# Patient Record
Sex: Female | Born: 1960 | Race: White | Hispanic: No | Marital: Single | Smoking: Current every day smoker
Health system: Southern US, Community
[De-identification: ages and names within clinical notes are randomized; demographics above are authoritative.]

## PROBLEM LIST (undated history)

## (undated) DIAGNOSIS — R490 Dysphonia: Secondary | ICD-10-CM

## (undated) DIAGNOSIS — L089 Local infection of the skin and subcutaneous tissue, unspecified: Principal | ICD-10-CM

## (undated) DIAGNOSIS — Z8709 Personal history of other diseases of the respiratory system: Secondary | ICD-10-CM

## (undated) DIAGNOSIS — R42 Dizziness and giddiness: Secondary | ICD-10-CM

## (undated) DIAGNOSIS — D3500 Benign neoplasm of unspecified adrenal gland: Secondary | ICD-10-CM

## (undated) DIAGNOSIS — M791 Myalgia, unspecified site: Secondary | ICD-10-CM

## (undated) DIAGNOSIS — C8330 Diffuse large B-cell lymphoma, unspecified site: Principal | ICD-10-CM

## (undated) DIAGNOSIS — E079 Disorder of thyroid, unspecified: Secondary | ICD-10-CM

## (undated) DIAGNOSIS — M2550 Pain in unspecified joint: Secondary | ICD-10-CM

## (undated) DIAGNOSIS — M1990 Unspecified osteoarthritis, unspecified site: Secondary | ICD-10-CM

## (undated) DIAGNOSIS — K219 Gastro-esophageal reflux disease without esophagitis: Secondary | ICD-10-CM

## (undated) DIAGNOSIS — M2540 Effusion, unspecified joint: Secondary | ICD-10-CM

## (undated) DIAGNOSIS — J384 Edema of larynx: Secondary | ICD-10-CM

## (undated) DIAGNOSIS — C801 Malignant (primary) neoplasm, unspecified: Secondary | ICD-10-CM

## (undated) DIAGNOSIS — R350 Frequency of micturition: Secondary | ICD-10-CM

## (undated) HISTORY — DX: Diffuse large B-cell lymphoma, unspecified site: C83.30

## (undated) HISTORY — DX: Dysphonia: R49.0

## (undated) HISTORY — PX: BIOPSY THYROID: PRO38

## (undated) HISTORY — PX: LARYNGOSCOPY: SUR817

## (undated) HISTORY — DX: Local infection of the skin and subcutaneous tissue, unspecified: L08.9

## (undated) HISTORY — DX: Disorder of thyroid, unspecified: E07.9

## (undated) HISTORY — DX: Benign neoplasm of unspecified adrenal gland: D35.00

## (undated) HISTORY — DX: Edema of larynx: J38.4

## (undated) HISTORY — PX: LYMPH NODE BIOPSY: SHX201

---

## 1998-07-19 ENCOUNTER — Other Ambulatory Visit: Admission: RE | Admit: 1998-07-19 | Discharge: 1998-07-19 | Payer: Self-pay | Admitting: Obstetrics and Gynecology

## 1999-07-27 ENCOUNTER — Other Ambulatory Visit: Admission: RE | Admit: 1999-07-27 | Discharge: 1999-07-27 | Payer: Self-pay | Admitting: Obstetrics and Gynecology

## 1999-09-07 ENCOUNTER — Other Ambulatory Visit: Admission: RE | Admit: 1999-09-07 | Discharge: 1999-09-07 | Payer: Self-pay | Admitting: Obstetrics and Gynecology

## 1999-09-07 ENCOUNTER — Encounter (INDEPENDENT_AMBULATORY_CARE_PROVIDER_SITE_OTHER): Payer: Self-pay

## 1999-10-29 HISTORY — PX: ABDOMINAL HYSTERECTOMY: SHX81

## 2000-04-23 ENCOUNTER — Inpatient Hospital Stay (HOSPITAL_COMMUNITY): Admission: RE | Admit: 2000-04-23 | Discharge: 2000-04-25 | Payer: Self-pay | Admitting: Obstetrics and Gynecology

## 2000-04-23 ENCOUNTER — Encounter (INDEPENDENT_AMBULATORY_CARE_PROVIDER_SITE_OTHER): Payer: Self-pay

## 2000-08-22 ENCOUNTER — Other Ambulatory Visit: Admission: RE | Admit: 2000-08-22 | Discharge: 2000-08-22 | Payer: Self-pay | Admitting: Obstetrics and Gynecology

## 2003-11-01 ENCOUNTER — Other Ambulatory Visit: Admission: RE | Admit: 2003-11-01 | Discharge: 2003-11-01 | Payer: Self-pay | Admitting: Obstetrics and Gynecology

## 2004-02-27 ENCOUNTER — Other Ambulatory Visit: Admission: RE | Admit: 2004-02-27 | Discharge: 2004-02-27 | Payer: Self-pay | Admitting: Diagnostic Radiology

## 2004-11-07 ENCOUNTER — Other Ambulatory Visit: Admission: RE | Admit: 2004-11-07 | Discharge: 2004-11-07 | Payer: Self-pay | Admitting: Obstetrics and Gynecology

## 2004-12-24 ENCOUNTER — Encounter (INDEPENDENT_AMBULATORY_CARE_PROVIDER_SITE_OTHER): Payer: Self-pay | Admitting: *Deleted

## 2004-12-24 ENCOUNTER — Ambulatory Visit (HOSPITAL_COMMUNITY): Admission: RE | Admit: 2004-12-24 | Discharge: 2004-12-24 | Payer: Self-pay | Admitting: General Surgery

## 2005-11-13 ENCOUNTER — Other Ambulatory Visit: Admission: RE | Admit: 2005-11-13 | Discharge: 2005-11-13 | Payer: Self-pay | Admitting: Obstetrics and Gynecology

## 2006-12-05 ENCOUNTER — Other Ambulatory Visit: Admission: RE | Admit: 2006-12-05 | Discharge: 2006-12-05 | Payer: Self-pay | Admitting: Obstetrics and Gynecology

## 2007-12-15 ENCOUNTER — Other Ambulatory Visit: Admission: RE | Admit: 2007-12-15 | Discharge: 2007-12-15 | Payer: Self-pay | Admitting: Obstetrics and Gynecology

## 2009-05-11 ENCOUNTER — Other Ambulatory Visit
Admission: RE | Admit: 2009-05-11 | Discharge: 2009-05-11 | Payer: Self-pay | Admitting: Physical Medicine & Rehabilitation

## 2010-05-21 ENCOUNTER — Other Ambulatory Visit: Admission: RE | Admit: 2010-05-21 | Discharge: 2010-05-21 | Payer: Self-pay | Admitting: Obstetrics and Gynecology

## 2010-11-18 ENCOUNTER — Encounter: Payer: Self-pay | Admitting: Family Medicine

## 2011-03-15 NOTE — H&P (Signed)
[facility]  Patient:    Alexandra Allen, Alexandra Allen                  MRN: [id number] Adm. Date:  04/23/00 Attending:  Beather Arbour. Thomasena Edis, M.D.                         History and Physical  DATE OF BIRTH:                23-Jun-1961  SS#:                          [id number]  HISTORY OF PRESENT ILLNESS:   The patient is a 50 year old Gravida 0 with a long history of menorrhagia.  The patient has been diagnosed with multiple uterine fibroids.  She most recently presented to the office complaining of something protruding from her vagina at which time she was diagnosed with a prolapsed uterine fibroid.  Menorrhagia has resulted in the patient manifesting anemia.  Her last hemoglobin in the office was 10.8 on iron supplementation.  She is currently taking Micronor and Lupron to control the bleeding.  The patient has been treated conservatively with Micronor but has continued to manifest profuse bleeding.  She had complained of increased bleeding with venipuncture but had coagulation studies performed by Dr. Maple Hudson which were found to be within normal limits.  Risks of surgery including anesthetic complication, hemorrhage, infection, damage to adjacent structures including bladder, bowel, blood vessels or ureters were discussed with the patient.  She was made aware of unforeseen risks.  She was also made aware of postoperative risks to include, DVT, pulmonary embolism, wound infection, urinary tract infection, atelectasis and pneumonia as well as other unforeseen postoperative risks. I have further discussed with her the possibility of vesicovaginal fistula and the etiology of this.  The patient expresses understanding of and acceptance of these risks and desires to proceed with total abdominal hysterectomy for severe menorrhagia uncontrolled with oral contraceptives and with Lupron GNRA agonist.  The patient had been previously treated with a combination of  oral contraceptives but at 37 these were discontinued due to the patients smoking history.  PAST MEDICAL HISTORY:         As above.  SOCIAL HISTORY:               The patient is an Airline pilot.  HABITS:                       The patient smokes 3/4 of a pack of cigarettes per day.  ALLERGIES:                    Septra.  MEDICATIONS:                  Chromagen and Lupron.  FAMILY HISTORY:               History of lung cancer in maternal grandfather and hypertension in the patients father.  Also history of postmenopausal breast cancer in the patients mother.  History of colon cancer in the maternal grandmother.  The patients father also has borderline glucose intolerance.  REVIEW OF SYSTEMS:            Negative.  PHYSICAL EXAMINATION:  HEENT:  Normal.  NECK:                         Supple without thyromegaly.  LUNGS:                        Clear to auscultation.  CARDIAC:                      Regular rate and rhythm.  ABDOMEN:                      Soft, nontender.  No hepatosplenomegaly.  PELVIC:                       BUS normal. Speculum examination reveals prolapsing uterine fibroid.  Uterus is approximately eight weeks and mobile. There are no adnexal masses palpated.  RECTAL:                       Confirmatory.  No mass.  LABORATORY DATA:              Pap smear performed on July 27, 1999 was within normal limits.  Endometrial biopsy performed on September 07, 1999 showed proliferative type endometrium.  ASSESSMENT/PLAN:              The patient is a 50 year old, Gravida 0, with menorrhagia and anemia unresponsive to Micronor oral contraceptive and Lupron. The patient is admitted for a total abdominal hysterectomy.  Decision was made for total abdominal hysterectomy since there is absolutely no descend of the uterus and the prolapsing fibroid would obscure the ability to remove the uterus vaginally.  The patient expressed understanding of and  accepts the risks of surgery. DD:  04/23/00 TD:  04/23/00 Job: [id number]

## 2011-03-15 NOTE — Op Note (Signed)
NAMEKEYONI, Alexandra Allen         ACCOUNT NO.:  [id number]   MEDICAL RECORD NO.:  [id number]          PATIENT TYPE:  AMB   LOCATION:  DAY                          FACILITY:  [facility]   PHYSICIAN:  Sharlet Salina T. Hoxworth, M.D.DATE OF BIRTH:  Nov 26, 1960   DATE OF PROCEDURE:  12/24/2004  DATE OF DISCHARGE:                                 OPERATIVE REPORT   PREOPERATIVE DIAGNOSIS:  Left axillary lymphadenopathy.   POSTOPERATIVE DIAGNOSIS:  Left axillary lymphadenopathy.   SURGICAL PROCEDURE:  Excisional biopsy, deep left axillary lymph node.   SURGEON:  Dr. Johna Sheriff   ANESTHESIA:  Local with IV sedation.   BRIEF HISTORY:  Alexandra Allen is a 50 year old female, who approximately 2  months ago noted a palpable lymph node under her left arm.  She has  undergone an extensive work-up with detailed breast imaging and no other  abnormalities found, although a 1.5 cm enlarged lymph node was seen in the  left axilla.  This has been followed clinically for about 2 months and has  not regressed and after a discussion of options with the patient, we have  elected to proceed with excisional biopsy.  The indications for the  procedure and risks of bleeding, infection, small risk of lymphedema were  discussed and understood.  She is now brought to the operating room for this  procedure.   DESCRIPTION OF OPERATION:  The patient brought to the operating room and  placed in supine position on the operating table, and IV sedation was  administered.  Left arm was carefully positioned, extended on arm board, and  the left chest, axilla, and arm were sterilely prepped and draped.  Local  anesthesia was used to infiltrate the skin and underlying soft tissue in the  left axilla.  A small transverse incision was made and dissection carried  down through the subcutaneous tissue.  Dissection was deepened down to the  clavipectoral fascia which was incised and the axilla proper entered.  The  lymph node was  easily palpable against the chest wall.  Dissection was  carried directly down onto the node which was elevated and then sharply  dissected away from surrounding tissue on the capsule and node.  Dissection  was carried down to the pedicle which was then clamped, divided, and the  specimen removed, the pedicle suture ligated with 3-0 Vicryl.  I then  carefully felt through the incision in the axilla and was not able to feel  any other abnormal lymph nodes.  Hemostasis was assured.  The deep subcu was  closed with a couple of interrupted 3-0 Vicryl.  The skin was closed with  running subcuticular 4-0 Monocryl and Steri-Strips.  Sponge, needle, and  instrument counts were correct.  Dry sterile dressing was applied and the  patient taken to recovery in good condition.      BTH/MEDQ  D:  12/24/2004  T:  12/24/2004  Job:  [id number]

## 2011-03-15 NOTE — Op Note (Signed)
[facility]  Patient:    Alexandra Allen, Alexandra Allen                  MRN: [id number] Proc. Date: 04/23/00 Adm. Date:  [id number] Attending:  Madelyn Flavors                           Operative Report  PREOPERATIVE DIAGNOSIS:       Fibroid uterus, menorrhagia, and anemia. Prolapsing fibroid into the vagina.  POSTOPERATIVE DIAGNOSIS:      Fibroid uterus, menorrhagia, and anemia. Prolapsing fibroid into the vagina.  OPERATION:                    Total abdominal hysterectomy and lysis of adhesions.  SURGEON:                      Beather Arbour. Thomasena Edis, M.D.  ASSISTANT:                    Trevor Iha, M.D.  ANESTHESIA:                   General endotracheal anesthesia.  ESTIMATED BLOOD LOSS:         100 cc.  FLUIDS:                       2100 cc of Crystalloid and 1 gram of Ancef IV preoperative antibiotic.  DRAINS:                       Foley.  COMPLICATIONS:                None.  DESCRIPTION OF PROCEDURE:     The patient was brought to the operating room and  identified on the operating table.  After induction of adequate general endotracheal anesthesia, the patient was placed in the supine position and prepped and draped in the usual sterile fashion.  A Foley catheter was placed.  A Pfannenstiel incision was made and carried down to the fascia.  The fascia was scored in the midline and extended bilaterally using Mayo scissors.  It was then separated free from the underlying muscles.  The muscles were separated in the midline down to the symphysis.  The peritoneum was entered sharply and carefully after placing the patient in Trendelenburg.  Entry into the abdomen was performed carefully to decrease the risk of bowel damage.  The peritoneal incision was extended superiorly and then inferiorly down to the bladder edge.  The OConnor-OSullivan retractor was placed and the bladder blade was placed.  The bowel was packed out of the operative  field.  There were noted to be extensive adhesions of the bowel to the left pelvic side wall and to the left tube and ovary and these were lysed using sharp dissection.  Kelly clamps were placed at either angle of the uterus and the uterus was elevated up to the level of the abdominal incision.  The right round ligament was ligated with a figure-of-eight suture of 0 Monocryl, separated using cautery, and the anterior leaf of the broad ligament as separated down to the area overlying the internal os.  The clear space was developed and the utero-ovarian ligament was clamped with a Heaney, cut, tied with a free tie, and then a suture ligature of 0 Monocryl.  The vessels were skeletonized.  A curved Heaney  was then placed to clamp the uterine vessels which were then cut and suture ligated using a suture of 0 Monocryl.  The bladder was  taken down.  In a similar fashion, the left round ligament was ligated with a figure-of-eight suture of 0 Monocryl, divided, and the anterior leaf of the broad ligament was undermined to the area overlying the internal os.  The bladder was  noted to be well down.  The utero-ovarian ligament pedicle was clamped with a Heaney, cut, tied with a free tie, and then a suture ligature of 0 Monocryl. The vessels were skeletonized on the left and the uterine vessels were clamped with a curved Heaney, cut, and suture ligated using a suture of 0 Monocryl.  After noting that the bladder was well down, successive cardinal uterosacral ligament complex bites were taken using straight Heaney clamps with the pedicle then cut and suture ligated using sutures of 0 Monocryl.  The curved Heaney was then placed across he left uterosacral ligament, clamped, cut, and suture ligated using a suture of 0  Monocryl.  The vagina was then entered.  In a similar fashion, the right uterosacral ligament was clamped with a curved Heaney, cut, and suture ligated using  figure-of-eight suture of 0 Monocryl and the vagina was entered.  The Satinsky scissors were then used to separate the cervix from the vagina and the  uterus was sent to pathology for examination.  There was noted to be a large prolapsed fibroid which was prolapsed into the vagina.  Using figure-of-eight sutures of 0 Monocryl, the vaginal cuff was closed.  Excellent hemostasis was noted at all pedicles and the pelvis was copiously irrigated with warm Ringers lactate. A suture of 0 Monocryl was placed to plicate the uterosacral ligaments and provide further support and to decrease the risk of enterocele.  All pedicle sites were  again examined and they were noted to be extremely hemostatic.  At that point, he procedure was then terminated.  The patient was taken out of Trendelenburg, the  packs packing the bowel out of the operative field were removed.  The OConnor-OSullivan retractor and the bladder blade as well as abdominal wall retractor were removed.  Kelly clamps were placed on the peritoneum and the peritoneum was closed using suture of 0 Monocryl.  The subfascial areas were examined for evidence of hemostasis and excellent hemostasis achieved using cautery.  The fascia was closed using a suture of 0 PDS anchored at the leftmost angle of the incision, run to the rightmost angle of the incision in a simple running fashion and tied.  The subcutaneous tissue was irrigated copiously with  warm Ringers lactate and excellent hemostasis achieved using cautery and skin was closed with staples.  The patient tolerated the procedure well without apparent  complications and was transferred to the recovery room in stable condition after all sponge, needle, and instrument counts were correct. DD:  04/23/00 TD:  04/24/00 Job: [id number]

## 2011-03-15 NOTE — Discharge Summary (Signed)
[facility]  Patient:    Alexandra Allen, Alexandra Allen                  MRN: [id number] Adm. Date:  [id number] Disc. Date: [id number] Attending:  Madelyn Flavors                           Discharge Summary  HISTORY OF PRESENT ILLNESS:   The patient is a 50 year old gravida 0, with a long history of menorrhagia diagnosed with multiple uterine fibroids.  She was subsequently found to have a large prolapsing fibroid.  For further details, please see the dictated history and physical.  HOSPITAL COURSE:              The patient was admitted and subsequently underwent a total abdominal hysterectomy without difficulty.  Postoperatively, she was receiving excellent analgesia and was able to ambulate.  On postoperative day #1, her hemoglobin was 9.7, dressing was clean and dry over the incision.  She was also complaining of some increased gaseous distention for which she received a Dulcolax suppository.  On postoperative day #2, the patient had no complaints, incision was clean and dry.  She had very scant vaginal bleeding and was discharged home.  She was urged to return for an incision check in two weeks.  Given Ultram 50 mg, dispense 30, to take 1-2 p.o. q.4-6h. p.r.n. pain.  She was urged to do no heavy lifting.  Call if heavy vaginal bleeding, temperature greater than 100.5 persistently, or any problems.  PATHOLOGY:                    Pathology returned as prolapsing submucosal endometrial leiomyoma and leiomyomata uteri. DD:  06/02/00 TD:  06/02/00 Job: [id number]

## 2013-08-27 ENCOUNTER — Other Ambulatory Visit: Payer: Self-pay | Admitting: Family Medicine

## 2013-08-30 ENCOUNTER — Other Ambulatory Visit: Payer: Self-pay | Admitting: Family Medicine

## 2013-08-30 DIAGNOSIS — R1909 Other intra-abdominal and pelvic swelling, mass and lump: Secondary | ICD-10-CM

## 2013-09-07 ENCOUNTER — Other Ambulatory Visit: Payer: Self-pay | Admitting: Family Medicine

## 2013-09-07 DIAGNOSIS — E041 Nontoxic single thyroid nodule: Secondary | ICD-10-CM

## 2013-09-08 ENCOUNTER — Other Ambulatory Visit: Payer: Self-pay

## 2013-09-10 ENCOUNTER — Ambulatory Visit
Admission: RE | Admit: 2013-09-10 | Discharge: 2013-09-10 | Disposition: A | Discharge status: Home / Self Care | Payer: BC Managed Care – PPO | Source: Ambulatory Visit | Attending: Family Medicine | Admitting: Family Medicine

## 2013-09-10 DIAGNOSIS — E041 Nontoxic single thyroid nodule: Secondary | ICD-10-CM

## 2013-09-10 DIAGNOSIS — R1909 Other intra-abdominal and pelvic swelling, mass and lump: Secondary | ICD-10-CM

## 2013-09-27 ENCOUNTER — Ambulatory Visit (INDEPENDENT_AMBULATORY_CARE_PROVIDER_SITE_OTHER): Payer: BC Managed Care – PPO | Admitting: Surgery

## 2013-09-27 ENCOUNTER — Ambulatory Visit (INDEPENDENT_AMBULATORY_CARE_PROVIDER_SITE_OTHER): Payer: Self-pay | Admitting: Surgery

## 2013-10-27 ENCOUNTER — Encounter (INDEPENDENT_AMBULATORY_CARE_PROVIDER_SITE_OTHER): Payer: Self-pay

## 2013-11-04 ENCOUNTER — Ambulatory Visit (INDEPENDENT_AMBULATORY_CARE_PROVIDER_SITE_OTHER): Payer: BC Managed Care – PPO | Admitting: General Surgery

## 2013-11-29 ENCOUNTER — Ambulatory Visit (INDEPENDENT_AMBULATORY_CARE_PROVIDER_SITE_OTHER): Payer: BC Managed Care – PPO | Admitting: General Surgery

## 2014-01-03 ENCOUNTER — Ambulatory Visit (INDEPENDENT_AMBULATORY_CARE_PROVIDER_SITE_OTHER): Payer: BC Managed Care – PPO | Admitting: General Surgery

## 2014-02-21 ENCOUNTER — Ambulatory Visit (INDEPENDENT_AMBULATORY_CARE_PROVIDER_SITE_OTHER): Payer: BC Managed Care – PPO | Admitting: General Surgery

## 2014-09-06 ENCOUNTER — Other Ambulatory Visit: Payer: BC Managed Care – PPO

## 2014-09-06 ENCOUNTER — Other Ambulatory Visit: Payer: Self-pay | Admitting: Family Medicine

## 2014-09-06 DIAGNOSIS — R1084 Generalized abdominal pain: Secondary | ICD-10-CM

## 2014-09-06 DIAGNOSIS — R591 Generalized enlarged lymph nodes: Secondary | ICD-10-CM

## 2014-09-08 ENCOUNTER — Ambulatory Visit
Admission: RE | Admit: 2014-09-08 | Discharge: 2014-09-08 | Disposition: A | Discharge status: Home / Self Care | Payer: BC Managed Care – PPO | Source: Ambulatory Visit | Attending: Family Medicine | Admitting: Family Medicine

## 2014-09-08 ENCOUNTER — Other Ambulatory Visit: Payer: BC Managed Care – PPO

## 2014-09-08 DIAGNOSIS — R1084 Generalized abdominal pain: Secondary | ICD-10-CM

## 2014-09-08 DIAGNOSIS — R591 Generalized enlarged lymph nodes: Secondary | ICD-10-CM

## 2014-09-13 ENCOUNTER — Other Ambulatory Visit: Payer: BC Managed Care – PPO

## 2014-09-13 ENCOUNTER — Ambulatory Visit
Admission: RE | Admit: 2014-09-13 | Discharge: 2014-09-13 | Disposition: A | Discharge status: Home / Self Care | Payer: BC Managed Care – PPO | Source: Ambulatory Visit | Attending: Family Medicine | Admitting: Family Medicine

## 2014-09-13 DIAGNOSIS — R591 Generalized enlarged lymph nodes: Secondary | ICD-10-CM

## 2014-09-13 DIAGNOSIS — R1084 Generalized abdominal pain: Secondary | ICD-10-CM

## 2014-09-14 ENCOUNTER — Other Ambulatory Visit: Payer: Self-pay | Admitting: Family Medicine

## 2014-09-14 DIAGNOSIS — R160 Hepatomegaly, not elsewhere classified: Secondary | ICD-10-CM

## 2014-09-21 ENCOUNTER — Ambulatory Visit
Admission: RE | Admit: 2014-09-21 | Discharge: 2014-09-21 | Disposition: A | Discharge status: Home / Self Care | Payer: BC Managed Care – PPO | Source: Ambulatory Visit | Attending: Family Medicine | Admitting: Family Medicine

## 2014-09-21 DIAGNOSIS — R160 Hepatomegaly, not elsewhere classified: Secondary | ICD-10-CM

## 2014-09-21 MED ORDER — GADOBENATE DIMEGLUMINE 529 MG/ML IV SOLN
14.0000 mL | Freq: Once | INTRAVENOUS | Status: AC | PRN
  Administered 2014-09-21: 14 mL via INTRAVENOUS

## 2014-09-30 ENCOUNTER — Other Ambulatory Visit (INDEPENDENT_AMBULATORY_CARE_PROVIDER_SITE_OTHER): Payer: Self-pay | Admitting: General Surgery

## 2014-09-30 DIAGNOSIS — R1909 Other intra-abdominal and pelvic swelling, mass and lump: Secondary | ICD-10-CM

## 2014-10-04 ENCOUNTER — Other Ambulatory Visit (INDEPENDENT_AMBULATORY_CARE_PROVIDER_SITE_OTHER): Payer: Self-pay | Admitting: General Surgery

## 2014-10-19 ENCOUNTER — Other Ambulatory Visit: Payer: Self-pay | Admitting: Radiology

## 2014-10-20 ENCOUNTER — Other Ambulatory Visit: Payer: Self-pay | Admitting: Radiology

## 2014-10-24 ENCOUNTER — Ambulatory Visit (HOSPITAL_COMMUNITY)
Admission: RE | Admit: 2014-10-24 | Discharge: 2014-10-24 | Disposition: A | Discharge status: Home / Self Care | Payer: BC Managed Care – PPO | Source: Ambulatory Visit | Attending: General Surgery | Admitting: General Surgery

## 2014-10-24 ENCOUNTER — Other Ambulatory Visit (INDEPENDENT_AMBULATORY_CARE_PROVIDER_SITE_OTHER): Payer: Self-pay | Admitting: General Surgery

## 2014-10-24 DIAGNOSIS — Z808 Family history of malignant neoplasm of other organs or systems: Secondary | ICD-10-CM | POA: Insufficient documentation

## 2014-10-24 DIAGNOSIS — E042 Nontoxic multinodular goiter: Secondary | ICD-10-CM | POA: Diagnosis not present

## 2014-10-24 DIAGNOSIS — R1909 Other intra-abdominal and pelvic swelling, mass and lump: Secondary | ICD-10-CM

## 2014-10-24 DIAGNOSIS — Z803 Family history of malignant neoplasm of breast: Secondary | ICD-10-CM | POA: Insufficient documentation

## 2014-10-24 DIAGNOSIS — Z8042 Family history of malignant neoplasm of prostate: Secondary | ICD-10-CM | POA: Insufficient documentation

## 2014-10-24 DIAGNOSIS — R590 Localized enlarged lymph nodes: Secondary | ICD-10-CM | POA: Insufficient documentation

## 2014-10-24 DIAGNOSIS — F17210 Nicotine dependence, cigarettes, uncomplicated: Secondary | ICD-10-CM | POA: Insufficient documentation

## 2014-10-24 DIAGNOSIS — E079 Disorder of thyroid, unspecified: Secondary | ICD-10-CM | POA: Diagnosis present

## 2014-10-24 LAB — CBC
HCT: 44.4 % (ref 36.0–46.0)
Hemoglobin: 14.8 g/dL (ref 12.0–15.0)
MCH: 31.6 pg (ref 26.0–34.0)
MCHC: 33.3 g/dL (ref 30.0–36.0)
MCV: 94.9 fL (ref 78.0–100.0)
PLATELETS: 267 10*3/uL (ref 150–400)
RBC: 4.68 MIL/uL (ref 3.87–5.11)
RDW: 14 % (ref 11.5–15.5)
WBC: 5.7 10*3/uL (ref 4.0–10.5)

## 2014-10-24 LAB — PROTIME-INR
INR: 0.89 (ref 0.00–1.49)
Prothrombin Time: 12.1 seconds (ref 11.6–15.2)

## 2014-10-24 LAB — APTT: APTT: 31 s (ref 24–37)

## 2014-10-24 MED ORDER — SODIUM CHLORIDE 0.9 % IV SOLN
INTRAVENOUS | Status: DC
  Administered 2014-10-24: 09:00:00 via INTRAVENOUS

## 2014-10-24 MED ORDER — LIDOCAINE HCL (PF) 1 % IJ SOLN
INTRAMUSCULAR | Status: AC
  Filled 2014-10-24: qty 10

## 2014-10-24 NOTE — H&P (Signed)
Chief Complaint: "I'm here for a biopsy"  Referring Physician(s): Allen,Alexandra  History of Present Illness: .  Past Medical History  Diagnosis Date  . Thyroid disease     multiple nodules    Past Surgical History  Procedure Laterality Date  . Abdominal hysterectomy  2001    Allergies: Review of patient's allergies indicates not on file.  Medications: Prior to Admission medications   Medication Sig Start Date End Date Taking? Authorizing Provider  Glucosamine-Chondroit-Vit C-Mn (GLUCOSAMINE CHONDR 1500 COMPLX PO) Take by mouth.    Historical Provider, MD  ibuprofen (ADVIL) 200 MG tablet Take 200 mg by mouth every 6 (six) hours as needed.    Historical Provider, MD  Multiple Vitamins-Minerals (MULTIVITAMIN PO) Take by mouth daily.    Historical Provider, MD  promethazine-codeine (PHENERGAN WITH CODEINE) 6.25-10 MG/5ML syrup Take by mouth at bedtime as needed for cough.    Historical Provider, MD    Family History  Problem Relation Age of Onset  . Cancer Mother     breast  . Hypertension Mother   . Cancer Father     colon, skin and prostate  . Hypertension Father   . Heart disease Father     History   Social History  . Marital Status: Single    Spouse Name: N/A    Number of Children: N/A  . Years of Education: N/A   Social History Main Topics  . Smoking status: Current Every Day Smoker -- 1.50 packs/day    Types: Cigarettes  . Smokeless tobacco: Not on file  . Alcohol Use: Yes     Comment: 1-2 drinks daily  . Drug Use: No  . Sexual Activity: Yes   Other Topics Concern  . Not on file   Social History Narrative  . No narrative on file      Review of Systems  Constitutional: Negative for fever and chills.  Respiratory: Positive for cough. Negative for shortness of breath.   Cardiovascular: Negative for chest pain.  Gastrointestinal: Negative for nausea, vomiting and blood in stool.       Occ mild abd pain  Genitourinary: Negative for dysuria  and hematuria.  Musculoskeletal:       Occ back pain  Neurological: Positive for headaches.  Hematological: Does not bruise/bleed easily.    Vital Signs: BP 117/64 mmHg  Pulse 68  Temp(Src) 98.2 F (36.8 C) (Oral)  Resp 18  Ht 5' 3.75" (1.619 m)  Wt 115 lb (52.164 kg)  BMI 19.90 kg/m2  SpO2 100%  Physical Exam  Constitutional: She is oriented to person, place, and time. She appears well-developed and well-nourished.  Cardiovascular: Normal rate and regular rhythm.   Pulmonary/Chest: Effort normal and breath sounds normal.  Abdominal: Soft. Bowel sounds are normal. There is no tenderness.  Palpable,NT left groin ST/? nodal mass left groin region  Musculoskeletal: Normal range of motion. She exhibits no edema.  Neurological: She is alert and oriented to person, place, and time.    Imaging: No results found.  Labs:  CBC:  Recent Labs  10/24/14 0838  WBC 5.7  HGB 14.8  HCT 44.4  PLT 267    COAGS:  Recent Labs  10/24/14 0838  INR 0.89  APTT 31    BMP: No results for input(s): NA, K, CL, CO2, GLUCOSE, BUN, CALCIUM, CREATININE, GFRNONAA, GFRAA in the last 8760 hours.  Invalid input(s): CMP  LIVER FUNCTION TESTS: No results for input(s): BILITOT, AST, ALT, ALKPHOS, PROT, ALBUMIN in the last  8760 hours.  TUMOR MARKERS: No results for input(s): AFPTM, CEA, CA199, CHROMGRNA in the last 8760 hours.  Assessment and Plan: Alexandra Allen is a 53 y.o. female smoker with history of slightly enlarging/mildly hypervascular soft tissue mass in left groin region who presents today for US guided biopsy. She denies personal hx of cancer but family hx positive for breast/colon/prostate/skin cancers. Details/risks of procedure d/w pt with her understanding and consent.      SignedAutumn Allen 10/24/2014, 9:46 AM

## 2014-10-24 NOTE — Procedures (Signed)
Interventional Radiology Procedure Note  Procedure: US guided left inguinal lymph node biopsy  Complications: None Recommendations: - routine care - follow up bx result  Signed,  Dulcy Fanny. Earleen Newport, DO

## 2014-10-28 DIAGNOSIS — C801 Malignant (primary) neoplasm, unspecified: Secondary | ICD-10-CM

## 2014-10-28 HISTORY — DX: Malignant (primary) neoplasm, unspecified: C80.1

## 2014-11-01 ENCOUNTER — Telehealth (INDEPENDENT_AMBULATORY_CARE_PROVIDER_SITE_OTHER): Payer: Self-pay | Admitting: General Surgery

## 2014-11-01 NOTE — Telephone Encounter (Signed)
Left message to call us tomorrow about pathology.    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  She will need oncology referral and additional imaging.  Did not leave specifics on the voice mail.

## 2014-11-08 ENCOUNTER — Telehealth: Payer: Self-pay | Admitting: Hematology and Oncology

## 2014-11-08 NOTE — Telephone Encounter (Signed)
S/W PATIENT AND GAVE NP APPT FOR 01/13 @ 11:45 W/DR. Nicholls

## 2014-11-09 ENCOUNTER — Ambulatory Visit (HOSPITAL_BASED_OUTPATIENT_CLINIC_OR_DEPARTMENT_OTHER): Payer: BLUE CROSS/BLUE SHIELD | Admitting: Hematology and Oncology

## 2014-11-09 ENCOUNTER — Ambulatory Visit: Payer: BLUE CROSS/BLUE SHIELD

## 2014-11-09 ENCOUNTER — Telehealth: Payer: Self-pay | Admitting: Hematology and Oncology

## 2014-11-09 ENCOUNTER — Encounter: Payer: Self-pay | Admitting: Hematology and Oncology

## 2014-11-09 ENCOUNTER — Other Ambulatory Visit (INDEPENDENT_AMBULATORY_CARE_PROVIDER_SITE_OTHER): Payer: Self-pay | Admitting: General Surgery

## 2014-11-09 VITALS — BP 137/88 | HR 68 | Temp 98.9°F | Resp 18 | Ht 63.75 in | Wt 152.4 lb

## 2014-11-09 DIAGNOSIS — Z720 Tobacco use: Secondary | ICD-10-CM

## 2014-11-09 DIAGNOSIS — Z8572 Personal history of non-Hodgkin lymphomas: Secondary | ICD-10-CM | POA: Insufficient documentation

## 2014-11-09 DIAGNOSIS — C8330 Diffuse large B-cell lymphoma, unspecified site: Secondary | ICD-10-CM

## 2014-11-09 HISTORY — DX: Diffuse large B-cell lymphoma, unspecified site: C83.30

## 2014-11-09 NOTE — Telephone Encounter (Signed)
gv and printed appt sched and avs fo rpt for Jan and Feb....sed added tx.Marland KitchenMarland KitchenMarland KitchenMarland Kitchenemialed LD for echo preauth

## 2014-11-10 ENCOUNTER — Telehealth: Payer: Self-pay | Admitting: Hematology and Oncology

## 2014-11-10 DIAGNOSIS — Z720 Tobacco use: Secondary | ICD-10-CM | POA: Insufficient documentation

## 2014-11-10 NOTE — Progress Notes (Signed)
[facility] NOTE  Patient Care Team: Jonathon Bellows, MD as PCP - General (Family Medicine)  CHIEF COMPLAINTS/PURPOSE OF CONSULTATION:  Newly diagnosed diffuse large B-cell lymphoma  HISTORY OF PRESENTING ILLNESS:  Alexandra Allen 54 y.o. female is here because of recent diagnosis of lymphoma. This patient had palpable lymphadenopathy since June 2014. She had noted this and wash lymph node in her groin and over the past year and a half has fluctuated in size. It does not cause pain. The patient has a history of vocal cord inflammation and was evaluated before with no malignancy found. She also has abnormal lymphadenopathy in the left armpit 8 years ago that was biopsied and came back negative. She was found to have abnormal ultrasound imaging study which led to ultimate referral to see a surgeon. Due to abnormal lymph node, ultrasound-guided biopsy was arranged and she was found to lymphoma. She denies anorexia, weight loss or abnormal weight loss.  MEDICAL HISTORY:  Past Medical History  Diagnosis Date  . Thyroid disease     multiple nodules  . Vocal cord edema   . Diffuse large B cell lymphoma 11/09/2014    SURGICAL HISTORY: Past Surgical History  Procedure Laterality Date  . Abdominal hysterectomy  2001  . Lymph node biopsy    . Laryngoscopy    . Biopsy thyroid      SOCIAL HISTORY: History   Social History  . Marital Status: Single    Spouse Name: N/A    Number of Children: N/A  . Years of Education: N/A   Occupational History  . Not on file.   Social History Main Topics  . Smoking status: Current Every Day Smoker -- 0.75 packs/day for 35 years    Types: Cigarettes  . Smokeless tobacco: Never Used  . Alcohol Use: Yes     Comment: 1-2 drinks daily  . Drug Use: No  . Sexual Activity: Yes   Other Topics Concern  . Not on file   Social History Narrative    FAMILY HISTORY: Family History  Problem Relation Age of Onset  . Cancer  Mother     breast and then died of endometrial cancer  . Hypertension Mother   . Cancer Father     colon, skin and prostate  . Hypertension Father   . Heart disease Father     ALLERGIES:  is allergic to influenza vaccines; prednisone; and sulfa antibiotics.  MEDICATIONS:  Current Outpatient Prescriptions  Medication Sig Dispense Refill  . Glucosamine-Chondroit-Vit C-Mn (GLUCOSAMINE CHONDR 1500 COMPLX PO) Take by mouth.    Marland Kitchen ibuprofen (ADVIL) 200 MG tablet Take 200 mg by mouth every 6 (six) hours as needed.    . Multiple Vitamins-Minerals (MULTIVITAMIN PO) Take by mouth daily.    . promethazine-codeine (PHENERGAN WITH CODEINE) 6.25-10 MG/5ML syrup Take by mouth at bedtime as needed for cough.    Marland Kitchen UNABLE TO FIND Med Name: Supplement "DGL"     No current facility-administered medications for this visit.    REVIEW OF SYSTEMS:   Constitutional: Denies fevers, chills or abnormal night sweats Eyes: Denies blurriness of vision, double vision or watery eyes Ears, nose, mouth, throat, and face: Denies mucositis or sore throat Respiratory: Denies cough, dyspnea or wheezes Cardiovascular: Denies palpitation, chest discomfort or lower extremity swelling Gastrointestinal:  Denies nausea, heartburn or change in bowel habits Skin: Denies abnormal skin rashes Neurological:Denies numbness, tingling or new weaknesses Behavioral/Psych: Mood is stable, no new changes  All other systems were  reviewed with the patient and are negative.  PHYSICAL EXAMINATION: ECOG PERFORMANCE STATUS: 0 - Asymptomatic  Filed Vitals:   11/09/14 1212  BP: 137/88  Pulse: 68  Temp: 98.9 F (37.2 C)  Resp: 18   Filed Weights   11/09/14 1212  Weight: 152 lb 6.4 oz (69.128 kg)    GENERAL:alert, no distress and comfortable SKIN: skin color, texture, turgor are normal, no rashes or significant lesions EYES: normal, conjunctiva are pink and non-injected, sclera clear OROPHARYNX:no exudate, no erythema and lips,  buccal mucosa, and tongue normal  NECK: supple, thyroid normal size, non-tender, without nodularity LYMPH:  Well healed surgical scar in the left groin. Palpable lymphadenopathy noted in both groin area LUNGS: clear to auscultation and percussion with normal breathing effort HEART: regular rate & rhythm and no murmurs and no lower extremity edema ABDOMEN:abdomen soft, non-tender and normal bowel sounds Musculoskeletal:no cyanosis of digits and no clubbing  PSYCH: alert & oriented x 3 with fluent speech. Noted hoarseness NEURO: no focal motor/sensory deficits  LABORATORY DATA:  I have reviewed the data as listed Lab Results  Component Value Date   WBC 5.7 10/24/2014   HGB 14.8 10/24/2014   HCT 44.4 10/24/2014   MCV 94.9 10/24/2014   PLT 267 10/24/2014   No results for input(s): NA, K, CL, CO2, GLUCOSE, BUN, CREATININE, CALCIUM, GFRNONAA, GFRAA, PROT, ALBUMIN, AST, ALT, ALKPHOS, BILITOT, BILIDIR, IBILI in the last 8760 hours.  RADIOGRAPHIC STUDIES: I have personally reviewed the radiological images as listed and agreed with the findings in the report. US Biopsy  10/24/2014   CLINICAL DATA:  54 year old female with a history of left inguinal adenopathy. She has been referred for biopsy.  EXAM: ULTRASOUND GUIDED CORE BIOPSY OF LEFT INGUINAL LYMPH NODE  MEDICATIONS: Zero mg IV Versed; 0 mcg IV Fentanyl  Total Moderate Sedation Time: 0  PROCEDURE: The procedure, risks, benefits, and alternatives were explained to the patient. Questions regarding the procedure were encouraged and answered. The patient understands and consents to the procedure.  Ultrasound survey of the left inguinal region was performed with images stored and sent to PACs.  The left inguinal region was prepped with Betadine in a sterile fashion, and a sterile drape was applied covering the operative field. A sterile gown and sterile gloves were used for the procedure. Local anesthesia was provided with 1% Lidocaine.  Once the  patient is prepped and draped sterilely and the region was thoroughly infiltrated with 1% lidocaine for local anesthesia, small incision was made with 11 blade scalpel, and a 18 gauge trocar gun was advanced under ultrasound guidance into the lymph node. Four separate core biopsy were retrieved. Two of these were sent in saline solution for cytology, and 2 core biopsy were sent in formalin.  The patient tolerated the procedure well and remained hemodynamically stable throughout.  No complications were encountered and no significant blood loss was encounter.  Final ultrasound image demonstrates no complicating features.  COMPLICATIONS: None.  FINDINGS: Enlarged left inguinal lymph node, with maintained architecture.  Images during the case demonstrate needle within the lesion on each pass.  Final ultrasound images demonstrates no complicating features.  IMPRESSION: Status post ultrasound-guided biopsy of left inguinal lymph node. Tissue specimen sent to pathology for complete histopathologic analysis.  Signed,  Dulcy Fanny. Earleen Newport, DO  [facility]  [facility]   Electronically Signed   By: Allen Mckusick D.O.   On: 10/24/2014 13:28    ASSESSMENT & PLAN:  Diffuse large  B cell lymphoma I had extensive discussion with the patient and her sister. We discussed the importance of staging. She would need PET CT scan and bone marrow biopsy. She would also need chemotherapy education class, echocardiogram, port placement and to start chemotherapy as soon as possible. Due to scheduling issues, she elected to have her chemotherapy to start on Fabry third. I will see her before that to review everything and to consent her for treatment.   Tobacco abuse I spent some time counseling the patient the importance of tobacco cessation. she is currently attempting to quit on her own      All questions were answered. The patient knows to call the clinic with any problems,  questions or concerns. I spent 40 minutes counseling the patient face to face. The total time spent in the appointment was 60 minutes and more than 50% was on counseling.     [facility], [place], MD 11/10/2014 2:09 PM

## 2014-11-10 NOTE — Telephone Encounter (Signed)
s.w. pt and advised on echo on 1.19.Marland KitchenMarland KitchenMarland Kitchenper Roena Malady # [id number]

## 2014-11-10 NOTE — Assessment & Plan Note (Signed)
I had extensive discussion with the patient and her sister. We discussed the importance of staging. She would need PET CT scan and bone marrow biopsy. She would also need chemotherapy education class, echocardiogram, port placement and to start chemotherapy as soon as possible. Due to scheduling issues, she elected to have her chemotherapy to start on Fabry third. I will see her before that to review everything and to consent her for treatment.

## 2014-11-10 NOTE — Assessment & Plan Note (Signed)
I spent some time counseling the patient the importance of tobacco cessation. she is currently attempting to quit on her own 

## 2014-11-15 ENCOUNTER — Encounter: Payer: Self-pay | Admitting: *Deleted

## 2014-11-15 ENCOUNTER — Telehealth: Payer: Self-pay | Admitting: *Deleted

## 2014-11-15 ENCOUNTER — Other Ambulatory Visit: Payer: BLUE CROSS/BLUE SHIELD

## 2014-11-15 ENCOUNTER — Telehealth: Payer: Self-pay | Admitting: Hematology and Oncology

## 2014-11-15 ENCOUNTER — Ambulatory Visit (HOSPITAL_COMMUNITY)
Admission: RE | Admit: 2014-11-15 | Discharge: 2014-11-15 | Disposition: A | Discharge status: Home / Self Care | Payer: BLUE CROSS/BLUE SHIELD | Source: Ambulatory Visit | Attending: Hematology and Oncology | Admitting: Hematology and Oncology

## 2014-11-15 DIAGNOSIS — C8330 Diffuse large B-cell lymphoma, unspecified site: Secondary | ICD-10-CM | POA: Diagnosis present

## 2014-11-15 DIAGNOSIS — I059 Rheumatic mitral valve disease, unspecified: Secondary | ICD-10-CM

## 2014-11-15 NOTE — Telephone Encounter (Signed)
PT. MENTIONED BEING PUT TO SLEEP OR NOT FOR BONE MARROW. NOTE TO DR.GORSUCH'S NURSE, CAMEO [facility].

## 2014-11-15 NOTE — Telephone Encounter (Signed)
Discussed sedated vs unsedated BMBX w/ pt.  She prefers to have Un-sedated biopsy done.  She is going out of town from 1/28 thru 2/1.  She asks if she can have it done on 2/1 or 2/2.   Per Dr. Alvy Bimler it needs to be done sooner on 1/25, 1/27 or 1/28.   Pt has PET scan scheduled for 1/25.   Scheduled BMBx for 1/27.   Notified Butch Penny in [employer].  Date and time given pt.  She verbalized understanding.

## 2014-11-15 NOTE — Progress Notes (Signed)
  Echocardiogram 2D Echocardiogram has been performed.  Bobbye Charleston 11/15/2014, 11:58 AM

## 2014-11-15 NOTE — Telephone Encounter (Signed)
added appt per verbal on 1.27 per Cameo....she will contact pt with d.t

## 2014-11-15 NOTE — Pre-Procedure Instructions (Signed)
Alexandra Allen  11/15/2014   Your procedure is scheduled on:  Thurs, Jan 21 @ 11:00 AM  Report to [facility]  at 9:00 AM.  Call this number if you have problems the morning of surgery: [phone]   Remember:   Do not eat food or drink liquids after midnight.   Take these medicines the morning of surgery with A SIP OF WATER              No Goody's,BC's,Aleve,Aspirin,Ibuprofen,Fish Oil,or any Herbal Medications   Do not wear jewelry, make-up or nail polish.  Do not wear lotions, powders, or perfumes. You may wear deodorant.  Do not shave 48 hours prior to surgery.   Do not bring valuables to the hospital.  [facility] is not responsible                  for any belongings or valuables.               Contacts, dentures or bridgework may not be worn into surgery.  Leave suitcase in the car. After surgery it may be brought to your room.  For patients admitted to the hospital, discharge time is determined by your                treatment team.               Patients discharged the day of surgery will not be allowed to drive  home.    Special Instructions:  [facility] - Preparing for Surgery  Before surgery, you can play an important role.  Because skin is not sterile, your skin needs to be as free of germs as possible.  You can reduce the number of germs on you skin by washing with CHG (chlorahexidine gluconate) soap before surgery.  CHG is an antiseptic cleaner which kills germs and bonds with the skin to continue killing germs even after washing.  Please DO NOT use if you have an allergy to CHG or antibacterial soaps.  If your skin becomes reddened/irritated stop using the CHG and inform your nurse when you arrive at Short Stay.  Do not shave (including legs and underarms) for at least 48 hours prior to the first CHG shower.  You may shave your face.  Please follow these instructions carefully:   1.  Shower with CHG Soap the night before surgery and the                                 morning of Surgery.  2.  If you choose to wash your hair, wash your hair first as usual with your       normal shampoo.  3.  After you shampoo, rinse your hair and body thoroughly to remove the                      Shampoo.  4.  Use CHG as you would any other liquid soap.  You can apply chg directly       to the skin and wash gently with scrungie or a clean washcloth.  5.  Apply the CHG Soap to your body ONLY FROM THE NECK DOWN.        Do not use on open wounds or open sores.  Avoid contact with your eyes,       ears, mouth and genitals (private parts).  Wash genitals (private  parts)       with your normal soap.  6.  Wash thoroughly, paying special attention to the area where your surgery        will be performed.  7.  Thoroughly rinse your body with warm water from the neck down.  8.  DO NOT shower/wash with your normal soap after using and rinsing off       the CHG Soap.  9.  Pat yourself dry with a clean towel.            10.  Wear clean pajamas.            11.  Place clean sheets on your bed the night of your first shower and do not        sleep with pets.  Day of Surgery  Do not apply any lotions/deoderants the morning of surgery.  Please wear clean clothes to the hospital/surgery center.     Please read over the following fact sheets that you were given: Pain Booklet, Coughing and Deep Breathing and Surgical Site Infection Prevention

## 2014-11-16 ENCOUNTER — Encounter (HOSPITAL_COMMUNITY): Payer: Self-pay

## 2014-11-16 ENCOUNTER — Other Ambulatory Visit: Payer: Self-pay | Admitting: Hematology and Oncology

## 2014-11-16 ENCOUNTER — Encounter (HOSPITAL_COMMUNITY)
Admission: RE | Admit: 2014-11-16 | Discharge: 2014-11-16 | Disposition: A | Discharge status: Home / Self Care | Payer: BLUE CROSS/BLUE SHIELD | Source: Ambulatory Visit | Attending: General Surgery | Admitting: General Surgery

## 2014-11-16 DIAGNOSIS — Z882 Allergy status to sulfonamides status: Secondary | ICD-10-CM | POA: Diagnosis not present

## 2014-11-16 DIAGNOSIS — C8590 Non-Hodgkin lymphoma, unspecified, unspecified site: Secondary | ICD-10-CM

## 2014-11-16 DIAGNOSIS — C8595 Non-Hodgkin lymphoma, unspecified, lymph nodes of inguinal region and lower limb: Secondary | ICD-10-CM | POA: Diagnosis present

## 2014-11-16 DIAGNOSIS — F17210 Nicotine dependence, cigarettes, uncomplicated: Secondary | ICD-10-CM | POA: Diagnosis not present

## 2014-11-16 DIAGNOSIS — M1990 Unspecified osteoarthritis, unspecified site: Secondary | ICD-10-CM | POA: Diagnosis not present

## 2014-11-16 DIAGNOSIS — K219 Gastro-esophageal reflux disease without esophagitis: Secondary | ICD-10-CM | POA: Diagnosis not present

## 2014-11-16 DIAGNOSIS — E079 Disorder of thyroid, unspecified: Secondary | ICD-10-CM | POA: Diagnosis not present

## 2014-11-16 HISTORY — DX: Dizziness and giddiness: R42

## 2014-11-16 HISTORY — DX: Frequency of micturition: R35.0

## 2014-11-16 HISTORY — DX: Pain in unspecified joint: M25.50

## 2014-11-16 HISTORY — DX: Dysphonia: R49.0

## 2014-11-16 HISTORY — DX: Personal history of other diseases of the respiratory system: Z87.09

## 2014-11-16 HISTORY — DX: Myalgia, unspecified site: M79.10

## 2014-11-16 HISTORY — DX: Gastro-esophageal reflux disease without esophagitis: K21.9

## 2014-11-16 HISTORY — DX: Unspecified osteoarthritis, unspecified site: M19.90

## 2014-11-16 HISTORY — DX: Effusion, unspecified joint: M25.40

## 2014-11-16 LAB — COMPREHENSIVE METABOLIC PANEL
ALT: 23 U/L (ref 0–35)
AST: 23 U/L (ref 0–37)
Albumin: 4 g/dL (ref 3.5–5.2)
Alkaline Phosphatase: 40 U/L (ref 39–117)
Anion gap: 11 (ref 5–15)
BUN: 12 mg/dL (ref 6–23)
CO2: 27 mmol/L (ref 19–32)
Calcium: 10 mg/dL (ref 8.4–10.5)
Chloride: 103 mEq/L (ref 96–112)
Creatinine, Ser: 0.87 mg/dL (ref 0.50–1.10)
GFR calc Af Amer: 88 mL/min — ABNORMAL LOW (ref >90)
GFR, EST NON AFRICAN AMERICAN: 76 mL/min — AB (ref >90)
GLUCOSE: 101 mg/dL — AB (ref 70–99)
Potassium: 4.5 mmol/L (ref 3.5–5.1)
SODIUM: 141 mmol/L (ref 135–145)
TOTAL PROTEIN: 6.8 g/dL (ref 6.0–8.3)
Total Bilirubin: 0.6 mg/dL (ref 0.3–1.2)

## 2014-11-16 LAB — CBC
HCT: 43 % (ref 36.0–46.0)
Hemoglobin: 14.7 g/dL (ref 12.0–15.0)
MCH: 32.4 pg (ref 26.0–34.0)
MCHC: 34.2 g/dL (ref 30.0–36.0)
MCV: 94.7 fL (ref 78.0–100.0)
PLATELETS: 295 10*3/uL (ref 150–400)
RBC: 4.54 MIL/uL (ref 3.87–5.11)
RDW: 13.7 % (ref 11.5–15.5)
WBC: 6.7 10*3/uL (ref 4.0–10.5)

## 2014-11-16 MED ORDER — CEFAZOLIN SODIUM-DEXTROSE 2-3 GM-% IV SOLR
2.0000 g | INTRAVENOUS | Status: AC
  Administered 2014-11-17: 2 g via INTRAVENOUS
  Filled 2014-11-16: qty 50

## 2014-11-16 NOTE — Progress Notes (Signed)
Pt doesn't have a cardiologist  Echo in epic from 11-15-14  Denies ever having a stress test/heart cath   Medical MD is Dr.Carol Justin Mend  Denies ekg OR cxr in past yr

## 2014-11-17 ENCOUNTER — Ambulatory Visit (HOSPITAL_COMMUNITY): Payer: BLUE CROSS/BLUE SHIELD

## 2014-11-17 ENCOUNTER — Ambulatory Visit (HOSPITAL_COMMUNITY): Payer: BLUE CROSS/BLUE SHIELD | Admitting: Anesthesiology

## 2014-11-17 ENCOUNTER — Ambulatory Visit (HOSPITAL_COMMUNITY)
Admission: RE | Admit: 2014-11-17 | Discharge: 2014-11-17 | Disposition: A | Discharge status: Home / Self Care | Payer: BLUE CROSS/BLUE SHIELD | Source: Ambulatory Visit | Attending: General Surgery | Admitting: General Surgery

## 2014-11-17 ENCOUNTER — Encounter (HOSPITAL_COMMUNITY)
Admission: RE | Disposition: A | Discharge status: Home / Self Care | Payer: Self-pay | Source: Ambulatory Visit | Attending: General Surgery

## 2014-11-17 ENCOUNTER — Encounter (HOSPITAL_COMMUNITY): Payer: Self-pay | Admitting: *Deleted

## 2014-11-17 DIAGNOSIS — C8590 Non-Hodgkin lymphoma, unspecified, unspecified site: Secondary | ICD-10-CM

## 2014-11-17 DIAGNOSIS — C8595 Non-Hodgkin lymphoma, unspecified, lymph nodes of inguinal region and lower limb: Secondary | ICD-10-CM | POA: Insufficient documentation

## 2014-11-17 DIAGNOSIS — K219 Gastro-esophageal reflux disease without esophagitis: Secondary | ICD-10-CM | POA: Insufficient documentation

## 2014-11-17 DIAGNOSIS — F17210 Nicotine dependence, cigarettes, uncomplicated: Secondary | ICD-10-CM | POA: Insufficient documentation

## 2014-11-17 DIAGNOSIS — Z882 Allergy status to sulfonamides status: Secondary | ICD-10-CM | POA: Insufficient documentation

## 2014-11-17 DIAGNOSIS — E079 Disorder of thyroid, unspecified: Secondary | ICD-10-CM | POA: Insufficient documentation

## 2014-11-17 DIAGNOSIS — Z95828 Presence of other vascular implants and grafts: Secondary | ICD-10-CM

## 2014-11-17 DIAGNOSIS — M1990 Unspecified osteoarthritis, unspecified site: Secondary | ICD-10-CM | POA: Insufficient documentation

## 2014-11-17 HISTORY — PX: PORTACATH PLACEMENT: SHX2246

## 2014-11-17 SURGERY — INSERTION, TUNNELED CENTRAL VENOUS DEVICE, WITH PORT
Anesthesia: General | Site: Chest | Laterality: Left

## 2014-11-17 MED ORDER — SODIUM CHLORIDE 0.9 % IR SOLN
Status: DC | PRN
  Administered 2014-11-17: 500 mL

## 2014-11-17 MED ORDER — 0.9 % SODIUM CHLORIDE (POUR BTL) OPTIME
TOPICAL | Status: DC | PRN
  Administered 2014-11-17: 1000 mL

## 2014-11-17 MED ORDER — HEPARIN SOD (PORK) LOCK FLUSH 100 UNIT/ML IV SOLN
INTRAVENOUS | Status: AC
  Filled 2014-11-17: qty 5

## 2014-11-17 MED ORDER — ACETAMINOPHEN 650 MG RE SUPP
650.0000 mg | RECTAL | Status: DC | PRN
  Filled 2014-11-17: qty 1

## 2014-11-17 MED ORDER — LIDOCAINE HCL (CARDIAC) 20 MG/ML IV SOLN
INTRAVENOUS | Status: DC | PRN
  Administered 2014-11-17: 80 mg via INTRAVENOUS

## 2014-11-17 MED ORDER — KETOROLAC TROMETHAMINE 30 MG/ML IJ SOLN
INTRAMUSCULAR | Status: AC
  Filled 2014-11-17: qty 1

## 2014-11-17 MED ORDER — PROPOFOL 10 MG/ML IV BOLUS
INTRAVENOUS | Status: AC
  Filled 2014-11-17: qty 20

## 2014-11-17 MED ORDER — ACETAMINOPHEN 325 MG PO TABS
650.0000 mg | ORAL_TABLET | ORAL | Status: DC | PRN
  Filled 2014-11-17: qty 2

## 2014-11-17 MED ORDER — LACTATED RINGERS IV SOLN
INTRAVENOUS | Status: DC | PRN
  Administered 2014-11-17 (×2): via INTRAVENOUS

## 2014-11-17 MED ORDER — PROPOFOL 10 MG/ML IV BOLUS
INTRAVENOUS | Status: DC | PRN
  Administered 2014-11-17: 200 mg via INTRAVENOUS

## 2014-11-17 MED ORDER — ONDANSETRON HCL 4 MG/2ML IJ SOLN
INTRAMUSCULAR | Status: DC | PRN
  Administered 2014-11-17: 4 mg via INTRAVENOUS

## 2014-11-17 MED ORDER — LIDOCAINE HCL 1 % IJ SOLN
INTRAMUSCULAR | Status: DC | PRN
  Administered 2014-11-17: 40 mL

## 2014-11-17 MED ORDER — ONDANSETRON HCL 4 MG/2ML IJ SOLN
INTRAMUSCULAR | Status: AC
  Filled 2014-11-17: qty 2

## 2014-11-17 MED ORDER — HEPARIN SOD (PORK) LOCK FLUSH 100 UNIT/ML IV SOLN
INTRAVENOUS | Status: DC | PRN
  Administered 2014-11-17: 500 [IU]

## 2014-11-17 MED ORDER — FENTANYL CITRATE 0.05 MG/ML IJ SOLN
INTRAMUSCULAR | Status: DC | PRN
  Administered 2014-11-17 (×3): 50 ug via INTRAVENOUS

## 2014-11-17 MED ORDER — FENTANYL CITRATE 0.05 MG/ML IJ SOLN
INTRAMUSCULAR | Status: AC
  Filled 2014-11-17: qty 2

## 2014-11-17 MED ORDER — ROCURONIUM BROMIDE 50 MG/5ML IV SOLN
INTRAVENOUS | Status: AC
  Filled 2014-11-17: qty 1

## 2014-11-17 MED ORDER — LACTATED RINGERS IV SOLN
INTRAVENOUS | Status: DC
  Administered 2014-11-17: 10:00:00 via INTRAVENOUS

## 2014-11-17 MED ORDER — OXYCODONE-ACETAMINOPHEN 5-325 MG PO TABS: 1.0000 | ORAL_TABLET | ORAL | Status: DC | PRN

## 2014-11-17 MED ORDER — LIDOCAINE HCL (PF) 1 % IJ SOLN
INTRAMUSCULAR | Status: AC
  Filled 2014-11-17: qty 30

## 2014-11-17 MED ORDER — MIDAZOLAM HCL 5 MG/5ML IJ SOLN
INTRAMUSCULAR | Status: DC | PRN
  Administered 2014-11-17: 2 mg via INTRAVENOUS

## 2014-11-17 MED ORDER — MIDAZOLAM HCL 2 MG/2ML IJ SOLN
INTRAMUSCULAR | Status: AC
  Filled 2014-11-17: qty 2

## 2014-11-17 MED ORDER — PROMETHAZINE HCL 25 MG/ML IJ SOLN: 6.2500 mg | INTRAMUSCULAR | Status: DC | PRN

## 2014-11-17 MED ORDER — LIDOCAINE HCL (CARDIAC) 20 MG/ML IV SOLN
INTRAVENOUS | Status: AC
  Filled 2014-11-17: qty 5

## 2014-11-17 MED ORDER — FENTANYL CITRATE 0.05 MG/ML IJ SOLN
INTRAMUSCULAR | Status: AC
  Filled 2014-11-17: qty 5

## 2014-11-17 MED ORDER — OXYCODONE HCL 5 MG PO TABS: 5.0000 mg | ORAL_TABLET | ORAL | Status: DC | PRN

## 2014-11-17 MED ORDER — BUPIVACAINE-EPINEPHRINE (PF) 0.25% -1:200000 IJ SOLN
INTRAMUSCULAR | Status: AC
  Filled 2014-11-17: qty 30

## 2014-11-17 MED ORDER — FENTANYL CITRATE 0.05 MG/ML IJ SOLN
25.0000 ug | INTRAMUSCULAR | Status: DC | PRN
  Administered 2014-11-17 (×3): 50 ug via INTRAVENOUS

## 2014-11-17 MED ORDER — KETOROLAC TROMETHAMINE 30 MG/ML IJ SOLN
30.0000 mg | Freq: Once | INTRAMUSCULAR | Status: AC | PRN
  Administered 2014-11-17: 30 mg via INTRAVENOUS

## 2014-11-17 SURGICAL SUPPLY — 50 items
ADH SKN CLS APL DERMABOND .7 (GAUZE/BANDAGES/DRESSINGS) ×1
BAG DECANTER FOR FLEXI CONT (MISCELLANEOUS) ×2 IMPLANT
BLADE SURG 11 STRL SS (BLADE) ×2 IMPLANT
BLADE SURG 15 STRL LF DISP TIS (BLADE) ×1 IMPLANT
BLADE SURG 15 STRL SS (BLADE) ×2
CANISTER SUCTION 2500CC (MISCELLANEOUS) IMPLANT
CHLORAPREP W/TINT 10.5 ML (MISCELLANEOUS) ×2 IMPLANT
COVER SURGICAL LIGHT HANDLE (MISCELLANEOUS) ×2 IMPLANT
COVER TRANSDUCER ULTRASND (DRAPES) IMPLANT
CRADLE DONUT ADULT HEAD (MISCELLANEOUS) ×2 IMPLANT
DECANTER SPIKE VIAL GLASS SM (MISCELLANEOUS) ×4 IMPLANT
DERMABOND ADVANCED (GAUZE/BANDAGES/DRESSINGS) ×1
DERMABOND ADVANCED .7 DNX12 (GAUZE/BANDAGES/DRESSINGS) IMPLANT
DRAPE C-ARM 42X72 X-RAY (DRAPES) ×2 IMPLANT
DRAPE CHEST BREAST 15X10 FENES (DRAPES) ×2 IMPLANT
DRAPE UTILITY XL STRL (DRAPES) ×4 IMPLANT
DRAPE WARM FLUID 44X44 (DRAPE) IMPLANT
ELECT COATED BLADE 2.86 ST (ELECTRODE) ×2 IMPLANT
ELECT REM PT RETURN 9FT ADLT (ELECTROSURGICAL) ×2
ELECTRODE REM PT RTRN 9FT ADLT (ELECTROSURGICAL) ×1 IMPLANT
GAUZE SPONGE 4X4 16PLY XRAY LF (GAUZE/BANDAGES/DRESSINGS) ×2 IMPLANT
GLOVE BIO SURGEON STRL SZ 6 (GLOVE) ×2 IMPLANT
GLOVE BIOGEL PI IND STRL 6.5 (GLOVE) ×1 IMPLANT
GLOVE BIOGEL PI INDICATOR 6.5 (GLOVE) ×1
GOWN STRL REUS W/ TWL LRG LVL3 (GOWN DISPOSABLE) ×1 IMPLANT
GOWN STRL REUS W/TWL 2XL LVL3 (GOWN DISPOSABLE) ×2 IMPLANT
GOWN STRL REUS W/TWL LRG LVL3 (GOWN DISPOSABLE) ×2
KIT BASIN OR (CUSTOM PROCEDURE TRAY) ×2 IMPLANT
KIT PORT POWER 8FR ISP CVUE (Catheter) ×1 IMPLANT
KIT POWER CATH 8FR (Catheter) IMPLANT
KIT ROOM TURNOVER OR (KITS) ×2 IMPLANT
LIQUID BAND (GAUZE/BANDAGES/DRESSINGS) ×2 IMPLANT
NDL HYPO 25GX1X1/2 BEV (NEEDLE) ×1 IMPLANT
NEEDLE HYPO 25GX1X1/2 BEV (NEEDLE) ×2 IMPLANT
NS IRRIG 1000ML POUR BTL (IV SOLUTION) ×2 IMPLANT
PACK SURGICAL SETUP 50X90 (CUSTOM PROCEDURE TRAY) ×2 IMPLANT
PAD ARMBOARD 7.5X6 YLW CONV (MISCELLANEOUS) ×2 IMPLANT
PENCIL BUTTON HOLSTER BLD 10FT (ELECTRODE) ×2 IMPLANT
SUT MON AB 4-0 PC3 18 (SUTURE) ×2 IMPLANT
SUT PROLENE 2 0 SH DA (SUTURE) ×4 IMPLANT
SUT VIC AB 3-0 SH 27 (SUTURE) ×2
SUT VIC AB 3-0 SH 27X BRD (SUTURE) ×1 IMPLANT
SYR 20ML ECCENTRIC (SYRINGE) ×4 IMPLANT
SYR 5ML LUER SLIP (SYRINGE) ×2 IMPLANT
SYR CONTROL 10ML LL (SYRINGE) ×2 IMPLANT
TOWEL OR 17X24 6PK STRL BLUE (TOWEL DISPOSABLE) ×2 IMPLANT
TOWEL OR 17X26 10 PK STRL BLUE (TOWEL DISPOSABLE) ×2 IMPLANT
TUBE CONNECTING 12X1/4 (SUCTIONS) IMPLANT
WATER STERILE IRR 1000ML POUR (IV SOLUTION) IMPLANT
YANKAUER SUCT BULB TIP NO VENT (SUCTIONS) IMPLANT

## 2014-11-17 NOTE — Transfer of Care (Signed)
Immediate Anesthesia Transfer of Care Note  Patient: Alexandra Allen  Procedure(s) Performed: Procedure(s): INSERTION PORT-A-CATH LEFT SUBCLAVIAN (Left)  Patient Location: PACU  Anesthesia Type:General  Level of Consciousness: awake, alert  and oriented  Airway & Oxygen Therapy: Patient Spontanous Breathing and Patient connected to nasal cannula oxygen  Post-op Assessment: Report given to PACU RN, Post -op Vital signs reviewed and stable and Patient moving all extremities  Post vital signs: Reviewed and stable  Complications: No apparent anesthesia complications

## 2014-11-17 NOTE — Anesthesia Preprocedure Evaluation (Addendum)
Anesthesia Evaluation  Patient identified by MRN, date of birth, ID band Patient awake    Reviewed: Allergy & Precautions, NPO status , Patient's Chart, lab work & pertinent test results  Airway Mallampati: II  TM Distance: >3 FB Neck ROM: Full    Dental no notable dental hx. (+) Teeth Intact, Dental Advisory Given   Pulmonary Current Smoker,  breath sounds clear to auscultation  Pulmonary exam normal       Cardiovascular negative cardio ROS  Rhythm:Regular Rate:Normal  03/01/68 ECHO: Systolic function wasnormal. EF 60-65%. Wall motion was normal; there were no regional wall motion abnormalities. Mild MR.   T   Neuro/Psych negative neurological ROS  negative psych ROS   GI/Hepatic negative GI ROS, Neg liver ROS, GERD-  Controlled,8cm hemangioma on liver   Endo/Other  Adrenal adenomas bilatera  Renal/GU negative Renal ROS  negative genitourinary   Musculoskeletal negative musculoskeletal ROS (+)   Abdominal   Peds negative pediatric ROS (+)  Hematology lymphoma   Anesthesia Other Findings   Reproductive/Obstetrics negative OB ROS                           Anesthesia Physical Anesthesia Plan  ASA: II  Anesthesia Plan: General   Post-op Pain Management:    Induction: Intravenous  Airway Management Planned: LMA  Additional Equipment:   Intra-op Plan:   Post-operative Plan: Extubation in OR  Informed Consent: I have reviewed the patients History and Physical, chart, labs and discussed the procedure including the risks, benefits and alternatives for the proposed anesthesia with the patient or authorized representative who has indicated his/her understanding and acceptance.   Dental advisory given  Plan Discussed with: CRNA and Surgeon  Anesthesia Plan Comments:         Anesthesia Quick Evaluation

## 2014-11-17 NOTE — H&P (Signed)
Alexandra Allen is an 54 y.o. female.   Chief Complaint: lymphoma HPI:  Pt presented with 2 years of enlarged left inguinal lymph nodes.  Core needle biopsy was positive for lymphoma.  She saw oncology and is going to get chemotherapy.    Past Medical History  Diagnosis Date  . Thyroid disease     multiple nodules  . Vocal cord edema   . 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 History of bronchitis   . Hoarseness   . Lightheaded     occasionally   . Arthritis   . Myalgia   . Joint pain   . Joint swelling   . GERD (gastroesophageal reflux disease)     takes DGL(over the counter meds)  . Urinary frequency     Past Surgical History  Procedure Laterality Date  . Abdominal hysterectomy  2001  . Lymph node biopsy      under left arm and groin biopsy  . Laryngoscopy    . Biopsy thyroid      Family History  Problem Relation Age of Onset  . Cancer Mother     breast and then died of endometrial cancer  . Hypertension Mother   . Cancer Father     colon, skin and prostate  . Hypertension Father   . Heart disease Father    Social History:  reports that she has been smoking Cigarettes.  She has a 17.5 pack-year smoking history. She has never used smokeless tobacco. She reports that she drinks alcohol. She reports that she does not use illicit drugs.  Allergies:  Allergies  Allergen Reactions  . Adhesive [Tape]     rash  . Influenza Vaccines Other (See Comments)    Joint pain and aches   . Prednisone Other (See Comments)    Severe Headache   . Sulfa Antibiotics Rash    Medications Prior to Admission  Medication Sig Dispense Refill  . ibuprofen (ADVIL) 200 MG tablet Take 200 mg by mouth every 6 (six) hours as needed.    . Multiple Vitamins-Minerals (MULTIVITAMIN PO) Take by mouth daily.    Marland Kitchen UNABLE TO FIND Take 1 tablet by mouth at bedtime. Med Name: Supplement "DGL"    .  Glucosamine-Chondroit-Vit C-Mn (GLUCOSAMINE CHONDR 1500 COMPLX PO) Take 1 tablet by mouth daily.       Results for orders placed or performed during the hospital encounter of 11/16/14 (from the past 48 hour(s))  CBC     Status: None   Collection Time: 11/16/14  9:49 AM  Result Value Ref Range   WBC 6.7 4.0 - 10.5 K/uL   RBC 4.54 3.87 - 5.11 MIL/uL   Hemoglobin 14.7 12.0 - 15.0 g/dL   HCT 43.0 36.0 - 46.0 %   MCV 94.7 78.0 - 100.0 fL   MCH 32.4 26.0 - 34.0 pg   MCHC 34.2 30.0 - 36.0 g/dL   RDW 13.7 11.5 - 15.5 %   Platelets 295 150 - 400 K/uL  Comprehensive metabolic panel     Status: Abnormal   Collection Time: 11/16/14  9:49 AM  Result Value Ref Range   Sodium 141 135 - 145 mmol/L    Comment: Please note change in reference range.   Potassium 4.5 3.5 - 5.1 mmol/L    Comment: Please note change in reference range.   Chloride 103 96 - 112 mEq/L   CO2 27 19 -  32 mmol/L   Glucose, Bld 101 (H) 70 - 99 mg/dL   BUN 12 6 - 23 mg/dL   Creatinine, Ser 0.87 0.50 - 1.10 mg/dL   Calcium 10.0 8.4 - 10.5 mg/dL   Total Protein 6.8 6.0 - 8.3 g/dL   Albumin 4.0 3.5 - 5.2 g/dL   AST 23 0 - 37 U/L   ALT 23 0 - 35 U/L   Alkaline Phosphatase 40 39 - 117 U/L   Total Bilirubin 0.6 0.3 - 1.2 mg/dL   GFR calc non Af Amer 76 (L) >90 mL/min   GFR calc Af Amer 88 (L) >90 mL/min    Comment: (NOTE) The eGFR has been calculated using the CKD EPI equation. This calculation has not been validated in all clinical situations. eGFR's persistently <90 mL/min signify possible Chronic Kidney Disease.    Anion gap 11 5 - 15   No results found.  Review of Systems  All other systems reviewed and are negative.   Blood pressure 113/69, pulse 65, temperature 98.4 F (36.9 C), temperature source Oral, resp. rate 18, height 5' 3.75" (1.619 m), weight 154 lb (69.854 kg), SpO2 100 %. Physical Exam  Constitutional: She is oriented to person, place, and time. She appears well-developed and well-nourished.  HENT:   Head: Normocephalic and atraumatic.  Eyes: Pupils are equal, round, and reactive to light.  Neck: Normal range of motion.  Cardiovascular: Normal rate.   Respiratory: Effort normal.  GI: Soft.  Musculoskeletal: Normal range of motion.  Lymphadenopathy:       Left: Inguinal adenopathy present.  Neurological: She is alert and oriented to person, place, and time.  Skin: Skin is warm and dry.  Psychiatric: She has a normal mood and affect. Her behavior is normal. Judgment and thought content normal.     Assessment/Plan Diffuse B cell lymphoma. Port a cath for chemotherapy. Discussed procedure, risks and benefits.    Elven Laboy 11/17/2014, 11:45 AM

## 2014-11-17 NOTE — Anesthesia Procedure Notes (Signed)
Procedure Name: LMA Insertion Date/Time: 11/17/2014 12:17 PM Performed by: Luciana Axe K Pre-anesthesia Checklist: Patient identified, Emergency Drugs available, Suction available, Patient being monitored and Timeout performed Patient Re-evaluated:Patient Re-evaluated prior to inductionOxygen Delivery Method: Circle system utilized Preoxygenation: Pre-oxygenation with 100% oxygen Intubation Type: IV induction Ventilation: Mask ventilation without difficulty LMA: LMA inserted LMA Size: 4.0 Number of attempts: 1 Placement Confirmation: positive ETCO2,  CO2 detector and breath sounds checked- equal and bilateral Tube secured with: Tape Dental Injury: Teeth and Oropharynx as per pre-operative assessment

## 2014-11-17 NOTE — Op Note (Signed)
PREOPERATIVE DIAGNOSIS:  lymphoma     POSTOPERATIVE DIAGNOSIS:  Same     PROCEDURE: Left subclavian port placement, Bard ClearVue  Power Port, MRI safe, 8-French.      SURGEON:  Stark Klein, MD      ANESTHESIA:  General   FINDINGS:  Good venous return, easy flush, and tip of the catheter and   SVC 23 cm.      SPECIMEN:  None.      ESTIMATED BLOOD LOSS:  Minimal.      COMPLICATIONS:  None known.      PROCEDURE:  Pt was identified in the holding area and taken to   the operating room, where patient was placed supine on the operating room   table.  General anesthesia was induced.  Patient's arms were tucked and the upper   chest and neck were prepped and draped in sterile fashion.  Time-out was   performed according to the surgical safety check list.  When all was   correct, we continued.   Local anesthetic was administered over this   area at the angle of the clavicle.  The vein was accessed with 1 pass of the needle. There was good venous return and the wire passed easily with no ectopy.   Fluoroscopy was used to confirm that the wire was in the vena cava.      The patient was placed back level and the area for the pocket was anethetized   with local anesthetic.  A 3-cm transverse incision was made with a #15   blade.  Cautery was used to divide the subcutaneous tissues down to the   pectoralis muscle.  An [employer] retractor was used to elevate the skin   while a pocket was created on top of the pectoralis fascia.  The port   was placed into the pocket to confirm that it was of adequate size.  The   catheter was preattached to the port.  The port was then secured to the   pectoralis fascia with four 2-0 Prolene sutures.  These were clamped and   not tied down yet.    The catheter was tunneled through to the wire exit   site.  The catheter was placed along the wire to determine what length it should be to be in the SVC.  The catheter was cut at 23 cm.  The tunneler sheath and  dilator were passed over the wire and the dilator and wire were removed.  The catheter was advanced through the tunneler sheath and the tunneler sheath was pulled away.  Care was taken to keep the catheter in the tunneler sheath as this occurred. This was advanced and the tunneler sheath was removed.  There was good venous   return and easy flush of the catheter.  The Prolene sutures were tied   down to the pectoral fascia.  The skin was reapproximated using 3-0   Vicryl interrupted deep dermal sutures.    Fluoroscopy was used to re-confirm good position of the catheter.  The skin   was then closed using 4-0 Monocryl in a subcuticular fashion.  The port was flushed with concentrated heparin flush as well.  The wounds were then cleaned, dried, and dressed with Dermabond.  The patient was awakened from anesthesia and taken to the PACU in stable condition.  Needle, sponge, and instrument counts were correct.               Stark Klein, MD

## 2014-11-17 NOTE — Discharge Instructions (Addendum)
[facility] Office Phone Number [phone]   POST OP INSTRUCTIONS  Always review your discharge instruction sheet given to you by the facility where your surgery was performed.  IF YOU HAVE DISABILITY OR FAMILY LEAVE FORMS, YOU MUST BRING THEM TO THE OFFICE FOR PROCESSING.  DO NOT GIVE THEM TO YOUR DOCTOR.  1. A prescription for pain medication may be given to you upon discharge.  Take your pain medication as prescribed, if needed.  If narcotic pain medicine is not needed, then you may take acetaminophen (Tylenol) or ibuprofen (Advil) as needed. 2. Take your usually prescribed medications unless otherwise directed 3. If you need a refill on your pain medication, please contact your pharmacy.  They will contact our office to request authorization.  Prescriptions will not be filled after 5pm or on week-ends. 4. You should eat very light the first 24 hours after surgery, such as soup, crackers, pudding, etc.  Resume your normal diet the day after surgery 5. It is common to experience some constipation if taking pain medication after surgery.  Increasing fluid intake and taking a stool softener will usually help or prevent this problem from occurring.  A mild laxative (Milk of Magnesia or Miralax) should be taken according to package directions if there are no bowel movements after 48 hours. 6. You may shower in 48 hours.  The surgical glue will flake off in 2-3 weeks.   7. ACTIVITIES:  No strenuous activity or heavy lifting for 1 week.   a. You may drive when you no longer are taking prescription pain medication, you can comfortably wear a seatbelt, and you can safely maneuver your car and apply brakes. b. RETURN TO WORK:  __________as tolerated as long as no lifting for 1 week._______________ You should see your doctor in the office for a follow-up appointment approximately three-four weeks after your surgery.    WHEN TO CALL YOUR DOCTOR: 1. Fever over 101.0 2. Nausea and/or  vomiting. 3. Extreme swelling or bruising. 4. Continued bleeding from incision. 5. Increased pain, redness, or drainage from the incision.  The clinic staff is available to answer your questions during regular business hours.  Please dont hesitate to call and ask to speak to one of the nurses for clinical concerns.  If you have a medical emergency, go to the nearest emergency room or call 911.  A surgeon from [facility] is always on call at the hospital.  For further questions, please visit [email or web address]    What to eat:  For your first meals, you should eat lightly; only small meals initially.  If you do not have nausea, you may eat larger meals.  Avoid spicy, greasy and heavy food.

## 2014-11-18 ENCOUNTER — Encounter (HOSPITAL_COMMUNITY): Payer: Self-pay | Admitting: General Surgery

## 2014-11-21 ENCOUNTER — Telehealth: Payer: Self-pay | Admitting: *Deleted

## 2014-11-21 ENCOUNTER — Ambulatory Visit (HOSPITAL_COMMUNITY): Payer: BLUE CROSS/BLUE SHIELD

## 2014-11-21 ENCOUNTER — Telehealth: Payer: Self-pay | Admitting: Hematology and Oncology

## 2014-11-21 NOTE — Telephone Encounter (Signed)
I spoke with the patient over the telephone. I clarify with her that staging bone marrow biopsy is a standard of care and we should not have insurance problem getting reimbursement to do the procedure. I recommend we keep the bone marrow biopsy as scheduled in 2 days time.

## 2014-11-21 NOTE — Telephone Encounter (Signed)
MESSAGE LEFT ON VM IN TRIAGE  " I am calling because I think I have a scheduling conflict- "  " BMBX AND PET SCAN- which one should come first? "  Return call number as [phone].  THIS MESSAGE WILL BE SENT TO DESK RN PER INQUIRY FOR PT TO BE CONTACTED.

## 2014-11-21 NOTE — Telephone Encounter (Signed)
Dr. Alvy Bimler called pt about her PET and BMBx appts..   She instructs to leave schedule as it is.

## 2014-11-23 ENCOUNTER — Other Ambulatory Visit (HOSPITAL_COMMUNITY)
Admission: RE | Admit: 2014-11-23 | Discharge: 2014-11-23 | Disposition: A | Discharge status: Home / Self Care | Payer: BLUE CROSS/BLUE SHIELD | Source: Ambulatory Visit | Attending: Hematology and Oncology | Admitting: Hematology and Oncology

## 2014-11-23 ENCOUNTER — Other Ambulatory Visit (HOSPITAL_BASED_OUTPATIENT_CLINIC_OR_DEPARTMENT_OTHER): Payer: BLUE CROSS/BLUE SHIELD

## 2014-11-23 ENCOUNTER — Ambulatory Visit (HOSPITAL_BASED_OUTPATIENT_CLINIC_OR_DEPARTMENT_OTHER): Payer: BLUE CROSS/BLUE SHIELD | Admitting: Hematology and Oncology

## 2014-11-23 ENCOUNTER — Other Ambulatory Visit: Payer: Self-pay | Admitting: Hematology and Oncology

## 2014-11-23 VITALS — BP 124/81 | HR 69 | Temp 98.7°F | Resp 18

## 2014-11-23 DIAGNOSIS — C8330 Diffuse large B-cell lymphoma, unspecified site: Secondary | ICD-10-CM

## 2014-11-23 DIAGNOSIS — C8590 Non-Hodgkin lymphoma, unspecified, unspecified site: Secondary | ICD-10-CM | POA: Insufficient documentation

## 2014-11-23 LAB — CBC WITH DIFFERENTIAL/PLATELET
BASO%: 0.8 % (ref 0.0–2.0)
Basophils Absolute: 0.1 10*3/uL (ref 0.0–0.1)
EOS%: 3.2 % (ref 0.0–7.0)
Eosinophils Absolute: 0.2 10*3/uL (ref 0.0–0.5)
HCT: 48.8 % — ABNORMAL HIGH (ref 34.8–46.6)
HEMOGLOBIN: 15.7 g/dL (ref 11.6–15.9)
LYMPH#: 1.8 10*3/uL (ref 0.9–3.3)
LYMPH%: 27.6 % (ref 14.0–49.7)
MCH: 31.6 pg (ref 25.1–34.0)
MCHC: 32.1 g/dL (ref 31.5–36.0)
MCV: 98.2 fL (ref 79.5–101.0)
MONO#: 0.4 10*3/uL (ref 0.1–0.9)
MONO%: 6.6 % (ref 0.0–14.0)
NEUT#: 4.1 10*3/uL (ref 1.5–6.5)
NEUT%: 61.8 % (ref 38.4–76.8)
Platelets: 267 10*3/uL (ref 145–400)
RBC: 4.97 10*6/uL (ref 3.70–5.45)
RDW: 13.9 % (ref 11.2–14.5)
WBC: 6.6 10*3/uL (ref 3.9–10.3)

## 2014-11-23 LAB — COMPREHENSIVE METABOLIC PANEL (CC13)
ALT: 30 U/L (ref 0–55)
ANION GAP: 13 meq/L — AB (ref 3–11)
AST: 20 U/L (ref 5–34)
Albumin: 4.4 g/dL (ref 3.5–5.0)
Alkaline Phosphatase: 50 U/L (ref 40–150)
BUN: 16.1 mg/dL (ref 7.0–26.0)
CHLORIDE: 107 meq/L (ref 98–109)
CO2: 21 mEq/L — ABNORMAL LOW (ref 22–29)
Calcium: 9.9 mg/dL (ref 8.4–10.4)
Creatinine: 0.8 mg/dL (ref 0.6–1.1)
EGFR: 85 mL/min/{1.73_m2} — AB (ref >90)
Glucose: 106 mg/dl (ref 70–140)
Potassium: 4.5 mEq/L (ref 3.5–5.1)
Sodium: 142 mEq/L (ref 136–145)
Total Bilirubin: 0.4 mg/dL (ref 0.20–1.20)
Total Protein: 7.8 g/dL (ref 6.4–8.3)

## 2014-11-23 LAB — BONE MARROW EXAM

## 2014-11-23 LAB — HEPATITIS B CORE ANTIBODY, IGM: Hep B C IgM: NONREACTIVE

## 2014-11-23 LAB — HEPATITIS B SURFACE ANTIBODY,QUALITATIVE: Hep B S Ab: NEGATIVE

## 2014-11-23 LAB — URIC ACID (CC13): Uric Acid, Serum: 5.9 mg/dl (ref 2.6–7.4)

## 2014-11-23 LAB — LACTATE DEHYDROGENASE (CC13): LDH: 222 U/L (ref 125–245)

## 2014-11-23 LAB — HEPATITIS B SURFACE ANTIGEN: HEP B S AG: NEGATIVE

## 2014-11-23 NOTE — Patient Instructions (Signed)
Bone Marrow Aspiration, Bone Marrow Biopsy  Care After  Read the instructions outlined below and refer to this sheet in the next few weeks. These discharge instructions provide you with general information on caring for yourself after you leave the hospital. Your caregiver may also give you specific instructions. While your treatment has been planned according to the most current medical practices available, unavoidable complications occasionally occur. If you have any problems or questions after discharge, call your caregiver.  FINDING OUT THE RESULTS OF YOUR TEST  Not all test results are available during your visit. If your test results are not back during the visit, make an appointment with your caregiver to find out the results. Do not assume everything is normal if you have not heard from your caregiver or the medical facility. It is important for you to follow up on all of your test results.   HOME CARE INSTRUCTIONS   You have had sedation and may be sleepy or dizzy. Your thinking may not be as clear as usual. For the next 24 hours:  · Only take over-the-counter or prescription medicines for pain, discomfort, and or fever as directed by your caregiver.  · Do not drink alcohol.  · Do not smoke.  · Do not drive.  · Do not make important legal decisions.  · Do not operate heavy machinery.  · Do not care for small children by yourself.  · Keep your dressing clean and dry. You may replace dressing with a bandage after 24 hours.  · You may take a bath or shower after 24 hours.  · Use an ice pack for 20 minutes every 2 hours while awake for pain as needed.  SEEK MEDICAL CARE IF:   · There is redness, swelling, or increasing pain at the biopsy site.  · There is pus coming from the biopsy site.  · There is drainage from a biopsy site lasting longer than one day.  · An unexplained oral temperature above 102° F (38.9° C) develops.  SEEK IMMEDIATE MEDICAL CARE IF:   · You develop a rash.  · You have difficulty  breathing.  · You develop any reaction or side effects to medications given.  Document Released: 05/03/2005 Document Revised: 01/06/2012 Document Reviewed: 10/11/2008  ExitCare® Patient Information ©2015 [employer]. This information is not intended to replace advice given to you by your health care provider. Make sure you discuss any questions you have with your health care provider.

## 2014-11-23 NOTE — Progress Notes (Signed)
Pt stable at discharge. Ambulatory and in no distress. Vital signs stable. Dressing clean, dry, and intact.

## 2014-11-23 NOTE — Progress Notes (Signed)
She is doing well. She had port placement without complication. She is anxious about treatment. I reviewed her chest x-ray and confirmed the port is in position. She is ready for bone marrow biopsy for staging.  Brief examination was performed. ENT: adequate airway clearance Heart: regular rate and rhythm.No Murmurs Lungs: clear to auscultation, no wheezes, normal respiratory effort  Bone Marrow Biopsy and Aspiration Procedure Note   Informed consent was obtained and potential risks including bleeding, infection and pain were reviewed with the patient.  The patient's name, date of birth, identification, consent and allergies were verified prior to the start of procedure and time out was performed.  The right posterior iliac crest was chosen as the site of biopsy.  The skin was prepped with Betadine solution.   8 cc of 1% lidocaine was used to provide local anaesthesia.   10 cc of bone marrow aspirate was obtained followed by 1 inch biopsy.   The procedure was tolerated well and there were no complications.  The patient was stable at the end of the procedure.  Specimens sent for flow cytometry, cytogenetics and additional studies.  Spent 30 minutes on the procedure

## 2014-11-28 ENCOUNTER — Ambulatory Visit (HOSPITAL_BASED_OUTPATIENT_CLINIC_OR_DEPARTMENT_OTHER): Payer: BLUE CROSS/BLUE SHIELD | Admitting: Hematology and Oncology

## 2014-11-28 ENCOUNTER — Other Ambulatory Visit: Payer: Self-pay | Admitting: Hematology and Oncology

## 2014-11-28 ENCOUNTER — Telehealth: Payer: Self-pay | Admitting: Hematology and Oncology

## 2014-11-28 ENCOUNTER — Telehealth: Payer: Self-pay | Admitting: *Deleted

## 2014-11-28 VITALS — BP 134/87 | HR 67 | Temp 98.3°F | Resp 18 | Ht 63.75 in | Wt 154.4 lb

## 2014-11-28 DIAGNOSIS — Z720 Tobacco use: Secondary | ICD-10-CM

## 2014-11-28 DIAGNOSIS — C8330 Diffuse large B-cell lymphoma, unspecified site: Secondary | ICD-10-CM

## 2014-11-28 LAB — CHROMOSOME ANALYSIS, BONE MARROW

## 2014-11-28 MED ORDER — PREDNISONE 20 MG PO TABS: 40.0000 mg | ORAL_TABLET | Freq: Every day | ORAL | Status: DC

## 2014-11-28 MED ORDER — ONDANSETRON HCL 8 MG PO TABS: 8.0000 mg | ORAL_TABLET | Freq: Three times a day (TID) | ORAL | Status: DC | PRN

## 2014-11-28 MED ORDER — PROCHLORPERAZINE MALEATE 10 MG PO TABS: 10.0000 mg | ORAL_TABLET | Freq: Four times a day (QID) | ORAL | Status: DC | PRN

## 2014-11-28 MED ORDER — ALLOPURINOL 300 MG PO TABS: 300.0000 mg | ORAL_TABLET | Freq: Every day | ORAL | Status: DC

## 2014-11-28 NOTE — Assessment & Plan Note (Signed)
I spent some time counseling the patient the importance of tobacco cessation. She is currently not interested to quit now. 

## 2014-11-28 NOTE — Assessment & Plan Note (Signed)
We discussed the role of chemotherapy. The intent is for cure.  We discussed some of the risks, benefits and side-effects of Rituximab,Cytoxan, Adriamycin,Vincristine and Solumedrol/Prednisone.   Some of the short term side-effects included, though not limited to, risk of fatigue, weight loss, tumor lysis syndrome, risk of allergic reactions, pancytopenia, life-threatening infections, need for transfusions of blood products, nausea, vomiting, change in bowel habits, hair loss, risk of congestive heart failure, admission to hospital for various reasons, and risks of death.   Long term side-effects are also discussed including permanent damage to nerve function, chronic fatigue, and rare secondary malignancy including bone marrow disorders.   The patient is aware that the response rates discussed earlier is not guaranteed.    After a long discussion, patient made an informed decision to proceed with the prescribed plan of care and went ahead to sign the consent form today.   Patient education material was dispensed

## 2014-11-28 NOTE — Telephone Encounter (Signed)
Per staff message and POF I have scheduled appts. Advised scheduler of appts. JMW  

## 2014-11-28 NOTE — Telephone Encounter (Signed)
Pt confirmed labs/ov per 02/01 POF, gave pt AVS.... KJ, sent msg to add Cycle 2 RCHOP....Marland KitchenMarland Kitchen

## 2014-11-28 NOTE — Progress Notes (Signed)
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   11/23/2014 Bone Marrow Biopsy Accession: [id number] is negative for lymphoma    INTERVAL HISTORY: Please see below for problem oriented charting. She returns today to discuss further about treatment. PET CT scan was rescheduled until tomorrow. She is feeling well.  REVIEW OF SYSTEMS:   Constitutional: Denies fevers, chills or abnormal weight loss Eyes: Denies blurriness of vision Ears, nose, mouth, throat, and face: Denies mucositis or sore throat Respiratory: Denies cough, dyspnea or wheezes Cardiovascular: Denies palpitation, chest discomfort or lower extremity swelling Gastrointestinal:  Denies nausea, heartburn or change in bowel habits Skin: Denies abnormal skin rashes Lymphatics: Denies new lymphadenopathy or easy bruising Neurological:Denies numbness, tingling or new weaknesses Behavioral/Psych: Mood is stable, no new changes  All other systems were reviewed with the patient and are negative.  I have reviewed the past medical history, past surgical history, social history and family history with the patient and they are unchanged from previous note.  ALLERGIES:  is  allergic to adhesive; influenza vaccines; prednisone; and sulfa antibiotics.  MEDICATIONS:  Current Outpatient Prescriptions  Medication Sig Dispense Refill  . Glucosamine-Chondroit-Vit C-Mn (GLUCOSAMINE CHONDR 1500 COMPLX PO) Take 1 tablet by mouth daily.     Marland Kitchen HYDROcodone-acetaminophen (NORCO) 7.5-325 MG per tablet Take 7.5 tablets by mouth.  0  . ibuprofen (ADVIL) 200 MG tablet Take 200 mg by mouth every 6 (six) hours as needed.    . Multiple Vitamins-Minerals (MULTIVITAMIN PO) Take by mouth daily.    Marland Kitchen oxyCODONE-acetaminophen (ROXICET) 5-325 MG per tablet Take 1-2 tablets by mouth every 4 (four) hours as needed for severe pain. 30 tablet 0  . UNABLE TO FIND Take 1 tablet by mouth at bedtime. Med Name: Supplement "DGL"    . allopurinol (ZYLOPRIM) 300 MG tablet Take 1 tablet (300 mg total) by mouth daily. 30 tablet 0  . ondansetron (ZOFRAN) 8 MG tablet Take 1 tablet (8 mg total) by mouth every 8 (eight) hours as needed. 30 tablet 1  . predniSONE (DELTASONE) 20 MG tablet Take 2 tablets (40 mg total) by mouth daily. Take on days 2-5 of chemotherapy. 48 tablet 0  . prochlorperazine (COMPAZINE) 10 MG tablet Take 1 tablet (10 mg total) by mouth every 6 (six) hours as needed (Nausea or vomiting). 30 tablet 6   No current facility-administered medications for this visit.    PHYSICAL EXAMINATION: ECOG PERFORMANCE STATUS: 0 - Asymptomatic  Filed Vitals:   11/28/14 1110  BP: 134/87  Pulse: 67  Temp: 98.3 F (36.8 C)  Resp: 18   Filed Weights   11/28/14 1110  Weight: 154 lb 6.4 oz (70.035 kg)    GENERAL:alert, no distress and comfortable SKIN: skin color, texture, turgor are normal, no rashes or significant lesions EYES:  normal, Conjunctiva are pink and non-injected, sclera clear OROPHARYNX:no exudate, no erythema and lips, buccal mucosa, and tongue normal  Musculoskeletal:no cyanosis of digits and no clubbing  NEURO: alert & oriented x 3 with fluent speech, no focal motor/sensory  deficits  LABORATORY DATA:  I have reviewed the data as listed    Component Value Date/Time   NA 142 11/23/2014 0907   NA 141 11/16/2014 0949   K 4.5 11/23/2014 0907   K 4.5 11/16/2014 0949   CL 103 11/16/2014 0949   CO2 21* 11/23/2014 0907   CO2 27 11/16/2014 0949   GLUCOSE 106 11/23/2014 0907   GLUCOSE 101* 11/16/2014 0949   BUN 16.1 11/23/2014 0907   BUN 12 11/16/2014 0949   CREATININE 0.8 11/23/2014 0907   CREATININE 0.87 11/16/2014 0949   CALCIUM 9.9 11/23/2014 0907   CALCIUM 10.0 11/16/2014 0949   PROT 7.8 11/23/2014 0907   PROT 6.8 11/16/2014 0949   ALBUMIN 4.4 11/23/2014 0907   ALBUMIN 4.0 11/16/2014 0949   AST 20 11/23/2014 0907   AST 23 11/16/2014 0949   ALT 30 11/23/2014 0907   ALT 23 11/16/2014 0949   ALKPHOS 50 11/23/2014 0907   ALKPHOS 40 11/16/2014 0949   BILITOT 0.40 11/23/2014 0907   BILITOT 0.6 11/16/2014 0949   GFRNONAA 76* 11/16/2014 0949   GFRAA 88* 11/16/2014 0949    No results found for: SPEP, UPEP  Lab Results  Component Value Date   WBC 6.6 11/23/2014   NEUTROABS 4.1 11/23/2014   HGB 15.7 11/23/2014   HCT 48.8* 11/23/2014   MCV 98.2 11/23/2014   PLT 267 11/23/2014      Chemistry      Component Value Date/Time   NA 142 11/23/2014 0907   NA 141 11/16/2014 0949   K 4.5 11/23/2014 0907   K 4.5 11/16/2014 0949   CL 103 11/16/2014 0949   CO2 21* 11/23/2014 0907   CO2 27 11/16/2014 0949   BUN 16.1 11/23/2014 0907   BUN 12 11/16/2014 0949   CREATININE 0.8 11/23/2014 0907   CREATININE 0.87 11/16/2014 0949      Component Value Date/Time   CALCIUM 9.9 11/23/2014 0907   CALCIUM 10.0 11/16/2014 0949   ALKPHOS 50 11/23/2014 0907   ALKPHOS 40 11/16/2014 0949   AST 20 11/23/2014 0907   AST 23 11/16/2014 0949   ALT 30 11/23/2014 0907   ALT 23 11/16/2014 0949   BILITOT 0.40 11/23/2014 0907   BILITOT 0.6 11/16/2014 0949     ASSESSMENT & PLAN:  Diffuse large B cell lymphoma We discussed the role of chemotherapy. The intent is for  cure.  We discussed some of the risks, benefits and side-effects of Rituximab,Cytoxan, Adriamycin,Vincristine and Solumedrol/Prednisone.   Some of the short term side-effects included, though not limited to, risk of fatigue, weight loss, tumor lysis syndrome, risk of allergic reactions, pancytopenia, life-threatening infections, need for transfusions of blood products, nausea, vomiting, change in bowel habits, hair loss, risk of congestive heart failure, admission to hospital for various reasons, and risks of death.   Long term side-effects are also discussed including permanent damage to nerve function, chronic fatigue, and rare secondary malignancy including bone marrow disorders.   The patient is aware that the response rates discussed earlier is not guaranteed.    After a long discussion, patient made an informed decision to proceed with the prescribed plan of care and went ahead to sign the consent form today.   Patient education material was dispensed  Tobacco abuse I spent some time counseling the patient the importance of tobacco cessation. She is currently not interested to quit now.     No orders of the defined types were placed in this encounter.   All questions were answered. The patient knows to call the clinic with any problems, questions or concerns. No barriers to learning was detected. I spent 30 minutes counseling the patient face to face. The total time spent in the appointment was 40 minutes and more than 50% was on counseling and review of test results     [facility], [place], MD 11/28/2014 3:13 PM

## 2014-11-29 ENCOUNTER — Ambulatory Visit: Payer: BLUE CROSS/BLUE SHIELD

## 2014-11-29 ENCOUNTER — Encounter (HOSPITAL_COMMUNITY)
Admission: RE | Admit: 2014-11-29 | Discharge: 2014-11-29 | Disposition: A | Discharge status: Home / Self Care | Payer: BLUE CROSS/BLUE SHIELD | Source: Ambulatory Visit | Attending: Hematology and Oncology | Admitting: Hematology and Oncology

## 2014-11-29 ENCOUNTER — Telehealth: Payer: Self-pay | Admitting: *Deleted

## 2014-11-29 DIAGNOSIS — C8330 Diffuse large B-cell lymphoma, unspecified site: Secondary | ICD-10-CM | POA: Insufficient documentation

## 2014-11-29 LAB — GLUCOSE, CAPILLARY: Glucose-Capillary: 100 mg/dL — ABNORMAL HIGH (ref 70–99)

## 2014-11-29 MED ORDER — FLUDEOXYGLUCOSE F - 18 (FDG) INJECTION
8.6000 | Freq: Once | INTRAVENOUS | Status: AC | PRN
  Administered 2014-11-29: 8.6 via INTRAVENOUS

## 2014-11-29 NOTE — Telephone Encounter (Signed)
Pt left VM asking about PET results. Dr. Alvy Bimler has reviewed and nurse may tell pt she has "early stage" disease.  Pt does need to keep her appt for chemo tomorrow as scheduled.  We can give her copy of PET report tomorrow.   I called pt back and left her a VM asking her to call nurse back for results.

## 2014-11-30 ENCOUNTER — Encounter: Payer: Self-pay | Admitting: Hematology and Oncology

## 2014-11-30 ENCOUNTER — Ambulatory Visit (HOSPITAL_BASED_OUTPATIENT_CLINIC_OR_DEPARTMENT_OTHER): Payer: BLUE CROSS/BLUE SHIELD

## 2014-11-30 ENCOUNTER — Other Ambulatory Visit: Payer: Self-pay | Admitting: Nurse Practitioner

## 2014-11-30 ENCOUNTER — Ambulatory Visit (HOSPITAL_BASED_OUTPATIENT_CLINIC_OR_DEPARTMENT_OTHER): Payer: BLUE CROSS/BLUE SHIELD | Admitting: Nurse Practitioner

## 2014-11-30 DIAGNOSIS — Z720 Tobacco use: Secondary | ICD-10-CM

## 2014-11-30 DIAGNOSIS — T7840XA Allergy, unspecified, initial encounter: Secondary | ICD-10-CM

## 2014-11-30 DIAGNOSIS — C8330 Diffuse large B-cell lymphoma, unspecified site: Secondary | ICD-10-CM

## 2014-11-30 DIAGNOSIS — Z5111 Encounter for antineoplastic chemotherapy: Secondary | ICD-10-CM

## 2014-11-30 DIAGNOSIS — Z5112 Encounter for antineoplastic immunotherapy: Secondary | ICD-10-CM

## 2014-11-30 MED ORDER — DEXAMETHASONE SODIUM PHOSPHATE 20 MG/5ML IJ SOLN
INTRAMUSCULAR | Status: AC
  Filled 2014-11-30: qty 5

## 2014-11-30 MED ORDER — ACETAMINOPHEN 325 MG PO TABS
650.0000 mg | ORAL_TABLET | Freq: Once | ORAL | Status: AC
  Administered 2014-11-30: 650 mg via ORAL

## 2014-11-30 MED ORDER — DIPHENHYDRAMINE HCL 25 MG PO CAPS
50.0000 mg | ORAL_CAPSULE | Freq: Once | ORAL | Status: AC
  Administered 2014-11-30: 50 mg via ORAL

## 2014-11-30 MED ORDER — SODIUM CHLORIDE 0.9 % IJ SOLN
10.0000 mL | INTRAMUSCULAR | Status: DC | PRN
  Administered 2014-11-30: 10 mL
  Filled 2014-11-30: qty 10

## 2014-11-30 MED ORDER — SODIUM CHLORIDE 0.9 % IV SOLN
750.0000 mg/m2 | Freq: Once | INTRAVENOUS | Status: AC
  Administered 2014-11-30: 1320 mg via INTRAVENOUS
  Filled 2014-11-30: qty 66

## 2014-11-30 MED ORDER — DIPHENHYDRAMINE HCL 50 MG/ML IJ SOLN
25.0000 mg | Freq: Once | INTRAMUSCULAR | Status: AC | PRN
  Administered 2014-11-30: 25 mg via INTRAVENOUS

## 2014-11-30 MED ORDER — ACETAMINOPHEN 325 MG PO TABS
ORAL_TABLET | ORAL | Status: AC
  Filled 2014-11-30: qty 2

## 2014-11-30 MED ORDER — DOXORUBICIN HCL CHEMO IV INJECTION 2 MG/ML
50.0000 mg/m2 | Freq: Once | INTRAVENOUS | Status: AC
  Administered 2014-11-30: 88 mg via INTRAVENOUS
  Filled 2014-11-30: qty 44

## 2014-11-30 MED ORDER — DIPHENHYDRAMINE HCL 25 MG PO CAPS
ORAL_CAPSULE | ORAL | Status: AC
  Filled 2014-11-30: qty 2

## 2014-11-30 MED ORDER — ONDANSETRON 16 MG/50ML IVPB (CHCC)
16.0000 mg | Freq: Once | INTRAVENOUS | Status: AC
  Administered 2014-11-30: 16 mg via INTRAVENOUS

## 2014-11-30 MED ORDER — DIPHENHYDRAMINE HCL 50 MG/ML IJ SOLN
25.0000 mg | Freq: Once | INTRAMUSCULAR | Status: AC
  Administered 2014-11-30: 25 mg via INTRAVENOUS

## 2014-11-30 MED ORDER — ONDANSETRON 16 MG/50ML IVPB (CHCC)
INTRAVENOUS | Status: AC
  Filled 2014-11-30: qty 16

## 2014-11-30 MED ORDER — HEPARIN SOD (PORK) LOCK FLUSH 100 UNIT/ML IV SOLN
500.0000 [IU] | Freq: Once | INTRAVENOUS | Status: AC | PRN
  Administered 2014-11-30: 500 [IU]
  Filled 2014-11-30: qty 5

## 2014-11-30 MED ORDER — DIPHENHYDRAMINE HCL 50 MG/ML IJ SOLN
INTRAMUSCULAR | Status: AC
  Filled 2014-11-30: qty 1

## 2014-11-30 MED ORDER — DEXAMETHASONE SODIUM PHOSPHATE 20 MG/5ML IJ SOLN
20.0000 mg | Freq: Once | INTRAMUSCULAR | Status: AC
Start: 2014-11-30 — End: 2014-11-30
  Administered 2014-11-30: 20 mg via INTRAVENOUS

## 2014-11-30 MED ORDER — SODIUM CHLORIDE 0.9 % IV SOLN
Freq: Once | INTRAVENOUS | Status: AC
  Administered 2014-11-30: 10:00:00 via INTRAVENOUS

## 2014-11-30 MED ORDER — VINCRISTINE SULFATE CHEMO INJECTION 1 MG/ML
2.0000 mg | Freq: Once | INTRAVENOUS | Status: AC
  Administered 2014-11-30: 2 mg via INTRAVENOUS
  Filled 2014-11-30: qty 2

## 2014-11-30 MED ORDER — FAMOTIDINE IN NACL 20-0.9 MG/50ML-% IV SOLN
20.0000 mg | Freq: Once | INTRAVENOUS | Status: AC | PRN
  Administered 2014-11-30: 20 mg via INTRAVENOUS

## 2014-11-30 MED ORDER — SODIUM CHLORIDE 0.9 % IV SOLN
375.0000 mg/m2 | Freq: Once | INTRAVENOUS | Status: AC
  Administered 2014-11-30: 700 mg via INTRAVENOUS
  Filled 2014-11-30: qty 70

## 2014-11-30 NOTE — Progress Notes (Signed)
Pt is approved for the $400 [facility] grant.  °

## 2014-11-30 NOTE — Progress Notes (Signed)
1715: Per Selena Lesser, NP and Dr. Alvy Bimler we will stop rituxan infusion at 18:00.  1745: Pt reports feeling "slightly scratchy throat." Rituxan infusion stopped and Normal Saline initiated. Vital signs stable. Selena Lesser, NP notified. Instructed to give pt additional 25 mg IVP benadryl.  Will continue to monitor patient closely.  1800: Pt states she feels back to normal. Denies any itching, sob, difficulty swallowing.   1815: Pt stable, vital signs stable. Selena Lesser, NP at chairside to reassess patient. Pt states she feels back to normal. Okay to discharge patient per Selena Lesser, NP.  Pt ambulatory at discharge and in no apparent distress, sister driving patient home.

## 2014-11-30 NOTE — Anesthesia Postprocedure Evaluation (Signed)
  Anesthesia Post-op Note  Patient: Alexandra Allen  Procedure(s) Performed: Procedure(s) (LRB): INSERTION PORT-A-CATH LEFT SUBCLAVIAN (Left)  Patient Location: PACU  Anesthesia Type: General  Level of Consciousness: awake and alert   Airway and Oxygen Therapy: Patient Spontanous Breathing  Post-op Pain: mild  Post-op Assessment: Post-op Vital signs reviewed, Patient's Cardiovascular Status Stable, Respiratory Function Stable, Patent Airway and No signs of Nausea or vomiting  Last Vitals:  Filed Vitals:   11/17/14 1451  BP: 119/77  Pulse: 80  Temp:   Resp:     Post-op Vital Signs: stable   Complications: No apparent anesthesia complications

## 2014-11-30 NOTE — Progress Notes (Signed)
1304 pt complains of sore throat with swallowing, denies shortness of breath or chest pain. VS stable  Rituxan stopped. NS running. Benadryl 25mg  IV given. Notified Selena Lesser, NP [place], NP at chairside VO pepcid 20mg   IV; proceed with NS wide open x 15 mins. NP will advise Dr. Alvy Bimler of pt hypersensitivity and return to check on pt in approx 15-20 mins. VS remain stable. 1528 pt advised swallowing is " a little Better" Pt refused any steriods, VO per NP restart Rituxan at slower rate of 45.7 x 22.8cc (30 mins)

## 2014-11-30 NOTE — Patient Instructions (Addendum)
[facility] Discharge Instructions for Patients Receiving Chemotherapy  Today you received the following chemotherapy agents: Adriamycin, Vincristine, Cytoxan, and Rituxan.   To help prevent nausea and vomiting after your treatment, we encourage you to take your nausea medication as prescribed.   If you develop nausea and vomiting that is not controlled by your nausea medication, call the clinic.   BELOW ARE SYMPTOMS THAT SHOULD BE REPORTED IMMEDIATELY:  *FEVER GREATER THAN 100.5 F  *CHILLS WITH OR WITHOUT FEVER  NAUSEA AND VOMITING THAT IS NOT CONTROLLED WITH YOUR NAUSEA MEDICATION  *UNUSUAL SHORTNESS OF BREATH  *UNUSUAL BRUISING OR BLEEDING  TENDERNESS IN MOUTH AND THROAT WITH OR WITHOUT PRESENCE OF ULCERS  *URINARY PROBLEMS  *BOWEL PROBLEMS  UNUSUAL RASH Items with * indicate a potential emergency and should be followed up as soon as possible.  Feel free to call the clinic you have any questions or concerns. The clinic phone number is [phone].     Doxorubicin injection What is this medicine? DOXORUBICIN (dox oh ROO bi sin) is a chemotherapy drug. It is used to treat many kinds of cancer like Hodgkin's disease, leukemia, non-Hodgkin's lymphoma, neuroblastoma, sarcoma, and Wilms' tumor. It is also used to treat bladder cancer, breast cancer, lung cancer, ovarian cancer, stomach cancer, and thyroid cancer. This medicine may be used for other purposes; ask your health care provider or pharmacist if you have questions. COMMON BRAND NAME(S): Adriamycin, Adriamycin PFS, Adriamycin RDF, Rubex What should I tell my health care provider before I take this medicine? They need to know if you have any of these conditions: -blood disorders -heart disease, recent heart attack -infection (especially a virus infection such as chickenpox, cold sores, or herpes) -irregular heartbeat -liver disease -recent or ongoing radiation therapy -an unusual or allergic  reaction to doxorubicin, other chemotherapy agents, other medicines, foods, dyes, or preservatives -pregnant or trying to get pregnant -breast-feeding How should I use this medicine? This drug is given as an infusion into a vein. It is administered in a hospital or clinic by a specially trained health care professional. If you have pain, swelling, burning or any unusual feeling around the site of your injection, tell your health care professional right away. Talk to your pediatrician regarding the use of this medicine in children. Special care may be needed. Overdosage: If you think you have taken too much of this medicine contact a poison control center or emergency room at once. NOTE: This medicine is only for you. Do not share this medicine with others. What if I miss a dose? It is important not to miss your dose. Call your doctor or health care professional if you are unable to keep an appointment. What may interact with this medicine? Do not take this medicine with any of the following medications: -cisapride -droperidol -halofantrine -pimozide -zidovudine This medicine may also interact with the following medications: -chloroquine -chlorpromazine -clarithromycin -cyclophosphamide -cyclosporine -erythromycin -medicines for depression, anxiety, or psychotic disturbances -medicines for irregular heart beat like amiodarone, bepridil, dofetilide, encainide, flecainide, propafenone, quinidine -medicines for seizures like ethotoin, fosphenytoin, phenytoin -medicines for nausea, vomiting like dolasetron, ondansetron, palonosetron -medicines to increase blood counts like filgrastim, pegfilgrastim, sargramostim -methadone -methotrexate -pentamidine -progesterone -vaccines -verapamil Talk to your doctor or health care professional before taking any of these medicines: -acetaminophen -aspirin -ibuprofen -ketoprofen -naproxen This list may not describe all possible interactions.  Give your health care provider a list of all the medicines, herbs, non-prescription drugs, or dietary supplements you use. Also tell them  if you smoke, drink alcohol, or use illegal drugs. Some items may interact with your medicine. What should I watch for while using this medicine? Your condition will be monitored carefully while you are receiving this medicine. You will need important blood work done while you are taking this medicine. This drug may make you feel generally unwell. This is not uncommon, as chemotherapy can affect healthy cells as well as cancer cells. Report any side effects. Continue your course of treatment even though you feel ill unless your doctor tells you to stop. Your urine may turn red for a few days after your dose. This is not blood. If your urine is dark or brown, call your doctor. In some cases, you may be given additional medicines to help with side effects. Follow all directions for their use. Call your doctor or health care professional for advice if you get a fever, chills or sore throat, or other symptoms of a cold or flu. Do not treat yourself. This drug decreases your body's ability to fight infections. Try to avoid being around people who are sick. This medicine may increase your risk to bruise or bleed. Call your doctor or health care professional if you notice any unusual bleeding. Be careful brushing and flossing your teeth or using a toothpick because you may get an infection or bleed more easily. If you have any dental work done, tell your dentist you are receiving this medicine. Avoid taking products that contain aspirin, acetaminophen, ibuprofen, naproxen, or ketoprofen unless instructed by your doctor. These medicines may hide a fever. Men and women of childbearing age should use effective birth control methods while using taking this medicine. Do not become pregnant while taking this medicine. There is a potential for serious side effects to an unborn child.  Talk to your health care professional or pharmacist for more information. Do not breast-feed an infant while taking this medicine. Do not let others touch your urine or other body fluids for 5 days after each treatment with this medicine. Caregivers should wear latex gloves to avoid touching body fluids during this time. There is a maximum amount of this medicine you should receive throughout your life. The amount depends on the medical condition being treated and your overall health. Your doctor will watch how much of this medicine you receive in your lifetime. Tell your doctor if you have taken this medicine before. What side effects may I notice from receiving this medicine? Side effects that you should report to your doctor or health care professional as soon as possible: -allergic reactions like skin rash, itching or hives, swelling of the face, lips, or tongue -low blood counts - this medicine may decrease the number of white blood cells, red blood cells and platelets. You may be at increased risk for infections and bleeding. -signs of infection - fever or chills, cough, sore throat, pain or difficulty passing urine -signs of decreased platelets or bleeding - bruising, pinpoint red spots on the skin, black, tarry stools, blood in the urine -signs of decreased red blood cells - unusually weak or tired, fainting spells, lightheadedness -breathing problems -chest pain -fast, irregular heartbeat -mouth sores -nausea, vomiting -pain, swelling, redness at site where injected -pain, tingling, numbness in the hands or feet -swelling of ankles, feet, or hands -unusual bleeding or bruising Side effects that usually do not require medical attention (report to your doctor or health care professional if they continue or are bothersome): -diarrhea -facial flushing -hair loss -loss of appetite -missed  menstrual periods -nail discoloration or damage -red or watery eyes -red colored urine -stomach  upset This list may not describe all possible side effects. Call your doctor for medical advice about side effects. You may report side effects to FDA at 1-800-FDA-1088. Where should I keep my medicine? This drug is given in a hospital or clinic and will not be stored at home. NOTE: This sheet is a summary. It may not cover all possible information. If you have questions about this medicine, talk to your doctor, pharmacist, or health care provider.  2015, [employer]. (2013-02-09 09:54:34) Vincristine injection What is this medicine? VINCRISTINE (vin KRIS teen) is a chemotherapy drug. It slows the growth of cancer cells. This medicine is used to treat many types of cancer like Hodgkin's disease, leukemia, non-Hodgkin's lymphoma, neuroblastoma (brain cancer), rhabdomyosarcoma, and Wilms' tumor. This medicine may be used for other purposes; ask your health care provider or pharmacist if you have questions. COMMON BRAND NAME(S): Oncovin, Vincasar PFS What should I tell my health care provider before I take this medicine? They need to know if you have any of these conditions: -blood disorders -gout -infection (especially chickenpox, cold sores, or herpes) -kidney disease -liver disease -lung disease -nervous system disease like Charcot-Marie-Tooth (CMT) -recent or ongoing radiation therapy -an unusual or allergic reaction to vincristine, other chemotherapy agents, other medicines, foods, dyes, or preservatives -pregnant or trying to get pregnant -breast-feeding How should I use this medicine? This drug is given as an infusion into a vein. It is administered in a hospital or clinic by a specially trained health care professional. If you have pain, swelling, burning, or any unusual feeling around the site of your injection, tell your health care professional right away. Talk to your pediatrician regarding the use of this medicine in children. While this drug may be prescribed for  selected conditions, precautions do apply. Overdosage: If you think you have taken too much of this medicine contact a poison control center or emergency room at once. NOTE: This medicine is only for you. Do not share this medicine with others. What if I miss a dose? It is important not to miss your dose. Call your doctor or health care professional if you are unable to keep an appointment. What may interact with this medicine? Do not take this medicine with any of the following medications: -itraconazole -mibefradil -voriconazole This medicine may also interact with the following medications: -cyclosporine -erythromycin -fluconazole -ketoconazole -medicines for HIV like delavirdine, efavirenz, nevirapine -medicines for seizures like ethotoin, fosphenotoin, phenytoin -medicines to increase blood counts like filgrastim, pegfilgrastim, sargramostim -other chemotherapy drugs like cisplatin, L-asparaginase, methotrexate, mitomycin, paclitaxel -pegaspargase -vaccines -zalcitabine, ddC Talk to your doctor or health care professional before taking any of these medicines: -acetaminophen -aspirin -ibuprofen -ketoprofen -naproxen This list may not describe all possible interactions. Give your health care provider a list of all the medicines, herbs, non-prescription drugs, or dietary supplements you use. Also tell them if you smoke, drink alcohol, or use illegal drugs. Some items may interact with your medicine. What should I watch for while using this medicine? Your condition will be monitored carefully while you are receiving this medicine. You will need important blood work done while you are taking this medicine. This drug may make you feel generally unwell. This is not uncommon, as chemotherapy can affect healthy cells as well as cancer cells. Report any side effects. Continue your course of treatment even though you feel ill unless your doctor tells you to stop. In  some cases, you may be  given additional medicines to help with side effects. Follow all directions for their use. Call your doctor or health care professional for advice if you get a fever, chills or sore throat, or other symptoms of a cold or flu. Do not treat yourself. Avoid taking products that contain aspirin, acetaminophen, ibuprofen, naproxen, or ketoprofen unless instructed by your doctor. These medicines may hide a fever. Do not become pregnant while taking this medicine. Women should inform their doctor if they wish to become pregnant or think they might be pregnant. There is a potential for serious side effects to an unborn child. Talk to your health care professional or pharmacist for more information. Do not breast-feed an infant while taking this medicine. Men may have a lower sperm count while taking this medicine. Talk to your doctor if you plan to father a child. What side effects may I notice from receiving this medicine? Side effects that you should report to your doctor or health care professional as soon as possible: -allergic reactions like skin rash, itching or hives, swelling of the face, lips, or tongue -breathing problems -confusion or changes in emotions or moods -constipation -cough -mouth sores -muscle weakness -nausea and vomiting -pain, swelling, redness or irritation at the injection site -pain, tingling, numbness in the hands or feet -problems with balance, talking, walking -seizures -stomach pain -trouble passing urine or change in the amount of urine Side effects that usually do not require medical attention (report to your doctor or health care professional if they continue or are bothersome): -diarrhea -hair loss -jaw pain -loss of appetite This list may not describe all possible side effects. Call your doctor for medical advice about side effects. You may report side effects to FDA at 1-800-FDA-1088. Where should I keep my medicine? This drug is given in a hospital or clinic  and will not be stored at home. NOTE: This sheet is a summary. It may not cover all possible information. If you have questions about this medicine, talk to your doctor, pharmacist, or health care provider.  2015, [employer] Standard. (2008-07-11 17:17:13) Rituximab injection What is this medicine? RITUXIMAB (ri TUX i mab) is a monoclonal antibody. This medicine changes the way the body's immune system works. It is used commonly to treat non-Hodgkin's lymphoma and other conditions. In cancer cells, this drug targets a specific protein within cancer cells and stops the cancer cells from growing. It is also used to treat rhuematoid arthritis (RA). In RA, this medicine slow the inflammatory process and help reduce joint pain and swelling. This medicine is often used with other cancer or arthritis medications. This medicine may be used for other purposes; ask your health care provider or pharmacist if you have questions. COMMON BRAND NAME(S): Rituxan What should I tell my health care provider before I take this medicine? They need to know if you have any of these conditions: -blood disorders -heart disease -history of hepatitis B -infection (especially a virus infection such as chickenpox, cold sores, or herpes) -irregular heartbeat -kidney disease -lung or breathing disease, like asthma -lupus -an unusual or allergic reaction to rituximab, mouse proteins, other medicines, foods, dyes, or preservatives -pregnant or trying to get pregnant -breast-feeding How should I use this medicine? This medicine is for infusion into a vein. It is administered in a hospital or clinic by a specially trained health care professional. A special MedGuide will be given to you by the pharmacist with each prescription and refill. Be sure to  read this information carefully each time. Talk to your pediatrician regarding the use of this medicine in children. This medicine is not approved for use in  children. Overdosage: If you think you have taken too much of this medicine contact a poison control center or emergency room at once. NOTE: This medicine is only for you. Do not share this medicine with others. What if I miss a dose? It is important not to miss a dose. Call your doctor or health care professional if you are unable to keep an appointment. What may interact with this medicine? -cisplatin -medicines for blood pressure -some other medicines for arthritis -vaccines This list may not describe all possible interactions. Give your health care provider a list of all the medicines, herbs, non-prescription drugs, or dietary supplements you use. Also tell them if you smoke, drink alcohol, or use illegal drugs. Some items may interact with your medicine. What should I watch for while using this medicine? Report any side effects that you notice during your treatment right away, such as changes in your breathing, fever, chills, dizziness or lightheadedness. These effects are more common with the first dose. Visit your prescriber or health care professional for checks on your progress. You will need to have regular blood work. Report any other side effects. The side effects of this medicine can continue after you finish your treatment. Continue your course of treatment even though you feel ill unless your doctor tells you to stop. Call your doctor or health care professional for advice if you get a fever, chills or sore throat, or other symptoms of a cold or flu. Do not treat yourself. This drug decreases your body's ability to fight infections. Try to avoid being around people who are sick. This medicine may increase your risk to bruise or bleed. Call your doctor or health care professional if you notice any unusual bleeding. Be careful brushing and flossing your teeth or using a toothpick because you may get an infection or bleed more easily. If you have any dental work done, tell your dentist  you are receiving this medicine. Avoid taking products that contain aspirin, acetaminophen, ibuprofen, naproxen, or ketoprofen unless instructed by your doctor. These medicines may hide a fever. Do not become pregnant while taking this medicine. Women should inform their doctor if they wish to become pregnant or think they might be pregnant. There is a potential for serious side effects to an unborn child. Talk to your health care professional or pharmacist for more information. Do not breast-feed an infant while taking this medicine. What side effects may I notice from receiving this medicine? Side effects that you should report to your doctor or health care professional as soon as possible: -allergic reactions like skin rash, itching or hives, swelling of the face, lips, or tongue -low blood counts - this medicine may decrease the number of white blood cells, red blood cells and platelets. You may be at increased risk for infections and bleeding. -signs of infection - fever or chills, cough, sore throat, pain or difficulty passing urine -signs of decreased platelets or bleeding - bruising, pinpoint red spots on the skin, black, tarry stools, blood in the urine -signs of decreased red blood cells - unusually weak or tired, fainting spells, lightheadedness -breathing problems -confused, not responsive -chest pain -fast, irregular heartbeat -feeling faint or lightheaded, falls -mouth sores -redness, blistering, peeling or loosening of the skin, including inside the mouth -stomach pain -swelling of the ankles, feet, or hands -trouble passing  urine or change in the amount of urine Side effects that usually do not require medical attention (report to your doctor or other health care professional if they continue or are bothersome): -anxiety -headache -loss of appetite -muscle aches -nausea -night sweats This list may not describe all possible side effects. Call your doctor for medical advice  about side effects. You may report side effects to FDA at 1-800-FDA-1088. Where should I keep my medicine? This drug is given in a hospital or clinic and will not be stored at home. NOTE: This sheet is a summary. It may not cover all possible information. If you have questions about this medicine, talk to your doctor, pharmacist, or health care provider.  2015, [employer] Standard. (2008-06-13 14:04:59) Cyclophosphamide injection What is this medicine? CYCLOPHOSPHAMIDE (sye kloe FOSS fa mide) is a chemotherapy drug. It slows the growth of cancer cells. This medicine is used to treat many types of cancer like lymphoma, myeloma, leukemia, breast cancer, and ovarian cancer, to name a few. This medicine may be used for other purposes; ask your health care provider or pharmacist if you have questions. COMMON BRAND NAME(S): Cytoxan, Neosar What should I tell my health care provider before I take this medicine? They need to know if you have any of these conditions: -blood disorders -history of other chemotherapy -infection -kidney disease -liver disease -recent or ongoing radiation therapy -tumors in the bone marrow -an unusual or allergic reaction to cyclophosphamide, other chemotherapy, other medicines, foods, dyes, or preservatives -pregnant or trying to get pregnant -breast-feeding How should I use this medicine? This drug is usually given as an injection into a vein or muscle or by infusion into a vein. It is administered in a hospital or clinic by a specially trained health care professional. Talk to your pediatrician regarding the use of this medicine in children. Special care may be needed. Overdosage: If you think you have taken too much of this medicine contact a poison control center or emergency room at once. NOTE: This medicine is only for you. Do not share this medicine with others. What if I miss a dose? It is important not to miss your dose. Call your doctor or health care  professional if you are unable to keep an appointment. What may interact with this medicine? This medicine may interact with the following medications: -amiodarone -amphotericin B -azathioprine -certain antiviral medicines for HIV or AIDS such as protease inhibitors (e.g., indinavir, ritonavir) and zidovudine -certain blood pressure medications such as benazepril, captopril, enalapril, fosinopril, lisinopril, moexipril, monopril, perindopril, quinapril, ramipril, trandolapril -certain cancer medications such as anthracyclines (e.g., daunorubicin, doxorubicin), busulfan, cytarabine, paclitaxel, pentostatin, tamoxifen, trastuzumab -certain diuretics such as chlorothiazide, chlorthalidone, hydrochlorothiazide, indapamide, metolazone -certain medicines that treat or prevent blood clots like warfarin -certain muscle relaxants such as succinylcholine -cyclosporine -etanercept -indomethacin -medicines to increase blood counts like filgrastim, pegfilgrastim, sargramostim -medicines used as general anesthesia -metronidazole -natalizumab This list may not describe all possible interactions. Give your health care provider a list of all the medicines, herbs, non-prescription drugs, or dietary supplements you use. Also tell them if you smoke, drink alcohol, or use illegal drugs. Some items may interact with your medicine. What should I watch for while using this medicine? Visit your doctor for checks on your progress. This drug may make you feel generally unwell. This is not uncommon, as chemotherapy can affect healthy cells as well as cancer cells. Report any side effects. Continue your course of treatment even though you feel ill unless your doctor  tells you to stop. Drink water or other fluids as directed. Urinate often, even at night. In some cases, you may be given additional medicines to help with side effects. Follow all directions for their use. Call your doctor or health care professional for  advice if you get a fever, chills or sore throat, or other symptoms of a cold or flu. Do not treat yourself. This drug decreases your body's ability to fight infections. Try to avoid being around people who are sick. This medicine may increase your risk to bruise or bleed. Call your doctor or health care professional if you notice any unusual bleeding. Be careful brushing and flossing your teeth or using a toothpick because you may get an infection or bleed more easily. If you have any dental work done, tell your dentist you are receiving this medicine. You may get drowsy or dizzy. Do not drive, use machinery, or do anything that needs mental alertness until you know how this medicine affects you. Do not become pregnant while taking this medicine or for 1 year after stopping it. Women should inform their doctor if they wish to become pregnant or think they might be pregnant. Men should not father a child while taking this medicine and for 4 months after stopping it. There is a potential for serious side effects to an unborn child. Talk to your health care professional or pharmacist for more information. Do not breast-feed an infant while taking this medicine. This medicine may interfere with the ability to have a child. This medicine has caused ovarian failure in some women. This medicine has caused reduced sperm counts in some men. You should talk with your doctor or health care professional if you are concerned about your fertility. If you are going to have surgery, tell your doctor or health care professional that you have taken this medicine. What side effects may I notice from receiving this medicine? Side effects that you should report to your doctor or health care professional as soon as possible: -allergic reactions like skin rash, itching or hives, swelling of the face, lips, or tongue -low blood counts - this medicine may decrease the number of white blood cells, red blood cells and platelets.  You may be at increased risk for infections and bleeding. -signs of infection - fever or chills, cough, sore throat, pain or difficulty passing urine -signs of decreased platelets or bleeding - bruising, pinpoint red spots on the skin, black, tarry stools, blood in the urine -signs of decreased red blood cells - unusually weak or tired, fainting spells, lightheadedness -breathing problems -dark urine -dizziness -palpitations -swelling of the ankles, feet, hands -trouble passing urine or change in the amount of urine -weight gain -yellowing of the eyes or skin Side effects that usually do not require medical attention (report to your doctor or health care professional if they continue or are bothersome): -changes in nail or skin color -hair loss -missed menstrual periods -mouth sores -nausea, vomiting This list may not describe all possible side effects. Call your doctor for medical advice about side effects. You may report side effects to FDA at 1-800-FDA-1088. Where should I keep my medicine? This drug is given in a hospital or clinic and will not be stored at home. NOTE: This sheet is a summary. It may not cover all possible information. If you have questions about this medicine, talk to your doctor, pharmacist, or health care provider.  2015, Elsevier/Gold Standard. (2012-08-28 16:22:58)

## 2014-12-01 ENCOUNTER — Telehealth: Payer: Self-pay | Admitting: Hematology and Oncology

## 2014-12-01 ENCOUNTER — Ambulatory Visit (HOSPITAL_BASED_OUTPATIENT_CLINIC_OR_DEPARTMENT_OTHER): Payer: BLUE CROSS/BLUE SHIELD

## 2014-12-01 ENCOUNTER — Other Ambulatory Visit: Payer: Self-pay | Admitting: *Deleted

## 2014-12-01 ENCOUNTER — Encounter: Payer: Self-pay | Admitting: Nurse Practitioner

## 2014-12-01 ENCOUNTER — Telehealth: Payer: Self-pay | Admitting: Certified Registered Nurse Anesthetist

## 2014-12-01 ENCOUNTER — Telehealth: Payer: Self-pay | Admitting: *Deleted

## 2014-12-01 ENCOUNTER — Ambulatory Visit (HOSPITAL_BASED_OUTPATIENT_CLINIC_OR_DEPARTMENT_OTHER): Payer: BLUE CROSS/BLUE SHIELD | Admitting: Hematology and Oncology

## 2014-12-01 VITALS — BP 135/90 | HR 72 | Temp 98.2°F | Resp 19 | Ht 63.0 in | Wt 158.8 lb

## 2014-12-01 DIAGNOSIS — C8330 Diffuse large B-cell lymphoma, unspecified site: Secondary | ICD-10-CM

## 2014-12-01 DIAGNOSIS — Z5189 Encounter for other specified aftercare: Secondary | ICD-10-CM

## 2014-12-01 DIAGNOSIS — C8335 Diffuse large B-cell lymphoma, lymph nodes of inguinal region and lower limb: Secondary | ICD-10-CM

## 2014-12-01 DIAGNOSIS — T7840XA Allergy, unspecified, initial encounter: Secondary | ICD-10-CM | POA: Insufficient documentation

## 2014-12-01 DIAGNOSIS — T7840XS Allergy, unspecified, sequela: Secondary | ICD-10-CM

## 2014-12-01 MED ORDER — PEGFILGRASTIM INJECTION 6 MG/0.6ML ~~LOC~~
6.0000 mg | PREFILLED_SYRINGE | Freq: Once | SUBCUTANEOUS | Status: AC
  Administered 2014-12-01: 6 mg via SUBCUTANEOUS
  Filled 2014-12-01: qty 0.6

## 2014-12-01 NOTE — Assessment & Plan Note (Signed)
Patient received her first cycle R-CHOP chemotherapy today.  She tolerated the CHOP portion of her chemotherapy with no difficulty; but did develop a mild hypersensitivity reaction which consisted of a rash and some throat scratchiness during the Rituxan infusion.  Infusion was held; and patient received hypersensitivity protocol management medications which completely resolved her symptoms.  Patient was able to complete the Rituxan portion of her chemotherapy today.  Patient has plans to return tomorrow for a follow-up visit; and to receive her Neulasta injection.  Patient has plans to return on 12/21/2014 for her next cycle of chemotherapy.

## 2014-12-01 NOTE — Progress Notes (Signed)
SYMPTOM MANAGEMENT CLINIC   HPI: Alexandra Allen 54 y.o. female diagnosed with lymphoma.  Here today to initiate R-CHOP chemotherapy regimen.  Patient presented to the [facility] today to initiate her first cycle of R-CHOP chemotherapy.  She tolerated the CHOP torsion of her chemotherapy with no difficulty whatsoever; but did develop a mild hypersensitivity reaction to the Rituxan infusion.  She developed some generalized rash and throat scratchiness.  Infusion was held and patient was given both Benadryl 25 mg IV, and Pepcid 20 mg IV.  Patient requested holding any Solu-Medrol for the reaction; stating that it made her feel fuzzy.  Following a period of close monitoring-patient was able to proceed and finish the Rituxan portion of her chemotherapy.  Vital signs remained stable throughout.  Patient did admit to some anxiety today; stating that it was difficult for her to go all day with out smoking.   HPI  CURRENT THERAPY: Upcoming Treatment Dates - NON-HODGKINS LYMPHOMA R-CHOP q21d Days with orders from any treatment category:  12/01/2014      SCHEDULING COMMUNICATION INJECTION      pegfilgrastim (NEULASTA) injection 6 mg 12/21/2014      SCHEDULING COMMUNICATION      acetaminophen (TYLENOL) tablet 650 mg      diphenhydrAMINE (BENADRYL) capsule 50 mg      ondansetron (ZOFRAN) IVPB 16 mg      Dexamethasone Sodium Phosphate (DECADRON) injection 20 mg      DOXOrubicin (ADRIAMYCIN) chemo injection 88 mg      vinCRIStine (ONCOVIN) 2 mg in sodium chloride 0.9 % 50 mL chemo infusion      cyclophosphamide (CYTOXAN) 1,320 mg in sodium chloride 0.9 % 250 mL chemo infusion      riTUXimab (RITUXAN) 700 mg in sodium chloride 0.9 % 250 mL (2.1875 mg/mL) chemo infusion      sodium chloride 0.9 % injection 10 mL      heparin lock flush 100 unit/mL      heparin lock flush 100 unit/mL      alteplase (CATHFLO ACTIVASE) injection 2 mg      sodium chloride 0.9 % injection 3 mL      Cold Pack 1  packet      Hot Pack 1 packet      diphenhydrAMINE (BENADRYL) injection 25 mg      famotidine (PEPCID) IVPB 20 mg      0.9 %  sodium chloride infusion      methylPREDNISolone sodium succinate (SOLU-MEDROL) 125 mg/2 mL injection 125 mg      EPINEPHrine (ADRENALIN) 0.1 MG/ML injection 0.25 mg      EPINEPHrine (ADRENALIN) 0.1 MG/ML injection 0.25 mg      EPINEPHrine (ADRENALIN) injection 0.5 mg      EPINEPHrine (ADRENALIN) injection 0.5 mg      diphenhydrAMINE (BENADRYL) injection 50 mg      albuterol (PROVENTIL) (2.5 MG/3ML) 0.083% nebulizer solution 2.5 mg      0.9 %  sodium chloride infusion      TREATMENT CONDITIONS 12/22/2014      SCHEDULING COMMUNICATION INJECTION      pegfilgrastim (NEULASTA) injection 6 mg    ROS  Past Medical History  Diagnosis Date  . Thyroid disease     multiple nodules  . Vocal cord edema   . 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 History of bronchitis   . Hoarseness   . Lightheaded  occasionally   . Arthritis   . Myalgia   . Joint pain   . Joint swelling   . GERD (gastroesophageal reflux disease)     takes DGL(over the counter meds)  . Urinary frequency     Past Surgical History  Procedure Laterality Date  . Abdominal hysterectomy  2001  . Lymph node biopsy      under left arm and groin biopsy  . Laryngoscopy    . Biopsy thyroid    . Portacath placement Left 11/17/2014    Procedure: INSERTION PORT-A-CATH LEFT SUBCLAVIAN;  Surgeon: Stark Klein, MD;  Location: [facility];  Service: General;  Laterality: Left;    has Diffuse large B cell lymphoma; Tobacco abuse; and Hypersensitivity reaction on her problem list.    is allergic to adhesive; influenza vaccines; prednisone; and sulfa antibiotics.    Medication List       This list is accurate as of: 11/30/14 11:59 PM.  Always use your most recent med list.               ADVIL 200 MG tablet  Generic drug:   ibuprofen  Take 200 mg by mouth every 6 (six) hours as needed.     allopurinol 300 MG tablet  Commonly known as:  ZYLOPRIM  Take 1 tablet (300 mg total) by mouth daily.     GLUCOSAMINE CHONDR 1500 COMPLX PO  Take 1 tablet by mouth daily.     HYDROcodone-acetaminophen 7.5-325 MG per tablet  Commonly known as:  NORCO  Take 7.5 tablets by mouth.     MULTIVITAMIN PO  Take by mouth daily.     ondansetron 8 MG tablet  Commonly known as:  ZOFRAN  Take 1 tablet (8 mg total) by mouth every 8 (eight) hours as needed.     oxyCODONE-acetaminophen 5-325 MG per tablet  Commonly known as:  ROXICET  Take 1-2 tablets by mouth every 4 (four) hours as needed for severe pain.     predniSONE 20 MG tablet  Commonly known as:  DELTASONE  Take 2 tablets (40 mg total) by mouth daily. Take on days 2-5 of chemotherapy.     prochlorperazine 10 MG tablet  Commonly known as:  COMPAZINE  Take 1 tablet (10 mg total) by mouth every 6 (six) hours as needed (Nausea or vomiting).     UNABLE TO FIND  Take 1 tablet by mouth at bedtime. Med Name: Supplement "DGL"         PHYSICAL EXAMINATION  Vitals: 124/76, 74, 97.6, 99 %   Physical Exam  Constitutional: She is oriented to person, place, and time. Vital signs are normal. She appears unhealthy.  HENT:  Head: Normocephalic and atraumatic.  Mouth/Throat: Oropharynx is clear and moist.  Patient complaining of scratchy throat following Rituxan infusion; but patient managing all secretions and maintaining airway with no difficulty whatsoever.  Eyes: Conjunctivae and EOM are normal. Pupils are equal, round, and reactive to light. Right eye exhibits no discharge. Left eye exhibits no discharge. No scleral icterus.  Neck: Normal range of motion. Neck supple. No JVD present. No tracheal deviation present. No thyromegaly present.  Cardiovascular: Normal rate, regular rhythm, normal heart sounds and intact distal pulses.   Pulmonary/Chest: Effort normal and breath  sounds normal. No stridor. No respiratory distress. She has no wheezes. She has no rales. She exhibits no tenderness.  Abdominal: Soft. Bowel sounds are normal. She exhibits no distension and no mass. There is no tenderness. There is no rebound  and no guarding.  Musculoskeletal: Normal range of motion. She exhibits no edema or tenderness.  Lymphadenopathy:    She has no cervical adenopathy.  Neurological: She is alert and oriented to person, place, and time. Gait normal.  Skin: Skin is warm and dry. Rash noted. No erythema.  Scattered, mild rash to anterior chest and upper back only.  Patient denies any pruritus with this rash.  Psychiatric: Affect normal.  Nursing note and vitals reviewed.   LABORATORY DATA:. Hospital Outpatient Visit on 11/29/2014  Component Date Value Ref Range Status  . Glucose-Capillary 11/29/2014 100* 70 - 99 mg/dL Final     RADIOGRAPHIC STUDIES: Nm Pet Image Initial (pi) Skull Base To Thigh  11/29/2014   CLINICAL DATA:  Initial treatment strategy for lymphoma. diffuse large B-cell lymphoma  EXAM: NUCLEAR MEDICINE PET SKULL BASE TO THIGH  TECHNIQUE: 8.6 mCi F-18 FDG was injected intravenously. Full-ring PET imaging was performed from the skull base to thigh after the radiotracer. CT data was obtained and used for attenuation correction and anatomic localization.  FASTING BLOOD GLUCOSE:  Value: 100 mg/dl  COMPARISON:  Abdominal MRI 09/21/2014  FINDINGS: NECK  No hypermetabolic lymph nodes in the neck.  CHEST  No hypermetabolic mediastinal or hilar nodes. No suspicious pulmonary nodules on the CT scan. There is hypermetabolic brown fat through the thoracic inlet and along the thoracic paravertebral fat.  ABDOMEN/PELVIS  Spleen is normal volume and not hypermetabolic. There is no hypermetabolic retroperitoneal or mesenteric lymph nodes.  There is a hypermetabolic lymph node in the left inguinal region measuring 25 mm short axis with SUV max 19.4.  SKELETON  No focal  hypermetabolic activity to suggest skeletal metastasis.  IMPRESSION: 1. Single hypermetabolic left inguinal lymph node. 2. Normal spleen 3. Normal marrow activity.   Electronically Signed   By: Suzy Bouchard M.D.   On: 11/29/2014 15:43    ASSESSMENT/PLAN:    Diffuse large B cell lymphoma Patient received her first cycle R-CHOP chemotherapy today.  She tolerated the CHOP portion of her chemotherapy with no difficulty; but did develop a mild hypersensitivity reaction which consisted of a rash and some throat scratchiness during the Rituxan infusion.  Infusion was held; and patient received hypersensitivity protocol management medications which completely resolved her symptoms.  Patient was able to complete the Rituxan portion of her chemotherapy today.  Patient has plans to return tomorrow for a follow-up visit; and to receive her Neulasta injection.  Patient has plans to return on 12/21/2014 for her next cycle of chemotherapy.   Tobacco abuse Patient continues to smoke on a daily basis.  Patient was once again encouraged to either cut down or completely stop smoking if at all possible.  Advised patient that there was anti--smoking programs available to her if she is interested.   Hypersensitivity reaction Patient received her first cycle of R-CHOP chemotherapy today.  She tolerated the CHOP portion of her chemotherapy with no difficulty; but did develop a mild hypersensitivity reaction which consisted of a rash and some throat scratchiness during the Rituxan infusion.  Infusion was held; and patient received hypersensitivity protocol medications which consisted of Benadryl and Pepcid.  Patient preferred/requested that Solu-Medrol be held; since her makes her feel woozy.  All of patient's symptoms eventually resolved; and patient  was able to complete the Rituxan portion of her chemotherapy today.     Patient stated understanding of all instructions; and was in agreement with this plan of  care. The patient knows to call the clinic  with any problems, questions or concerns.   Review/collaboration with Dr. Alvy Bimler regarding all aspects of patient's visit today.   Total time spent with patient was 40 minutes;  with greater than 75 percent of that time spent in face to face counseling regarding patient's symptoms,  and coordination of care and follow up.  Disclaimer: This note was dictated with voice recognition software. Similar sounding words can inadvertently be transcribed and may not be corrected upon review.   Drue Second, NP 12/01/2014

## 2014-12-01 NOTE — Assessment & Plan Note (Signed)
Patient continues to smoke on a daily basis.  Patient was once again encouraged to either cut down or completely stop smoking if at all possible.  Advised patient that there was anti--smoking programs available to her if she is interested.

## 2014-12-01 NOTE — Telephone Encounter (Signed)
Pt confirmed labs/ov per 02/04 POF, gave pt AVS..... KJ, sent msg to move chemo up due to complications from previous visit, also sent msg to MD confirming MD visit same time but seeing pt in chemo room.Marland KitchenMarland KitchenMarland Kitchen

## 2014-12-01 NOTE — Telephone Encounter (Signed)
Notified Cameo RN that she will follow with patient when she arrives to see Dr. Alvy Bimler today to complete chemo f/u review. HL

## 2014-12-01 NOTE — Progress Notes (Signed)
24 Hour Chemotherapy Follow up Call  Alexandra Allen seen in clinic following administration of RChop chemotherapy. Patient reports flushing, redness of face and chest.  C/o eyes swelling.  Denies any n/v/d.  Denies any fevers. Denies any sob. Denies pain.   Medications reviewed   Pt seen by Dr. Alvy Bimler in office for c/o redness and c/o eyes swelling.   Reviewed all post chemotherapy instructions with patient and when should call clinic or MD.  Patient verbalized understanding.

## 2014-12-01 NOTE — Assessment & Plan Note (Signed)
She had mild hypersensitivity reaction to rituximab which has since resolved. I reassured the patient. She tolerated 40 mg of prednisone well today. I recommend not doing rapid infusion for cycle 2 and will lengthen her chemotherapy infusion time to 7 hours just in case she developed reaction again.

## 2014-12-01 NOTE — Telephone Encounter (Signed)
-----   Message from Braulio Bosch, RN sent at 11/30/2014  5:35 PM EST ----- Regarding: Chemo Follow up First time RCHOP, reacted to rituxan.  Dr. Alvy Bimler.

## 2014-12-01 NOTE — Assessment & Plan Note (Signed)
Patient received her first cycle of R-CHOP chemotherapy today.  She tolerated the CHOP portion of her chemotherapy with no difficulty; but did develop a mild hypersensitivity reaction which consisted of a rash and some throat scratchiness during the Rituxan infusion.  Infusion was held; and patient received hypersensitivity protocol medications which consisted of Benadryl and Pepcid.  Patient preferred/requested that Solu-Medrol be held; since her makes her feel woozy.  All of patient's symptoms eventually resolved; and patient  was able to complete the Rituxan portion of her chemotherapy today.

## 2014-12-01 NOTE — Assessment & Plan Note (Signed)
This has resolved. I reassured the patient.

## 2014-12-01 NOTE — Telephone Encounter (Signed)
Pt left VM states her eyes are puffy this morning and she has redness on her face and chest.  Notified Dr. Alvy Bimler who will see pt this afternoon.  Pt states she took 2 benadryl and is waiting to se if it helps.  She denies any swelling of tongue, lips or throat.  Denies any sob.  Asks if she should take her prednisone this morning.  INstructed she does need to take her prednisone as directed by Dr. Alvy Bimler. Instructed pt to come in at 3:30 pm today to see Dr. Alvy Bimler before her Neulasta injection.  Pt verbalized understanding.

## 2014-12-01 NOTE — Progress Notes (Signed)
[facility] OFFICE PROGRESS NOTE  Patient Care Team: Jonathon Bellows, MD as PCP - General (Family Medicine)  SUMMARY OF ONCOLOGIC HISTORY: Oncology History   Diffuse large B cell lymphoma   Staging form: Lymphoid Neoplasms, AJCC 6th Edition     Clinical stage from 12/01/2014: Stage I - Signed by Heath Lark, MD on 12/01/2014        Diffuse large B cell lymphoma   09/08/2014 Imaging Ultrasound pelvis detected abnormal soft tissue mass in the left groin   09/13/2014 Imaging Ultrasound abdomen showed up to 7 cm mass-like area of mild hypoechogenicity in the left hepatic lobe   09/21/2014 Imaging MRI of the abdomen showed 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   11/23/2014 Bone Marrow Biopsy Accession: [id number] is negative for lymphoma   11/30/2014 -  Chemotherapy She received chemotherapy with RCHOP   11/30/2014 Adverse Reaction Treatment was complicated by mild hypersensitivity reaction to rituximab    INTERVAL HISTORY: Please see below for problem oriented charting. She is seen urgently today because of concern about facial swelling. Since she called this morning, facial swelling has resolved. Her skin rash from reaction yesterday has resolved.  REVIEW OF SYSTEMS:   Constitutional: Denies fevers, chills or abnormal weight loss Eyes: Denies blurriness of vision Ears, nose, mouth, throat, and face: Denies mucositis or sore throat Respiratory: Denies cough, dyspnea or wheezes Cardiovascular: Denies palpitation, chest discomfort or lower extremity swelling Gastrointestinal:  Denies nausea, heartburn or change in bowel habits Lymphatics: Denies new  lymphadenopathy or easy bruising Neurological:Denies numbness, tingling or new weaknesses Behavioral/Psych: Mood is stable, no new changes  All other systems were reviewed with the patient and are negative.  I have reviewed the past medical history, past surgical history, social history and family history with the patient and they are unchanged from previous note.  ALLERGIES:  is allergic to adhesive; influenza vaccines; prednisone; and sulfa antibiotics.  MEDICATIONS:  Current Outpatient Prescriptions  Medication Sig Dispense Refill  . allopurinol (ZYLOPRIM) 300 MG tablet Take 1 tablet (300 mg total) by mouth daily. 30 tablet 0  . Glucosamine-Chondroit-Vit C-Mn (GLUCOSAMINE CHONDR 1500 COMPLX PO) Take 1 tablet by mouth daily.     Marland Kitchen HYDROcodone-acetaminophen (NORCO) 7.5-325 MG per tablet Take 7.5 tablets by mouth.  0  . ibuprofen (ADVIL) 200 MG tablet Take 200 mg by mouth every 6 (six) hours as needed.    . Multiple Vitamins-Minerals (MULTIVITAMIN PO) Take by mouth daily.    . ondansetron (ZOFRAN) 8 MG tablet Take 1 tablet (8 mg total) by mouth every 8 (eight) hours as needed. 30 tablet 1  . oxyCODONE-acetaminophen (ROXICET) 5-325 MG per tablet Take 1-2 tablets by mouth every 4 (four) hours as needed for severe pain. 30 tablet 0  . predniSONE (DELTASONE) 20 MG tablet Take 2 tablets (40 mg total) by mouth daily. Take on days 2-5 of chemotherapy. 48 tablet 0  . prochlorperazine (COMPAZINE) 10 MG tablet Take 1 tablet (10 mg total) by mouth every 6 (six) hours as needed (Nausea or vomiting). 30 tablet 6  . UNABLE TO FIND Take 1 tablet by mouth at bedtime. Med Name: Supplement "DGL"     No current facility-administered medications for this visit.  PHYSICAL EXAMINATION: ECOG PERFORMANCE STATUS: 0 - Asymptomatic  Filed Vitals:   12/01/14 1548  BP: 135/90  Pulse: 72  Temp: 98.2 F (36.8 C)  Resp: 19   Filed Weights   12/01/14 1548  Weight: 158 lb 12.8 oz (72.031 kg)     GENERAL:alert, no distress and comfortable SKIN: skin color, texture, turgor are normal, no rashes or significant lesions EYES: normal, Conjunctiva are pink and non-injected, sclera clear NEURO: alert & oriented x 3 with fluent speech, no focal motor/sensory deficits  LABORATORY DATA:  I have reviewed the data as listed    Component Value Date/Time   NA 142 11/23/2014 0907   NA 141 11/16/2014 0949   K 4.5 11/23/2014 0907   K 4.5 11/16/2014 0949   CL 103 11/16/2014 0949   CO2 21* 11/23/2014 0907   CO2 27 11/16/2014 0949   GLUCOSE 106 11/23/2014 0907   GLUCOSE 101* 11/16/2014 0949   BUN 16.1 11/23/2014 0907   BUN 12 11/16/2014 0949   CREATININE 0.8 11/23/2014 0907   CREATININE 0.87 11/16/2014 0949   CALCIUM 9.9 11/23/2014 0907   CALCIUM 10.0 11/16/2014 0949   PROT 7.8 11/23/2014 0907   PROT 6.8 11/16/2014 0949   ALBUMIN 4.4 11/23/2014 0907   ALBUMIN 4.0 11/16/2014 0949   AST 20 11/23/2014 0907   AST 23 11/16/2014 0949   ALT 30 11/23/2014 0907   ALT 23 11/16/2014 0949   ALKPHOS 50 11/23/2014 0907   ALKPHOS 40 11/16/2014 0949   BILITOT 0.40 11/23/2014 0907   BILITOT 0.6 11/16/2014 0949   GFRNONAA 76* 11/16/2014 0949   GFRAA 88* 11/16/2014 0949    No results found for: SPEP, UPEP  Lab Results  Component Value Date   WBC 6.6 11/23/2014   NEUTROABS 4.1 11/23/2014   HGB 15.7 11/23/2014   HCT 48.8* 11/23/2014   MCV 98.2 11/23/2014   PLT 267 11/23/2014      Chemistry      Component Value Date/Time   NA 142 11/23/2014 0907   NA 141 11/16/2014 0949   K 4.5 11/23/2014 0907   K 4.5 11/16/2014 0949   CL 103 11/16/2014 0949   CO2 21* 11/23/2014 0907   CO2 27 11/16/2014 0949   BUN 16.1 11/23/2014 0907   BUN 12 11/16/2014 0949   CREATININE 0.8 11/23/2014 0907   CREATININE 0.87 11/16/2014 0949      Component Value Date/Time   CALCIUM 9.9 11/23/2014 0907   CALCIUM 10.0 11/16/2014 0949   ALKPHOS 50 11/23/2014 0907   ALKPHOS 40 11/16/2014 0949   AST 20  11/23/2014 0907   AST 23 11/16/2014 0949   ALT 30 11/23/2014 0907   ALT 23 11/16/2014 0949   BILITOT 0.40 11/23/2014 0907   BILITOT 0.6 11/16/2014 0949      ASSESSMENT & PLAN:  Diffuse large B cell lymphoma She had mild hypersensitivity reaction to rituximab which has since resolved. I reassured the patient. She tolerated 40 mg of prednisone well today. I recommend not doing rapid infusion for cycle 2 and will lengthen her chemotherapy infusion time to 7 hours just in case she developed reaction again.    Hypersensitivity reaction This has resolved. I reassured the patient.    No orders of the defined types were placed in this encounter.   All questions were answered. The patient knows to call the clinic with any problems, questions or concerns. No barriers to learning was detected. I spent 15 minutes counseling the patient face to face. The  total time spent in the appointment was 20 minutes and more than 50% was on counseling and review of test results     [facility], Shanekqua Schaper, MD 12/01/2014 4:17 PM

## 2014-12-01 NOTE — Patient Instructions (Signed)
Pegfilgrastim injection What is this medicine? PEGFILGRASTIM (peg fil GRA stim) is a long-acting granulocyte colony-stimulating factor that stimulates the growth of neutrophils, a type of white blood cell important in the body's fight against infection. It is used to reduce the incidence of fever and infection in patients with certain types of cancer who are receiving chemotherapy that affects the bone marrow. This medicine may be used for other purposes; ask your health care provider or pharmacist if you have questions. COMMON BRAND NAME(S): Neulasta What should I tell my health care provider before I take this medicine? They need to know if you have any of these conditions: -latex allergy -ongoing radiation therapy -sickle cell disease -skin reactions to acrylic adhesives (On-Body Injector only) -an unusual or allergic reaction to pegfilgrastim, filgrastim, other medicines, foods, dyes, or preservatives -pregnant or trying to get pregnant -breast-feeding How should I use this medicine? This medicine is for injection under the skin. If you get this medicine at home, you will be taught how to prepare and give the pre-filled syringe or how to use the On-body Injector. Refer to the patient Instructions for Use for detailed instructions. Use exactly as directed. Take your medicine at regular intervals. Do not take your medicine more often than directed. It is important that you put your used needles and syringes in a special sharps container. Do not put them in a trash can. If you do not have a sharps container, call your pharmacist or healthcare provider to get one. Talk to your pediatrician regarding the use of this medicine in children. Special care may be needed. Overdosage: If you think you have taken too much of this medicine contact a poison control center or emergency room at once. NOTE: This medicine is only for you. Do not share this medicine with others. What if I miss a dose? It is  important not to miss your dose. Call your doctor or health care professional if you miss your dose. If you miss a dose due to an On-body Injector failure or leakage, a new dose should be administered as soon as possible using a single prefilled syringe for manual use. What may interact with this medicine? Interactions have not been studied. Give your health care provider a list of all the medicines, herbs, non-prescription drugs, or dietary supplements you use. Also tell them if you smoke, drink alcohol, or use illegal drugs. Some items may interact with your medicine. This list may not describe all possible interactions. Give your health care provider a list of all the medicines, herbs, non-prescription drugs, or dietary supplements you use. Also tell them if you smoke, drink alcohol, or use illegal drugs. Some items may interact with your medicine. What should I watch for while using this medicine? You may need blood work done while you are taking this medicine. If you are going to need a MRI, CT scan, or other procedure, tell your doctor that you are using this medicine (On-Body Injector only). What side effects may I notice from receiving this medicine? Side effects that you should report to your doctor or health care professional as soon as possible: -allergic reactions like skin rash, itching or hives, swelling of the face, lips, or tongue -dizziness -fever -pain, redness, or irritation at site where injected -pinpoint red spots on the skin -shortness of breath or breathing problems -stomach or side pain, or pain at the shoulder -swelling -tiredness -trouble passing urine Side effects that usually do not require medical attention (report to your doctor   or health care professional if they continue or are bothersome): -bone pain -muscle pain This list may not describe all possible side effects. Call your doctor for medical advice about side effects. You may report side effects to FDA at  1-800-FDA-1088. Where should I keep my medicine? Keep out of the reach of children. Store pre-filled syringes in a refrigerator between 2 and 8 degrees C (36 and 46 degrees F). Do not freeze. Keep in carton to protect from light. Throw away this medicine if it is left out of the refrigerator for more than 48 hours. Throw away any unused medicine after the expiration date. NOTE: This sheet is a summary. It may not cover all possible information. If you have questions about this medicine, talk to your doctor, pharmacist, or health care provider.  2015, [employer]. (2014-01-13 16:14:05)  

## 2014-12-09 ENCOUNTER — Encounter (HOSPITAL_COMMUNITY): Payer: Self-pay

## 2014-12-14 ENCOUNTER — Telehealth: Payer: Self-pay | Admitting: *Deleted

## 2014-12-14 NOTE — Telephone Encounter (Signed)
Pt asking if she is supposed to keep taking her allopurinol?  Instructed pt to take until gone,  For 30 days total, as prescribed by Dr. Alvy Bimler.  She verbalized understanding.

## 2014-12-20 ENCOUNTER — Other Ambulatory Visit: Payer: Self-pay | Admitting: Hematology and Oncology

## 2014-12-20 DIAGNOSIS — C8330 Diffuse large B-cell lymphoma, unspecified site: Secondary | ICD-10-CM

## 2014-12-21 ENCOUNTER — Other Ambulatory Visit (HOSPITAL_BASED_OUTPATIENT_CLINIC_OR_DEPARTMENT_OTHER): Payer: BLUE CROSS/BLUE SHIELD

## 2014-12-21 ENCOUNTER — Ambulatory Visit (HOSPITAL_BASED_OUTPATIENT_CLINIC_OR_DEPARTMENT_OTHER): Payer: BLUE CROSS/BLUE SHIELD

## 2014-12-21 ENCOUNTER — Telehealth: Payer: Self-pay | Admitting: Hematology and Oncology

## 2014-12-21 ENCOUNTER — Ambulatory Visit (HOSPITAL_BASED_OUTPATIENT_CLINIC_OR_DEPARTMENT_OTHER): Payer: BLUE CROSS/BLUE SHIELD | Admitting: Hematology and Oncology

## 2014-12-21 ENCOUNTER — Ambulatory Visit: Payer: BLUE CROSS/BLUE SHIELD

## 2014-12-21 ENCOUNTER — Other Ambulatory Visit: Payer: BLUE CROSS/BLUE SHIELD

## 2014-12-21 VITALS — BP 132/82 | HR 76 | Temp 98.6°F | Resp 18 | Ht 63.0 in | Wt 153.6 lb

## 2014-12-21 DIAGNOSIS — Z5112 Encounter for antineoplastic immunotherapy: Secondary | ICD-10-CM

## 2014-12-21 DIAGNOSIS — Z95828 Presence of other vascular implants and grafts: Secondary | ICD-10-CM

## 2014-12-21 DIAGNOSIS — Z720 Tobacco use: Secondary | ICD-10-CM

## 2014-12-21 DIAGNOSIS — C8330 Diffuse large B-cell lymphoma, unspecified site: Secondary | ICD-10-CM

## 2014-12-21 DIAGNOSIS — Z5111 Encounter for antineoplastic chemotherapy: Secondary | ICD-10-CM

## 2014-12-21 LAB — CBC WITH DIFFERENTIAL/PLATELET
BASO%: 1.4 % (ref 0.0–2.0)
BASOS ABS: 0.1 10*3/uL (ref 0.0–0.1)
EOS%: 0.5 % (ref 0.0–7.0)
Eosinophils Absolute: 0 10*3/uL (ref 0.0–0.5)
HEMATOCRIT: 39.5 % (ref 34.8–46.6)
HEMOGLOBIN: 13.3 g/dL (ref 11.6–15.9)
LYMPH%: 22.9 % (ref 14.0–49.7)
MCH: 32.1 pg (ref 25.1–34.0)
MCHC: 33.7 g/dL (ref 31.5–36.0)
MCV: 95.4 fL (ref 79.5–101.0)
MONO#: 0.6 10*3/uL (ref 0.1–0.9)
MONO%: 8.7 % (ref 0.0–14.0)
NEUT%: 66.5 % (ref 38.4–76.8)
NEUTROS ABS: 4.2 10*3/uL (ref 1.5–6.5)
PLATELETS: 343 10*3/uL (ref 145–400)
RBC: 4.14 10*6/uL (ref 3.70–5.45)
RDW: 15 % — AB (ref 11.2–14.5)
WBC: 6.3 10*3/uL (ref 3.9–10.3)
lymph#: 1.5 10*3/uL (ref 0.9–3.3)

## 2014-12-21 LAB — LACTATE DEHYDROGENASE (CC13): LDH: 198 U/L (ref 125–245)

## 2014-12-21 LAB — COMPREHENSIVE METABOLIC PANEL (CC13)
ALT: 32 U/L (ref 0–55)
AST: 21 U/L (ref 5–34)
Albumin: 3.8 g/dL (ref 3.5–5.0)
Alkaline Phosphatase: 60 U/L (ref 40–150)
Anion Gap: 10 mEq/L (ref 3–11)
BILIRUBIN TOTAL: 0.29 mg/dL (ref 0.20–1.20)
BUN: 15.5 mg/dL (ref 7.0–26.0)
CALCIUM: 9.8 mg/dL (ref 8.4–10.4)
CHLORIDE: 107 meq/L (ref 98–109)
CO2: 25 mEq/L (ref 22–29)
Creatinine: 0.7 mg/dL (ref 0.6–1.1)
GLUCOSE: 117 mg/dL (ref 70–140)
Potassium: 3.8 mEq/L (ref 3.5–5.1)
Sodium: 142 mEq/L (ref 136–145)
TOTAL PROTEIN: 6.7 g/dL (ref 6.4–8.3)

## 2014-12-21 MED ORDER — SODIUM CHLORIDE 0.9 % IV SOLN
750.0000 mg/m2 | Freq: Once | INTRAVENOUS | Status: AC
  Administered 2014-12-21: 1320 mg via INTRAVENOUS
  Filled 2014-12-21: qty 66

## 2014-12-21 MED ORDER — SODIUM CHLORIDE 0.9 % IV SOLN
Freq: Once | INTRAVENOUS | Status: AC
  Administered 2014-12-21: 09:00:00 via INTRAVENOUS

## 2014-12-21 MED ORDER — DIPHENHYDRAMINE HCL 25 MG PO CAPS
50.0000 mg | ORAL_CAPSULE | Freq: Once | ORAL | Status: AC
  Administered 2014-12-21: 50 mg via ORAL

## 2014-12-21 MED ORDER — ACETAMINOPHEN 325 MG PO TABS
650.0000 mg | ORAL_TABLET | Freq: Once | ORAL | Status: AC
  Administered 2014-12-21: 650 mg via ORAL

## 2014-12-21 MED ORDER — DIPHENHYDRAMINE HCL 25 MG PO CAPS
ORAL_CAPSULE | ORAL | Status: AC
  Filled 2014-12-21: qty 2

## 2014-12-21 MED ORDER — OXYCODONE-ACETAMINOPHEN 5-325 MG PO TABS: 1.0000 | ORAL_TABLET | ORAL | Status: DC | PRN

## 2014-12-21 MED ORDER — ONDANSETRON 16 MG/50ML IVPB (CHCC)
INTRAVENOUS | Status: AC
  Filled 2014-12-21: qty 16

## 2014-12-21 MED ORDER — HEPARIN SOD (PORK) LOCK FLUSH 100 UNIT/ML IV SOLN
500.0000 [IU] | Freq: Once | INTRAVENOUS | Status: AC | PRN
  Administered 2014-12-21: 500 [IU]
  Filled 2014-12-21: qty 5

## 2014-12-21 MED ORDER — SODIUM CHLORIDE 0.9 % IV SOLN
375.0000 mg/m2 | Freq: Once | INTRAVENOUS | Status: AC
  Administered 2014-12-21: 700 mg via INTRAVENOUS
  Filled 2014-12-21: qty 70

## 2014-12-21 MED ORDER — DEXAMETHASONE SODIUM PHOSPHATE 20 MG/5ML IJ SOLN
20.0000 mg | Freq: Once | INTRAMUSCULAR | Status: AC
  Administered 2014-12-21: 20 mg via INTRAVENOUS

## 2014-12-21 MED ORDER — DEXAMETHASONE SODIUM PHOSPHATE 20 MG/5ML IJ SOLN
INTRAMUSCULAR | Status: AC
  Filled 2014-12-21: qty 5

## 2014-12-21 MED ORDER — SODIUM CHLORIDE 0.9 % IJ SOLN
10.0000 mL | INTRAMUSCULAR | Status: DC | PRN
  Administered 2014-12-21: 10 mL via INTRAVENOUS
  Filled 2014-12-21: qty 10

## 2014-12-21 MED ORDER — ONDANSETRON 16 MG/50ML IVPB (CHCC)
16.0000 mg | Freq: Once | INTRAVENOUS | Status: AC
  Administered 2014-12-21: 16 mg via INTRAVENOUS

## 2014-12-21 MED ORDER — DOXORUBICIN HCL CHEMO IV INJECTION 2 MG/ML
50.0000 mg/m2 | Freq: Once | INTRAVENOUS | Status: AC
  Administered 2014-12-21: 88 mg via INTRAVENOUS
  Filled 2014-12-21: qty 44

## 2014-12-21 MED ORDER — ACETAMINOPHEN 325 MG PO TABS
ORAL_TABLET | ORAL | Status: AC
  Filled 2014-12-21: qty 2

## 2014-12-21 MED ORDER — SODIUM CHLORIDE 0.9 % IJ SOLN
10.0000 mL | INTRAMUSCULAR | Status: DC | PRN
  Administered 2014-12-21: 10 mL
  Filled 2014-12-21: qty 10

## 2014-12-21 MED ORDER — VINCRISTINE SULFATE CHEMO INJECTION 1 MG/ML
2.0000 mg | Freq: Once | INTRAVENOUS | Status: AC
  Administered 2014-12-21: 2 mg via INTRAVENOUS
  Filled 2014-12-21: qty 2

## 2014-12-21 NOTE — Telephone Encounter (Signed)
Pt confirmed labs/ov per 02/24 POF, gave pt AVS.... KJ  °

## 2014-12-21 NOTE — Progress Notes (Signed)
Pt tolerated rituxan infusion on second treatment without problems.

## 2014-12-21 NOTE — Assessment & Plan Note (Addendum)
She tolerated treatment well. I gave her prescription for cranial prosthesis. I will continue same treatment without dose adjustment.

## 2014-12-21 NOTE — Assessment & Plan Note (Signed)
I spent some time counseling the patient the importance of tobacco cessation. She is currently not interested to quit now. 

## 2014-12-21 NOTE — Patient Instructions (Signed)
[facility] Discharge Instructions for Patients Receiving Chemotherapy  Today you received the following chemotherapy agents Rituxan/Adriamycin/Vincristine/ Cytoxan To help prevent nausea and vomiting after your treatment, we encourage you to take your nausea medication as prescribed.If you develop nausea and vomiting that is not controlled by your nausea medication, call the clinic.   BELOW ARE SYMPTOMS THAT SHOULD BE REPORTED IMMEDIATELY:  *FEVER GREATER THAN 100.5 F  *CHILLS WITH OR WITHOUT FEVER  NAUSEA AND VOMITING THAT IS NOT CONTROLLED WITH YOUR NAUSEA MEDICATION  *UNUSUAL SHORTNESS OF BREATH  *UNUSUAL BRUISING OR BLEEDING  TENDERNESS IN MOUTH AND THROAT WITH OR WITHOUT PRESENCE OF ULCERS  *URINARY PROBLEMS  *BOWEL PROBLEMS  UNUSUAL RASH Items with * indicate a potential emergency and should be followed up as soon as possible.  Feel free to call the clinic you have any questions or concerns. The clinic phone number is [phone].

## 2014-12-21 NOTE — Patient Instructions (Signed)

## 2014-12-21 NOTE — Progress Notes (Signed)
[facility] OFFICE PROGRESS NOTE  Patient Care Team: Jonathon Bellows, MD as PCP - General (Family Medicine)  SUMMARY OF ONCOLOGIC HISTORY: Oncology History   Diffuse large B cell lymphoma   Staging form: Lymphoid Neoplasms, AJCC 6th Edition     Clinical stage from 12/01/2014: Stage I - Signed by Heath Lark, MD on 12/01/2014        Diffuse large B cell lymphoma   09/08/2014 Imaging Ultrasound pelvis detected abnormal soft tissue mass in the left groin   09/13/2014 Imaging Ultrasound abdomen showed up to 7 cm mass-like area of mild hypoechogenicity in the left hepatic lobe   09/21/2014 Imaging MRI of the abdomen showed 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   11/23/2014 Bone Marrow Biopsy Accession: [id number] is negative for lymphoma   11/30/2014 -  Chemotherapy She received chemotherapy with [facility]   11/30/2014 Adverse Reaction Treatment was complicated by mild hypersensitivity reaction to rituximab    INTERVAL HISTORY: Please see below for problem oriented charting. She is seen prior to cycle 2 of treatment. She denies side effects from Reagan loss.  REVIEW OF SYSTEMS:   Constitutional: Denies fevers, chills or abnormal weight loss Eyes: Denies blurriness of vision Ears, nose, mouth, throat, and face: Denies mucositis or sore throat Respiratory: Denies cough, dyspnea or wheezes Cardiovascular: Denies palpitation, chest discomfort or lower extremity swelling Gastrointestinal:  Denies nausea, heartburn or change in bowel habits Skin: Denies abnormal skin rashes Lymphatics: Denies new lymphadenopathy or easy bruising Neurological:Denies numbness,  tingling or new weaknesses Behavioral/Psych: Mood is stable, no new changes  All other systems were reviewed with the patient and are negative.  I have reviewed the past medical history, past surgical history, social history and family history with the patient and they are unchanged from previous note.  ALLERGIES:  is allergic to adhesive; influenza vaccines; prednisone; and sulfa antibiotics.  MEDICATIONS:  Current Outpatient Prescriptions  Medication Sig Dispense Refill  . allopurinol (ZYLOPRIM) 300 MG tablet Take 1 tablet (300 mg total) by mouth daily. 30 tablet 0  . Glucosamine-Chondroit-Vit C-Mn (GLUCOSAMINE CHONDR 1500 COMPLX PO) Take 1 tablet by mouth daily.     Marland Kitchen HYDROcodone-acetaminophen (NORCO) 7.5-325 MG per tablet Take 7.5 tablets by mouth.  0  . ibuprofen (ADVIL) 200 MG tablet Take 200 mg by mouth every 6 (six) hours as needed.    . Multiple Vitamins-Minerals (MULTIVITAMIN PO) Take by mouth daily.    . ondansetron (ZOFRAN) 8 MG tablet Take 1 tablet (8 mg total) by mouth every 8 (eight) hours as needed. 30 tablet 1  . oxyCODONE-acetaminophen (ROXICET) 5-325 MG per tablet Take 1 tablet by mouth every 4 (four) hours as needed for severe pain. 60 tablet 0  . predniSONE (DELTASONE) 20 MG tablet Take 2 tablets (40 mg total) by mouth daily. Take on days 2-5 of chemotherapy. 48 tablet 0  . prochlorperazine (COMPAZINE) 10 MG tablet Take 1 tablet (10 mg total) by mouth every 6 (six) hours as needed (Nausea or vomiting). 30 tablet 6  . UNABLE TO FIND Take 1 tablet by mouth at bedtime. Med Name: Supplement "DGL"     No current facility-administered medications for this visit.   Facility-Administered Medications Ordered in Other  Visits  Medication Dose Route Frequency Provider Last Rate Last Dose  . sodium chloride 0.9 % injection 10 mL  10 mL Intracatheter PRN Heath Lark, MD   10 mL at 12/21/14 1452    PHYSICAL EXAMINATION: ECOG PERFORMANCE STATUS: 0 - Asymptomatic  Filed Vitals:    12/21/14 0831  BP: 132/82  Pulse: 76  Temp: 98.6 F (37 C)  Resp: 18   Filed Weights   12/21/14 0831  Weight: 153 lb 9.6 oz (69.673 kg)    GENERAL:alert, no distress and comfortable SKIN: skin color, texture, turgor are normal, no rashes or significant lesions EYES: normal, Conjunctiva are pink and non-injected, sclera clear OROPHARYNX:no exudate, no erythema and lips, buccal mucosa, and tongue normal  NECK: supple, thyroid normal size, non-tender, without nodularity LYMPH:persistent palpable lymphadenopathy in the left inguinal region. LUNGS: clear to auscultation and percussion with normal breathing effort HEART: regular rate & rhythm and no murmurs and no lower extremity edema ABDOMEN:abdomen soft, non-tender and normal bowel sounds Musculoskeletal:no cyanosis of digits and no clubbing  NEURO: alert & oriented x 3 with fluent speech, no focal motor/sensory deficits  LABORATORY DATA:  I have reviewed the data as listed    Component Value Date/Time   NA 142 12/21/2014 0807   NA 141 11/16/2014 0949   K 3.8 12/21/2014 0807   K 4.5 11/16/2014 0949   CL 103 11/16/2014 0949   CO2 25 12/21/2014 0807   CO2 27 11/16/2014 0949   GLUCOSE 117 12/21/2014 0807   GLUCOSE 101* 11/16/2014 0949   BUN 15.5 12/21/2014 0807   BUN 12 11/16/2014 0949   CREATININE 0.7 12/21/2014 0807   CREATININE 0.87 11/16/2014 0949   CALCIUM 9.8 12/21/2014 0807   CALCIUM 10.0 11/16/2014 0949   PROT 6.7 12/21/2014 0807   PROT 6.8 11/16/2014 0949   ALBUMIN 3.8 12/21/2014 0807   ALBUMIN 4.0 11/16/2014 0949   AST 21 12/21/2014 0807   AST 23 11/16/2014 0949   ALT 32 12/21/2014 0807   ALT 23 11/16/2014 0949   ALKPHOS 60 12/21/2014 0807   ALKPHOS 40 11/16/2014 0949   BILITOT 0.29 12/21/2014 0807   BILITOT 0.6 11/16/2014 0949   GFRNONAA 76* 11/16/2014 0949   GFRAA 88* 11/16/2014 0949    No results found for: SPEP, UPEP  Lab Results  Component Value Date   WBC 6.3 12/21/2014   NEUTROABS 4.2  12/21/2014   HGB 13.3 12/21/2014   HCT 39.5 12/21/2014   MCV 95.4 12/21/2014   PLT 343 12/21/2014      Chemistry      Component Value Date/Time   NA 142 12/21/2014 0807   NA 141 11/16/2014 0949   K 3.8 12/21/2014 0807   K 4.5 11/16/2014 0949   CL 103 11/16/2014 0949   CO2 25 12/21/2014 0807   CO2 27 11/16/2014 0949   BUN 15.5 12/21/2014 0807   BUN 12 11/16/2014 0949   CREATININE 0.7 12/21/2014 0807   CREATININE 0.87 11/16/2014 0949      Component Value Date/Time   CALCIUM 9.8 12/21/2014 0807   CALCIUM 10.0 11/16/2014 0949   ALKPHOS 60 12/21/2014 0807   ALKPHOS 40 11/16/2014 0949   AST 21 12/21/2014 0807   AST 23 11/16/2014 0949   ALT 32 12/21/2014 0807   ALT 23 11/16/2014 0949   BILITOT 0.29 12/21/2014 0807   BILITOT 0.6 11/16/2014 0949      ASSESSMENT & PLAN:  Diffuse large B cell lymphoma She tolerated treatment well. I gave her  prescription for cranial prosthesis. I will continue same treatment without dose adjustment.   Tobacco abuse I spent some time counseling the patient the importance of tobacco cessation. She is currently not interested to quit now.     No orders of the defined types were placed in this encounter.   All questions were answered. The patient knows to call the clinic with any problems, questions or concerns. No barriers to learning was detected. I spent 15 minutes counseling the patient face to face. The total time spent in the appointment was 20 minutes and more than 50% was on counseling and review of test results     [facility], Leavy Heatherly, MD 12/21/2014 5:04 PM

## 2014-12-22 ENCOUNTER — Ambulatory Visit (HOSPITAL_BASED_OUTPATIENT_CLINIC_OR_DEPARTMENT_OTHER): Payer: BLUE CROSS/BLUE SHIELD

## 2014-12-22 DIAGNOSIS — Z5189 Encounter for other specified aftercare: Secondary | ICD-10-CM

## 2014-12-22 DIAGNOSIS — C8330 Diffuse large B-cell lymphoma, unspecified site: Secondary | ICD-10-CM

## 2014-12-22 MED ORDER — PEGFILGRASTIM INJECTION 6 MG/0.6ML ~~LOC~~
6.0000 mg | PREFILLED_SYRINGE | Freq: Once | SUBCUTANEOUS | Status: AC
  Administered 2014-12-22: 6 mg via SUBCUTANEOUS
  Filled 2014-12-22: qty 0.6

## 2014-12-26 ENCOUNTER — Telehealth: Payer: Self-pay | Admitting: *Deleted

## 2014-12-26 NOTE — Telephone Encounter (Signed)
Message retrieved from patient asking advice on prednisone.  "Order was to take prednisone 40 mg day two through five after treatment.  Alexandra Allen reports she only took one pills.  Would like to know if she should continue taking the prednisone or let it be."  [facility]-Rituxan began on 12-21-2014.  Will notify Dr. Alvy Bimler.

## 2014-12-26 NOTE — Telephone Encounter (Signed)
Pt states she only took 1 tablet days 2-5 rather than 2 tablets. Dr Alvy Bimler says that is fine, do not worry about it.

## 2015-01-05 ENCOUNTER — Ambulatory Visit: Payer: BLUE CROSS/BLUE SHIELD | Admitting: Nutrition

## 2015-01-05 NOTE — Progress Notes (Signed)
Patient stopped by my office requesting samples of oral nutrition supplements.  Patient is a 54 year old female diagnosed with B-cell lymphoma.  Past medical history includes thyroid disease, and GERD.  Medications include multivitamin, Zofran, prednisone, and Compazine.  Labs were reviewed.  Height: 63 inches. Weight: 153.6 pounds Usual body weight: 155 pounds. BMI: 27.2 to  Patient reports she has stomach pain/nausea.   She has severe reflux but cannot take prescription medications. She drinks 5 boost daily.  This is becoming too expensive to continue. Patient requesting oral nutrition supplement samples.  Nutrition diagnosis: Food and nutrition related knowledge deficit related to diagnosis of B-cell lymphoma as evidenced by no prior need for nutrition related information.  Intervention: Patient educated on strategies for improving nausea and reflux. Provided fact sheets. Provided oral nutrition supplement samples along with coupons. Contact information was provided.  Teach back method used.  Monitoring, evaluation, goals.  Patient will tolerate adequate calories and protein to minimize weight loss throughout treatment.    Next visit: Wednesday, March 16, during infusion.  **Disclaimer: This note was dictated with voice recognition software. Similar sounding words can inadvertently be transcribed and this note may contain transcription errors which may not have been corrected upon publication of note.**

## 2015-01-11 ENCOUNTER — Other Ambulatory Visit (HOSPITAL_BASED_OUTPATIENT_CLINIC_OR_DEPARTMENT_OTHER): Payer: BLUE CROSS/BLUE SHIELD

## 2015-01-11 ENCOUNTER — Ambulatory Visit (HOSPITAL_BASED_OUTPATIENT_CLINIC_OR_DEPARTMENT_OTHER): Payer: BLUE CROSS/BLUE SHIELD

## 2015-01-11 ENCOUNTER — Ambulatory Visit: Payer: BLUE CROSS/BLUE SHIELD

## 2015-01-11 ENCOUNTER — Encounter: Payer: Self-pay | Admitting: Hematology and Oncology

## 2015-01-11 ENCOUNTER — Telehealth: Payer: Self-pay | Admitting: Hematology and Oncology

## 2015-01-11 ENCOUNTER — Ambulatory Visit (HOSPITAL_BASED_OUTPATIENT_CLINIC_OR_DEPARTMENT_OTHER): Payer: BLUE CROSS/BLUE SHIELD | Admitting: Hematology and Oncology

## 2015-01-11 ENCOUNTER — Ambulatory Visit: Payer: BLUE CROSS/BLUE SHIELD | Admitting: Nutrition

## 2015-01-11 VITALS — BP 111/60 | HR 64 | Temp 98.3°F | Resp 18 | Ht 63.0 in | Wt 153.3 lb

## 2015-01-11 DIAGNOSIS — C8330 Diffuse large B-cell lymphoma, unspecified site: Secondary | ICD-10-CM | POA: Diagnosis not present

## 2015-01-11 DIAGNOSIS — Z95828 Presence of other vascular implants and grafts: Secondary | ICD-10-CM

## 2015-01-11 DIAGNOSIS — Z5112 Encounter for antineoplastic immunotherapy: Secondary | ICD-10-CM

## 2015-01-11 DIAGNOSIS — Z5111 Encounter for antineoplastic chemotherapy: Secondary | ICD-10-CM

## 2015-01-11 LAB — CBC WITH DIFFERENTIAL/PLATELET
BASO%: 1.2 % (ref 0.0–2.0)
Basophils Absolute: 0.1 10*3/uL (ref 0.0–0.1)
EOS%: 0.5 % (ref 0.0–7.0)
Eosinophils Absolute: 0 10*3/uL (ref 0.0–0.5)
HCT: 37.6 % (ref 34.8–46.6)
HEMOGLOBIN: 12.3 g/dL (ref 11.6–15.9)
LYMPH%: 15.6 % (ref 14.0–49.7)
MCH: 32 pg (ref 25.1–34.0)
MCHC: 32.8 g/dL (ref 31.5–36.0)
MCV: 97.5 fL (ref 79.5–101.0)
MONO#: 0.5 10*3/uL (ref 0.1–0.9)
MONO%: 9.2 % (ref 0.0–14.0)
NEUT%: 73.5 % (ref 38.4–76.8)
NEUTROS ABS: 4.2 10*3/uL (ref 1.5–6.5)
PLATELETS: 312 10*3/uL (ref 145–400)
RBC: 3.86 10*6/uL (ref 3.70–5.45)
RDW: 16.1 % — ABNORMAL HIGH (ref 11.2–14.5)
WBC: 5.7 10*3/uL (ref 3.9–10.3)
lymph#: 0.9 10*3/uL (ref 0.9–3.3)

## 2015-01-11 LAB — COMPREHENSIVE METABOLIC PANEL (CC13)
ALT: 33 U/L (ref 0–55)
AST: 19 U/L (ref 5–34)
Albumin: 3.7 g/dL (ref 3.5–5.0)
Alkaline Phosphatase: 61 U/L (ref 40–150)
Anion Gap: 9 mEq/L (ref 3–11)
BUN: 14.9 mg/dL (ref 7.0–26.0)
CALCIUM: 9.2 mg/dL (ref 8.4–10.4)
CO2: 25 mEq/L (ref 22–29)
CREATININE: 0.8 mg/dL (ref 0.6–1.1)
Chloride: 108 mEq/L (ref 98–109)
EGFR: 86 mL/min/{1.73_m2} — AB (ref >90)
GLUCOSE: 133 mg/dL (ref 70–140)
POTASSIUM: 3.8 meq/L (ref 3.5–5.1)
SODIUM: 143 meq/L (ref 136–145)
Total Bilirubin: 0.36 mg/dL (ref 0.20–1.20)
Total Protein: 6.5 g/dL (ref 6.4–8.3)

## 2015-01-11 LAB — LACTATE DEHYDROGENASE (CC13): LDH: 200 U/L (ref 125–245)

## 2015-01-11 MED ORDER — DIPHENHYDRAMINE HCL 25 MG PO CAPS
50.0000 mg | ORAL_CAPSULE | Freq: Once | ORAL | Status: AC
  Administered 2015-01-11: 50 mg via ORAL

## 2015-01-11 MED ORDER — SODIUM CHLORIDE 0.9 % IV SOLN
2.0000 mg | Freq: Once | INTRAVENOUS | Status: AC
  Administered 2015-01-11: 2 mg via INTRAVENOUS
  Filled 2015-01-11: qty 2

## 2015-01-11 MED ORDER — SODIUM CHLORIDE 0.9 % IJ SOLN
10.0000 mL | INTRAMUSCULAR | Status: DC | PRN
  Administered 2015-01-11: 10 mL via INTRAVENOUS
  Filled 2015-01-11: qty 10

## 2015-01-11 MED ORDER — ACETAMINOPHEN 325 MG PO TABS
650.0000 mg | ORAL_TABLET | Freq: Once | ORAL | Status: AC
  Administered 2015-01-11: 650 mg via ORAL

## 2015-01-11 MED ORDER — SODIUM CHLORIDE 0.9 % IJ SOLN
10.0000 mL | INTRAMUSCULAR | Status: DC | PRN
  Administered 2015-01-11: 10 mL
  Filled 2015-01-11: qty 10

## 2015-01-11 MED ORDER — SODIUM CHLORIDE 0.9 % IV SOLN
750.0000 mg/m2 | Freq: Once | INTRAVENOUS | Status: AC
  Administered 2015-01-11: 1320 mg via INTRAVENOUS
  Filled 2015-01-11: qty 66

## 2015-01-11 MED ORDER — SODIUM CHLORIDE 0.9 % IV SOLN
Freq: Once | INTRAVENOUS | Status: AC
  Administered 2015-01-11: 10:00:00 via INTRAVENOUS
  Filled 2015-01-11: qty 8

## 2015-01-11 MED ORDER — HEPARIN SOD (PORK) LOCK FLUSH 100 UNIT/ML IV SOLN
500.0000 [IU] | Freq: Once | INTRAVENOUS | Status: AC | PRN
  Administered 2015-01-11: 500 [IU]
  Filled 2015-01-11: qty 5

## 2015-01-11 MED ORDER — SODIUM CHLORIDE 0.9 % IV SOLN
375.0000 mg/m2 | Freq: Once | INTRAVENOUS | Status: AC
  Administered 2015-01-11: 700 mg via INTRAVENOUS
  Filled 2015-01-11: qty 70

## 2015-01-11 MED ORDER — SODIUM CHLORIDE 0.9 % IV SOLN
Freq: Once | INTRAVENOUS | Status: AC
  Administered 2015-01-11: 10:00:00 via INTRAVENOUS

## 2015-01-11 MED ORDER — DOXORUBICIN HCL CHEMO IV INJECTION 2 MG/ML
50.0000 mg/m2 | Freq: Once | INTRAVENOUS | Status: AC
  Administered 2015-01-11: 88 mg via INTRAVENOUS
  Filled 2015-01-11: qty 44

## 2015-01-11 MED ORDER — ACETAMINOPHEN 325 MG PO TABS
ORAL_TABLET | ORAL | Status: AC
  Filled 2015-01-11: qty 2

## 2015-01-11 MED ORDER — DIPHENHYDRAMINE HCL 25 MG PO CAPS
ORAL_CAPSULE | ORAL | Status: AC
  Filled 2015-01-11: qty 2

## 2015-01-11 NOTE — Patient Instructions (Signed)
[facility] Discharge Instructions for Patients Receiving Chemotherapy  Today you received the following chemotherapy agents: Adriamycin, Vincristine, Cytoxan, and Rituxan.   To help prevent nausea and vomiting after your treatment, we encourage you to take your nausea medication as prescribed.   If you develop nausea and vomiting that is not controlled by your nausea medication, call the clinic.   BELOW ARE SYMPTOMS THAT SHOULD BE REPORTED IMMEDIATELY:  *FEVER GREATER THAN 100.5 F  *CHILLS WITH OR WITHOUT FEVER  NAUSEA AND VOMITING THAT IS NOT CONTROLLED WITH YOUR NAUSEA MEDICATION  *UNUSUAL SHORTNESS OF BREATH  *UNUSUAL BRUISING OR BLEEDING  TENDERNESS IN MOUTH AND THROAT WITH OR WITHOUT PRESENCE OF ULCERS  *URINARY PROBLEMS  *BOWEL PROBLEMS  UNUSUAL RASH Items with * indicate a potential emergency and should be followed up as soon as possible.  Feel free to call the clinic you have any questions or concerns. The clinic phone number is [phone].     Doxorubicin injection What is this medicine? DOXORUBICIN (dox oh ROO bi sin) is a chemotherapy drug. It is used to treat many kinds of cancer like Hodgkin's disease, leukemia, non-Hodgkin's lymphoma, neuroblastoma, sarcoma, and Wilms' tumor. It is also used to treat bladder cancer, breast cancer, lung cancer, ovarian cancer, stomach cancer, and thyroid cancer. This medicine may be used for other purposes; ask your health care provider or pharmacist if you have questions. COMMON BRAND NAME(S): Adriamycin, Adriamycin PFS, Adriamycin RDF, Rubex What should I tell my health care provider before I take this medicine? They need to know if you have any of these conditions: -blood disorders -heart disease, recent heart attack -infection (especially a virus infection such as chickenpox, cold sores, or herpes) -irregular heartbeat -liver disease -recent or ongoing radiation therapy -an unusual or allergic  reaction to doxorubicin, other chemotherapy agents, other medicines, foods, dyes, or preservatives -pregnant or trying to get pregnant -breast-feeding How should I use this medicine? This drug is given as an infusion into a vein. It is administered in a hospital or clinic by a specially trained health care professional. If you have pain, swelling, burning or any unusual feeling around the site of your injection, tell your health care professional right away. Talk to your pediatrician regarding the use of this medicine in children. Special care may be needed. Overdosage: If you think you have taken too much of this medicine contact a poison control center or emergency room at once. NOTE: This medicine is only for you. Do not share this medicine with others. What if I miss a dose? It is important not to miss your dose. Call your doctor or health care professional if you are unable to keep an appointment. What may interact with this medicine? Do not take this medicine with any of the following medications: -cisapride -droperidol -halofantrine -pimozide -zidovudine This medicine may also interact with the following medications: -chloroquine -chlorpromazine -clarithromycin -cyclophosphamide -cyclosporine -erythromycin -medicines for depression, anxiety, or psychotic disturbances -medicines for irregular heart beat like amiodarone, bepridil, dofetilide, encainide, flecainide, propafenone, quinidine -medicines for seizures like ethotoin, fosphenytoin, phenytoin -medicines for nausea, vomiting like dolasetron, ondansetron, palonosetron -medicines to increase blood counts like filgrastim, pegfilgrastim, sargramostim -methadone -methotrexate -pentamidine -progesterone -vaccines -verapamil Talk to your doctor or health care professional before taking any of these medicines: -acetaminophen -aspirin -ibuprofen -ketoprofen -naproxen This list may not describe all possible interactions.  Give your health care provider a list of all the medicines, herbs, non-prescription drugs, or dietary supplements you use. Also tell them  if you smoke, drink alcohol, or use illegal drugs. Some items may interact with your medicine. What should I watch for while using this medicine? Your condition will be monitored carefully while you are receiving this medicine. You will need important blood work done while you are taking this medicine. This drug may make you feel generally unwell. This is not uncommon, as chemotherapy can affect healthy cells as well as cancer cells. Report any side effects. Continue your course of treatment even though you feel ill unless your doctor tells you to stop. Your urine may turn red for a few days after your dose. This is not blood. If your urine is dark or brown, call your doctor. In some cases, you may be given additional medicines to help with side effects. Follow all directions for their use. Call your doctor or health care professional for advice if you get a fever, chills or sore throat, or other symptoms of a cold or flu. Do not treat yourself. This drug decreases your body's ability to fight infections. Try to avoid being around people who are sick. This medicine may increase your risk to bruise or bleed. Call your doctor or health care professional if you notice any unusual bleeding. Be careful brushing and flossing your teeth or using a toothpick because you may get an infection or bleed more easily. If you have any dental work done, tell your dentist you are receiving this medicine. Avoid taking products that contain aspirin, acetaminophen, ibuprofen, naproxen, or ketoprofen unless instructed by your doctor. These medicines may hide a fever. Men and women of childbearing age should use effective birth control methods while using taking this medicine. Do not become pregnant while taking this medicine. There is a potential for serious side effects to an unborn child.  Talk to your health care professional or pharmacist for more information. Do not breast-feed an infant while taking this medicine. Do not let others touch your urine or other body fluids for 5 days after each treatment with this medicine. Caregivers should wear latex gloves to avoid touching body fluids during this time. There is a maximum amount of this medicine you should receive throughout your life. The amount depends on the medical condition being treated and your overall health. Your doctor will watch how much of this medicine you receive in your lifetime. Tell your doctor if you have taken this medicine before. What side effects may I notice from receiving this medicine? Side effects that you should report to your doctor or health care professional as soon as possible: -allergic reactions like skin rash, itching or hives, swelling of the face, lips, or tongue -low blood counts - this medicine may decrease the number of white blood cells, red blood cells and platelets. You may be at increased risk for infections and bleeding. -signs of infection - fever or chills, cough, sore throat, pain or difficulty passing urine -signs of decreased platelets or bleeding - bruising, pinpoint red spots on the skin, black, tarry stools, blood in the urine -signs of decreased red blood cells - unusually weak or tired, fainting spells, lightheadedness -breathing problems -chest pain -fast, irregular heartbeat -mouth sores -nausea, vomiting -pain, swelling, redness at site where injected -pain, tingling, numbness in the hands or feet -swelling of ankles, feet, or hands -unusual bleeding or bruising Side effects that usually do not require medical attention (report to your doctor or health care professional if they continue or are bothersome): -diarrhea -facial flushing -hair loss -loss of appetite -missed  menstrual periods -nail discoloration or damage -red or watery eyes -red colored urine -stomach  upset This list may not describe all possible side effects. Call your doctor for medical advice about side effects. You may report side effects to FDA at 1-800-FDA-1088. Where should I keep my medicine? This drug is given in a hospital or clinic and will not be stored at home. NOTE: This sheet is a summary. It may not cover all possible information. If you have questions about this medicine, talk to your doctor, pharmacist, or health care provider.  2015, [employer] Standard. (2013-02-09 09:54:34) Vincristine injection What is this medicine? VINCRISTINE (vin KRIS teen) is a chemotherapy drug. It slows the growth of cancer cells. This medicine is used to treat many types of cancer like Hodgkin's disease, leukemia, non-Hodgkin's lymphoma, neuroblastoma (brain cancer), rhabdomyosarcoma, and Wilms' tumor. This medicine may be used for other purposes; ask your health care provider or pharmacist if you have questions. COMMON BRAND NAME(S): Oncovin, Vincasar PFS What should I tell my health care provider before I take this medicine? They need to know if you have any of these conditions: -blood disorders -gout -infection (especially chickenpox, cold sores, or herpes) -kidney disease -liver disease -lung disease -nervous system disease like Charcot-Marie-Tooth (CMT) -recent or ongoing radiation therapy -an unusual or allergic reaction to vincristine, other chemotherapy agents, other medicines, foods, dyes, or preservatives -pregnant or trying to get pregnant -breast-feeding How should I use this medicine? This drug is given as an infusion into a vein. It is administered in a hospital or clinic by a specially trained health care professional. If you have pain, swelling, burning, or any unusual feeling around the site of your injection, tell your health care professional right away. Talk to your pediatrician regarding the use of this medicine in children. While this drug may be prescribed for  selected conditions, precautions do apply. Overdosage: If you think you have taken too much of this medicine contact a poison control center or emergency room at once. NOTE: This medicine is only for you. Do not share this medicine with others. What if I miss a dose? It is important not to miss your dose. Call your doctor or health care professional if you are unable to keep an appointment. What may interact with this medicine? Do not take this medicine with any of the following medications: -itraconazole -mibefradil -voriconazole This medicine may also interact with the following medications: -cyclosporine -erythromycin -fluconazole -ketoconazole -medicines for HIV like delavirdine, efavirenz, nevirapine -medicines for seizures like ethotoin, fosphenotoin, phenytoin -medicines to increase blood counts like filgrastim, pegfilgrastim, sargramostim -other chemotherapy drugs like cisplatin, L-asparaginase, methotrexate, mitomycin, paclitaxel -pegaspargase -vaccines -zalcitabine, ddC Talk to your doctor or health care professional before taking any of these medicines: -acetaminophen -aspirin -ibuprofen -ketoprofen -naproxen This list may not describe all possible interactions. Give your health care provider a list of all the medicines, herbs, non-prescription drugs, or dietary supplements you use. Also tell them if you smoke, drink alcohol, or use illegal drugs. Some items may interact with your medicine. What should I watch for while using this medicine? Your condition will be monitored carefully while you are receiving this medicine. You will need important blood work done while you are taking this medicine. This drug may make you feel generally unwell. This is not uncommon, as chemotherapy can affect healthy cells as well as cancer cells. Report any side effects. Continue your course of treatment even though you feel ill unless your doctor tells you to stop. In  some cases, you may be  given additional medicines to help with side effects. Follow all directions for their use. Call your doctor or health care professional for advice if you get a fever, chills or sore throat, or other symptoms of a cold or flu. Do not treat yourself. Avoid taking products that contain aspirin, acetaminophen, ibuprofen, naproxen, or ketoprofen unless instructed by your doctor. These medicines may hide a fever. Do not become pregnant while taking this medicine. Women should inform their doctor if they wish to become pregnant or think they might be pregnant. There is a potential for serious side effects to an unborn child. Talk to your health care professional or pharmacist for more information. Do not breast-feed an infant while taking this medicine. Men may have a lower sperm count while taking this medicine. Talk to your doctor if you plan to father a child. What side effects may I notice from receiving this medicine? Side effects that you should report to your doctor or health care professional as soon as possible: -allergic reactions like skin rash, itching or hives, swelling of the face, lips, or tongue -breathing problems -confusion or changes in emotions or moods -constipation -cough -mouth sores -muscle weakness -nausea and vomiting -pain, swelling, redness or irritation at the injection site -pain, tingling, numbness in the hands or feet -problems with balance, talking, walking -seizures -stomach pain -trouble passing urine or change in the amount of urine Side effects that usually do not require medical attention (report to your doctor or health care professional if they continue or are bothersome): -diarrhea -hair loss -jaw pain -loss of appetite This list may not describe all possible side effects. Call your doctor for medical advice about side effects. You may report side effects to FDA at 1-800-FDA-1088. Where should I keep my medicine? This drug is given in a hospital or clinic  and will not be stored at home. NOTE: This sheet is a summary. It may not cover all possible information. If you have questions about this medicine, talk to your doctor, pharmacist, or health care provider.  2015, [employer]/Gold Standard. (2008-07-11 17:17:13) Rituximab injection What is this medicine? RITUXIMAB (ri TUX i mab) is a monoclonal antibody. This medicine changes the way the body's immune system works. It is used commonly to treat non-Hodgkin's lymphoma and other conditions. In cancer cells, this drug targets a specific protein within cancer cells and stops the cancer cells from growing. It is also used to treat rhuematoid arthritis (RA). In RA, this medicine slow the inflammatory process and help reduce joint pain and swelling. This medicine is often used with other cancer or arthritis medications. This medicine may be used for other purposes; ask your health care provider or pharmacist if you have questions. COMMON BRAND NAME(S): Rituxan What should I tell my health care provider before I take this medicine? They need to know if you have any of these conditions: -blood disorders -heart disease -history of hepatitis B -infection (especially a virus infection such as chickenpox, cold sores, or herpes) -irregular heartbeat -kidney disease -lung or breathing disease, like asthma -lupus -an unusual or allergic reaction to rituximab, mouse proteins, other medicines, foods, dyes, or preservatives -pregnant or trying to get pregnant -breast-feeding How should I use this medicine? This medicine is for infusion into a vein. It is administered in a hospital or clinic by a specially trained health care professional. A special MedGuide will be given to you by the pharmacist with each prescription and refill. Be sure to  read this information carefully each time. Talk to your pediatrician regarding the use of this medicine in children. This medicine is not approved for use in  children. Overdosage: If you think you have taken too much of this medicine contact a poison control center or emergency room at once. NOTE: This medicine is only for you. Do not share this medicine with others. What if I miss a dose? It is important not to miss a dose. Call your doctor or health care professional if you are unable to keep an appointment. What may interact with this medicine? -cisplatin -medicines for blood pressure -some other medicines for arthritis -vaccines This list may not describe all possible interactions. Give your health care provider a list of all the medicines, herbs, non-prescription drugs, or dietary supplements you use. Also tell them if you smoke, drink alcohol, or use illegal drugs. Some items may interact with your medicine. What should I watch for while using this medicine? Report any side effects that you notice during your treatment right away, such as changes in your breathing, fever, chills, dizziness or lightheadedness. These effects are more common with the first dose. Visit your prescriber or health care professional for checks on your progress. You will need to have regular blood work. Report any other side effects. The side effects of this medicine can continue after you finish your treatment. Continue your course of treatment even though you feel ill unless your doctor tells you to stop. Call your doctor or health care professional for advice if you get a fever, chills or sore throat, or other symptoms of a cold or flu. Do not treat yourself. This drug decreases your body's ability to fight infections. Try to avoid being around people who are sick. This medicine may increase your risk to bruise or bleed. Call your doctor or health care professional if you notice any unusual bleeding. Be careful brushing and flossing your teeth or using a toothpick because you may get an infection or bleed more easily. If you have any dental work done, tell your dentist  you are receiving this medicine. Avoid taking products that contain aspirin, acetaminophen, ibuprofen, naproxen, or ketoprofen unless instructed by your doctor. These medicines may hide a fever. Do not become pregnant while taking this medicine. Women should inform their doctor if they wish to become pregnant or think they might be pregnant. There is a potential for serious side effects to an unborn child. Talk to your health care professional or pharmacist for more information. Do not breast-feed an infant while taking this medicine. What side effects may I notice from receiving this medicine? Side effects that you should report to your doctor or health care professional as soon as possible: -allergic reactions like skin rash, itching or hives, swelling of the face, lips, or tongue -low blood counts - this medicine may decrease the number of white blood cells, red blood cells and platelets. You may be at increased risk for infections and bleeding. -signs of infection - fever or chills, cough, sore throat, pain or difficulty passing urine -signs of decreased platelets or bleeding - bruising, pinpoint red spots on the skin, black, tarry stools, blood in the urine -signs of decreased red blood cells - unusually weak or tired, fainting spells, lightheadedness -breathing problems -confused, not responsive -chest pain -fast, irregular heartbeat -feeling faint or lightheaded, falls -mouth sores -redness, blistering, peeling or loosening of the skin, including inside the mouth -stomach pain -swelling of the ankles, feet, or hands -trouble passing  urine or change in the amount of urine Side effects that usually do not require medical attention (report to your doctor or other health care professional if they continue or are bothersome): -anxiety -headache -loss of appetite -muscle aches -nausea -night sweats This list may not describe all possible side effects. Call your doctor for medical advice  about side effects. You may report side effects to FDA at 1-800-FDA-1088. Where should I keep my medicine? This drug is given in a hospital or clinic and will not be stored at home. NOTE: This sheet is a summary. It may not cover all possible information. If you have questions about this medicine, talk to your doctor, pharmacist, or health care provider.  2015, [employer]/Gold Standard. (2008-06-13 14:04:59) Cyclophosphamide injection What is this medicine? CYCLOPHOSPHAMIDE (sye kloe FOSS fa mide) is a chemotherapy drug. It slows the growth of cancer cells. This medicine is used to treat many types of cancer like lymphoma, myeloma, leukemia, breast cancer, and ovarian cancer, to name a few. This medicine may be used for other purposes; ask your health care provider or pharmacist if you have questions. COMMON BRAND NAME(S): Cytoxan, Neosar What should I tell my health care provider before I take this medicine? They need to know if you have any of these conditions: -blood disorders -history of other chemotherapy -infection -kidney disease -liver disease -recent or ongoing radiation therapy -tumors in the bone marrow -an unusual or allergic reaction to cyclophosphamide, other chemotherapy, other medicines, foods, dyes, or preservatives -pregnant or trying to get pregnant -breast-feeding How should I use this medicine? This drug is usually given as an injection into a vein or muscle or by infusion into a vein. It is administered in a hospital or clinic by a specially trained health care professional. Talk to your pediatrician regarding the use of this medicine in children. Special care may be needed. Overdosage: If you think you have taken too much of this medicine contact a poison control center or emergency room at once. NOTE: This medicine is only for you. Do not share this medicine with others. What if I miss a dose? It is important not to miss your dose. Call your doctor or health care  professional if you are unable to keep an appointment. What may interact with this medicine? This medicine may interact with the following medications: -amiodarone -amphotericin B -azathioprine -certain antiviral medicines for HIV or AIDS such as protease inhibitors (e.g., indinavir, ritonavir) and zidovudine -certain blood pressure medications such as benazepril, captopril, enalapril, fosinopril, lisinopril, moexipril, monopril, perindopril, quinapril, ramipril, trandolapril -certain cancer medications such as anthracyclines (e.g., daunorubicin, doxorubicin), busulfan, cytarabine, paclitaxel, pentostatin, tamoxifen, trastuzumab -certain diuretics such as chlorothiazide, chlorthalidone, hydrochlorothiazide, indapamide, metolazone -certain medicines that treat or prevent blood clots like warfarin -certain muscle relaxants such as succinylcholine -cyclosporine -etanercept -indomethacin -medicines to increase blood counts like filgrastim, pegfilgrastim, sargramostim -medicines used as general anesthesia -metronidazole -natalizumab This list may not describe all possible interactions. Give your health care provider a list of all the medicines, herbs, non-prescription drugs, or dietary supplements you use. Also tell them if you smoke, drink alcohol, or use illegal drugs. Some items may interact with your medicine. What should I watch for while using this medicine? Visit your doctor for checks on your progress. This drug may make you feel generally unwell. This is not uncommon, as chemotherapy can affect healthy cells as well as cancer cells. Report any side effects. Continue your course of treatment even though you feel ill unless your doctor  tells you to stop. Drink water or other fluids as directed. Urinate often, even at night. In some cases, you may be given additional medicines to help with side effects. Follow all directions for their use. Call your doctor or health care professional for  advice if you get a fever, chills or sore throat, or other symptoms of a cold or flu. Do not treat yourself. This drug decreases your body's ability to fight infections. Try to avoid being around people who are sick. This medicine may increase your risk to bruise or bleed. Call your doctor or health care professional if you notice any unusual bleeding. Be careful brushing and flossing your teeth or using a toothpick because you may get an infection or bleed more easily. If you have any dental work done, tell your dentist you are receiving this medicine. You may get drowsy or dizzy. Do not drive, use machinery, or do anything that needs mental alertness until you know how this medicine affects you. Do not become pregnant while taking this medicine or for 1 year after stopping it. Women should inform their doctor if they wish to become pregnant or think they might be pregnant. Men should not father a child while taking this medicine and for 4 months after stopping it. There is a potential for serious side effects to an unborn child. Talk to your health care professional or pharmacist for more information. Do not breast-feed an infant while taking this medicine. This medicine may interfere with the ability to have a child. This medicine has caused ovarian failure in some women. This medicine has caused reduced sperm counts in some men. You should talk with your doctor or health care professional if you are concerned about your fertility. If you are going to have surgery, tell your doctor or health care professional that you have taken this medicine. What side effects may I notice from receiving this medicine? Side effects that you should report to your doctor or health care professional as soon as possible: -allergic reactions like skin rash, itching or hives, swelling of the face, lips, or tongue -low blood counts - this medicine may decrease the number of white blood cells, red blood cells and platelets.  You may be at increased risk for infections and bleeding. -signs of infection - fever or chills, cough, sore throat, pain or difficulty passing urine -signs of decreased platelets or bleeding - bruising, pinpoint red spots on the skin, black, tarry stools, blood in the urine -signs of decreased red blood cells - unusually weak or tired, fainting spells, lightheadedness -breathing problems -dark urine -dizziness -palpitations -swelling of the ankles, feet, hands -trouble passing urine or change in the amount of urine -weight gain -yellowing of the eyes or skin Side effects that usually do not require medical attention (report to your doctor or health care professional if they continue or are bothersome): -changes in nail or skin color -hair loss -missed menstrual periods -mouth sores -nausea, vomiting This list may not describe all possible side effects. Call your doctor for medical advice about side effects. You may report side effects to FDA at 1-800-FDA-1088. Where should I keep my medicine? This drug is given in a hospital or clinic and will not be stored at home. NOTE: This sheet is a summary. It may not cover all possible information. If you have questions about this medicine, talk to your doctor, pharmacist, or health care provider.  2015, [employer]vier/Gold Standard. (2012-08-28 16:22:58)

## 2015-01-11 NOTE — Patient Instructions (Signed)

## 2015-01-11 NOTE — Telephone Encounter (Signed)
patient will get new sched in treatment

## 2015-01-11 NOTE — Progress Notes (Signed)
Nutrition follow up completed with patient in chemotherapy for B Cell Lymphoma. Patient complains of Nausea.   She has taste alterations but continues to force herself to eat. Complains of increased urinary urgency. Weight stable at 153.3 pounds.  Nutrition diagnosis:  Food and Nutrition Related Knowledge Deficit resolved.  Patient will continue to consume adequate calories and protein to promote weight maintenance. Continue oral nutrition supplements as needed.  Patient will contact me for questions or concerns.

## 2015-01-11 NOTE — Telephone Encounter (Signed)
gv and printed appt sched and avs for pt for April  °

## 2015-01-12 ENCOUNTER — Ambulatory Visit (HOSPITAL_BASED_OUTPATIENT_CLINIC_OR_DEPARTMENT_OTHER): Payer: BLUE CROSS/BLUE SHIELD

## 2015-01-12 DIAGNOSIS — Z5189 Encounter for other specified aftercare: Secondary | ICD-10-CM

## 2015-01-12 DIAGNOSIS — C8330 Diffuse large B-cell lymphoma, unspecified site: Secondary | ICD-10-CM

## 2015-01-12 DIAGNOSIS — C8335 Diffuse large B-cell lymphoma, lymph nodes of inguinal region and lower limb: Secondary | ICD-10-CM

## 2015-01-12 MED ORDER — PEGFILGRASTIM INJECTION 6 MG/0.6ML ~~LOC~~
6.0000 mg | PREFILLED_SYRINGE | Freq: Once | SUBCUTANEOUS | Status: AC
  Administered 2015-01-12: 6 mg via SUBCUTANEOUS
  Filled 2015-01-12: qty 0.6

## 2015-01-12 NOTE — Progress Notes (Signed)
[place] OFFICE PROGRESS NOTE  Patient Care Team: Maurice Small, MD as PCP - General (Family Medicine)  SUMMARY OF ONCOLOGIC HISTORY: Oncology History   Diffuse large B cell lymphoma   Staging form: Lymphoid Neoplasms, AJCC 6th Edition     Clinical stage from 12/01/2014: Stage I - Signed by Heath Lark, MD on 12/01/2014        Diffuse large B cell lymphoma   09/08/2014 Imaging Ultrasound pelvis detected abnormal soft tissue mass in the left groin   09/13/2014 Imaging Ultrasound abdomen showed up to 7 cm mass-like area of mild hypoechogenicity in the left hepatic lobe   09/21/2014 Imaging MRI of the abdomen showed 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   11/23/2014 Bone Marrow Biopsy Accession: [id number] is negative for lymphoma   11/30/2014 - 01/11/2015 Chemotherapy She received chemotherapy with RCHOP X 3 cycles   11/30/2014 Adverse Reaction Treatment was complicated by mild hypersensitivity reaction to rituximab    INTERVAL HISTORY: Please see below for problem oriented charting. She is seen prior to cycle 3 of treatment. She tolerated treatment well. She has total alopecia.  REVIEW OF SYSTEMS:   Constitutional: Denies fevers, chills or abnormal weight loss Eyes: Denies blurriness of vision Ears, nose, mouth, throat, and face: Denies mucositis or sore throat Respiratory: Denies cough, dyspnea or wheezes Cardiovascular: Denies palpitation, chest discomfort or lower extremity swelling Gastrointestinal:  Denies nausea, heartburn or change in bowel habits Skin: Denies abnormal skin rashes Lymphatics: Denies new lymphadenopathy or easy  bruising Neurological:Denies numbness, tingling or new weaknesses Behavioral/Psych: Mood is stable, no new changes  All other systems were reviewed with the patient and are negative.  I have reviewed the past medical history, past surgical history, social history and family history with the patient and they are unchanged from previous note.  ALLERGIES:  is allergic to adhesive; influenza vaccines; prednisone; and sulfa antibiotics.  MEDICATIONS:  Current Outpatient Prescriptions  Medication Sig Dispense Refill  . ibuprofen (ADVIL) 200 MG tablet Take 200 mg by mouth every 6 (six) hours as needed.    . Multiple Vitamins-Minerals (MULTIVITAMIN PO) Take by mouth daily.    Marland Kitchen oxyCODONE-acetaminophen (ROXICET) 5-325 MG per tablet Take 1 tablet by mouth every 4 (four) hours as needed for severe pain. 60 tablet 0  . predniSONE (DELTASONE) 20 MG tablet Take 2 tablets (40 mg total) by mouth daily. Take on days 2-5 of chemotherapy. 48 tablet 0  . UNABLE TO FIND Take 1 tablet by mouth at bedtime. Med Name: Supplement "DGL"    . allopurinol (ZYLOPRIM) 300 MG tablet Take 1 tablet (300 mg total) by mouth daily. (Patient not taking: Reported on 01/11/2015) 30 tablet 0  . Glucosamine-Chondroit-Vit C-Mn (GLUCOSAMINE CHONDR 1500 COMPLX PO) Take 1 tablet by mouth daily.     Marland Kitchen HYDROcodone-acetaminophen (NORCO) 7.5-325 MG per tablet Take 7.5 tablets by mouth.  0  . ondansetron (ZOFRAN) 8 MG tablet Take 1 tablet (8 mg total) by mouth every 8 (eight) hours as needed. (Patient not taking: Reported on 01/11/2015) 30 tablet 1  . prochlorperazine (COMPAZINE) 10 MG tablet Take 1 tablet (10 mg total) by mouth every 6 (six) hours as needed (Nausea or vomiting). (Patient not taking: Reported  on 01/11/2015) 30 tablet 6   No current facility-administered medications for this visit.    PHYSICAL EXAMINATION: ECOG PERFORMANCE STATUS: 0 - Asymptomatic  Filed Vitals:   01/11/15 0858  BP: 111/60  Pulse: 64  Temp: 98.3 F (36.8  C)  Resp: 18   Filed Weights   01/11/15 0858  Weight: 153 lb 4.8 oz (69.536 kg)    GENERAL:alert, no distress and comfortable SKIN: skin color, texture, turgor are normal, no rashes or significant lesions EYES: normal, Conjunctiva are pink and non-injected, sclera clear OROPHARYNX:no exudate, no erythema and lips, buccal mucosa, and tongue normal  NECK: supple, thyroid normal size, non-tender, without nodularity LYMPH:  no palpable lymphadenopathy in the cervical, axillary or inguinal LUNGS: clear to auscultation and percussion with normal breathing effort HEART: regular rate & rhythm and no murmurs and no lower extremity edema ABDOMEN:abdomen soft, non-tender and normal bowel sounds Musculoskeletal:no cyanosis of digits and no clubbing  NEURO: alert & oriented x 3 with fluent speech, no focal motor/sensory deficits  LABORATORY DATA:  I have reviewed the data as listed    Component Value Date/Time   NA 143 01/11/2015 0817   NA 141 11/16/2014 0949   K 3.8 01/11/2015 0817   K 4.5 11/16/2014 0949   CL 103 11/16/2014 0949   CO2 25 01/11/2015 0817   CO2 27 11/16/2014 0949   GLUCOSE 133 01/11/2015 0817   GLUCOSE 101* 11/16/2014 0949   BUN 14.9 01/11/2015 0817   BUN 12 11/16/2014 0949   CREATININE 0.8 01/11/2015 0817   CREATININE 0.87 11/16/2014 0949   CALCIUM 9.2 01/11/2015 0817   CALCIUM 10.0 11/16/2014 0949   PROT 6.5 01/11/2015 0817   PROT 6.8 11/16/2014 0949   ALBUMIN 3.7 01/11/2015 0817   ALBUMIN 4.0 11/16/2014 0949   AST 19 01/11/2015 0817   AST 23 11/16/2014 0949   ALT 33 01/11/2015 0817   ALT 23 11/16/2014 0949   ALKPHOS 61 01/11/2015 0817   ALKPHOS 40 11/16/2014 0949   BILITOT 0.36 01/11/2015 0817   BILITOT 0.6 11/16/2014 0949   GFRNONAA 76* 11/16/2014 0949   GFRAA 88* 11/16/2014 0949    No results found for: SPEP, UPEP  Lab Results  Component Value Date   WBC 5.7 01/11/2015   NEUTROABS 4.2 01/11/2015   HGB 12.3 01/11/2015   HCT 37.6 01/11/2015   MCV  97.5 01/11/2015   PLT 312 01/11/2015      Chemistry      Component Value Date/Time   NA 143 01/11/2015 0817   NA 141 11/16/2014 0949   K 3.8 01/11/2015 0817   K 4.5 11/16/2014 0949   CL 103 11/16/2014 0949   CO2 25 01/11/2015 0817   CO2 27 11/16/2014 0949   BUN 14.9 01/11/2015 0817   BUN 12 11/16/2014 0949   CREATININE 0.8 01/11/2015 0817   CREATININE 0.87 11/16/2014 0949      Component Value Date/Time   CALCIUM 9.2 01/11/2015 0817   CALCIUM 10.0 11/16/2014 0949   ALKPHOS 61 01/11/2015 0817   ALKPHOS 40 11/16/2014 0949   AST 19 01/11/2015 0817   AST 23 11/16/2014 0949   ALT 33 01/11/2015 0817   ALT 23 11/16/2014 0949   BILITOT 0.36 01/11/2015 0817   BILITOT 0.6 11/16/2014 0949     ASSESSMENT & PLAN:  Diffuse large B cell lymphoma She tolerated treatment well. I will continue same treatment without dose adjustment. I plan to repeat PET/CT scan before she returns next month to review test  results and we'll present her case at hematology tumor board. If she have excellent response to treatment, we can refer her for consolidation radiation treatment.      Orders Placed This Encounter  Procedures  . NM PET Image Restag (PS) Skull Base To Thigh    Standing Status: Future     Number of Occurrences:      Standing Expiration Date: 03/12/2016    Order Specific Question:  Reason for Exam (SYMPTOM  OR DIAGNOSIS REQUIRED)    Answer:  staging lymphoma    Order Specific Question:  Is the patient pregnant?    Answer:  No    Order Specific Question:  Preferred imaging location?    Answer:  [facility]   All questions were answered. The patient knows to call the clinic with any problems, questions or concerns. No barriers to learning was detected. I spent 25 minutes counseling the patient face to face. The total time spent in the appointment was 30 minutes and more than 50% was on counseling and review of test results     [facility], Carlen Fils, MD 01/12/2015 11:25 AM

## 2015-01-12 NOTE — Assessment & Plan Note (Signed)
She tolerated treatment well. I will continue same treatment without dose adjustment. I plan to repeat PET/CT scan before she returns next month to review test results and we'll present her case at hematology tumor board. If she have excellent response to treatment, we can refer her for consolidation radiation treatment.

## 2015-01-18 ENCOUNTER — Telehealth: Payer: Self-pay | Admitting: *Deleted

## 2015-01-18 NOTE — Telephone Encounter (Signed)
Patient called with multiple questions: 1.  When should she use neutropenic precautions?  Advised days 7-10 for most strenuous precautions.  Since Easter is day 10 for her, advised her to use good handwashing, avoid sick people and use a mask in groups.  She agreed to do so. 2. Acne-form rash on her back.  Advised this was probably related to steroids associated with treatment.  Encouraged her to call if the rash looks red or angry and to watch temperature, call if greater than 100.5 degrees. 3.  May she request a particular radiation oncologist.  Dr. Isidore Moos treated her husband and she is interested in having Dr. Isidore Moos for her Rad. Onc.  Advised her that she should discuss that with Dr. Alvy Bimler at 01/31/15 appointment.

## 2015-01-23 ENCOUNTER — Telehealth: Payer: Self-pay | Admitting: Hematology and Oncology

## 2015-01-23 ENCOUNTER — Ambulatory Visit (HOSPITAL_BASED_OUTPATIENT_CLINIC_OR_DEPARTMENT_OTHER): Payer: BLUE CROSS/BLUE SHIELD | Admitting: Hematology and Oncology

## 2015-01-23 ENCOUNTER — Other Ambulatory Visit: Payer: Self-pay | Admitting: *Deleted

## 2015-01-23 ENCOUNTER — Encounter: Payer: Self-pay | Admitting: Hematology and Oncology

## 2015-01-23 VITALS — BP 120/69 | HR 71 | Temp 98.3°F | Resp 19 | Ht 63.0 in | Wt 154.5 lb

## 2015-01-23 DIAGNOSIS — C8335 Diffuse large B-cell lymphoma, lymph nodes of inguinal region and lower limb: Secondary | ICD-10-CM | POA: Diagnosis not present

## 2015-01-23 DIAGNOSIS — L089 Local infection of the skin and subcutaneous tissue, unspecified: Secondary | ICD-10-CM | POA: Diagnosis not present

## 2015-01-23 DIAGNOSIS — R12 Heartburn: Secondary | ICD-10-CM | POA: Diagnosis not present

## 2015-01-23 DIAGNOSIS — C8330 Diffuse large B-cell lymphoma, unspecified site: Secondary | ICD-10-CM

## 2015-01-23 HISTORY — DX: Local infection of the skin and subcutaneous tissue, unspecified: L08.9

## 2015-01-23 MED ORDER — TRAMADOL HCL 50 MG PO TABS: 50.0000 mg | ORAL_TABLET | Freq: Four times a day (QID) | ORAL | Status: DC | PRN

## 2015-01-23 MED ORDER — CEPHALEXIN 500 MG PO CAPS: 500.0000 mg | ORAL_CAPSULE | Freq: Three times a day (TID) | ORAL | Status: DC

## 2015-01-23 NOTE — Assessment & Plan Note (Signed)
She complained of symptoms of heartburn. I recommend a trial of Maalox.

## 2015-01-23 NOTE — Telephone Encounter (Signed)
D/T per MD pt sick add on.... KJ

## 2015-01-23 NOTE — Assessment & Plan Note (Signed)
She tolerated treatment well.   I plan to see her next week to review test results.

## 2015-01-23 NOTE — Progress Notes (Signed)
[facility] OFFICE PROGRESS NOTE  Patient Care Team: Maurice Small, MD as PCP - General (Family Medicine)  SUMMARY OF ONCOLOGIC HISTORY: Oncology History   Diffuse large B cell lymphoma   Staging form: Lymphoid Neoplasms, AJCC 6th Edition     Clinical stage from 12/01/2014: Stage I - Signed by Heath Lark, MD on 12/01/2014        Diffuse large B cell lymphoma   09/08/2014 Imaging Ultrasound pelvis detected abnormal soft tissue mass in the left groin   09/13/2014 Imaging Ultrasound abdomen showed up to 7 cm mass-like area of mild hypoechogenicity in the left hepatic lobe   09/21/2014 Imaging MRI of the abdomen showed large cavernous hemangioma in the liver and bilateral adrenal nodules   10/24/2014 Procedure She underwent ultrasound-guided biopsy of the left groin mass   10/24/2014 Pathology Results Accession: [id number] confirm diagnosis of diffuse large B-cell lymphoma   11/15/2014 Imaging Echocardiogram showed normal ejection fraction   11/17/2014 Procedure She has port placement.   11/23/2014 Bone Marrow Biopsy Accession: [id number] is negative for lymphoma   11/30/2014 - 01/11/2015 Chemotherapy She received chemotherapy with RCHOP X 3 cycles   11/30/2014 Adverse Reaction Treatment was complicated by mild hypersensitivity reaction to rituximab    INTERVAL HISTORY: Please see below for problem oriented charting. She called to be evaluated urgently because of an infected cyst on the back. She rated the pain as 10 out of 10 pain. This started over a week ago. She had low-grade fever of 99 Fahrenheit at home.  she also complained of symptoms of heartburn /esophagitis  denies any chills. REVIEW OF SYSTEMS:   Eyes: Denies blurriness of vision Ears, nose, mouth, throat, and face: Denies mucositis or sore throat Respiratory: Denies cough, dyspnea or wheezes Cardiovascular: Denies palpitation, chest discomfort or lower extremity swelling Gastrointestinal:  Denies nausea, or change in  bowel habits Lymphatics: Denies new lymphadenopathy or easy bruising Neurological:Denies numbness, tingling or new weaknesses Behavioral/Psych: Mood is stable, no new changes  All other systems were reviewed with the patient and are negative.  I have reviewed the past medical history, past surgical history, social history and family history with the patient and they are unchanged from previous note.  ALLERGIES:  is allergic to adhesive; influenza vaccines; prednisone; and sulfa antibiotics.  MEDICATIONS:  Current Outpatient Prescriptions  Medication Sig Dispense Refill  . Glucosamine-Chondroit-Vit C-Mn (GLUCOSAMINE CHONDR 1500 COMPLX PO) Take 1 tablet by mouth daily.     Marland Kitchen HYDROcodone-acetaminophen (NORCO) 7.5-325 MG per tablet Take 7.5 tablets by mouth.  0  . ibuprofen (ADVIL) 200 MG tablet Take 200 mg by mouth every 6 (six) hours as needed.    . Multiple Vitamins-Minerals (MULTIVITAMIN PO) Take by mouth daily.    . ondansetron (ZOFRAN) 8 MG tablet Take 1 tablet (8 mg total) by mouth every 8 (eight) hours as needed. 30 tablet 1  . prochlorperazine (COMPAZINE) 10 MG tablet Take 1 tablet (10 mg total) by mouth every 6 (six) hours as needed (Nausea or vomiting). 30 tablet 6  . cephALEXin (KEFLEX) 500 MG capsule Take 1 capsule (500 mg total) by mouth 3 (three) times daily. 21 capsule 0  . traMADol (ULTRAM) 50 MG tablet Take 1 tablet (50 mg total) by mouth every 6 (six) hours as needed for moderate pain. 60 tablet 0  . UNABLE TO FIND Take 1 tablet by mouth at bedtime. Med Name: Supplement "DGL"     No current facility-administered medications for this visit.    PHYSICAL  EXAMINATION: ECOG PERFORMANCE STATUS: 0 - Asymptomatic  Filed Vitals:   01/23/15 0907  BP: 120/69  Pulse: 71  Temp: 98.3 F (36.8 C)  Resp: 19   Filed Weights   01/23/15 0907  Weight: 154 lb 8 oz (70.081 kg)    GENERAL:alert, no distress and comfortable SKIN:  Noted an infected sebaceous cyst between the scapula  on her back. There is no surrounding skin cellulitis EYES: normal, Conjunctiva are pink and non-injected, sclera clear OROPHARYNX:no exudate, no erythema and lips, buccal mucosa, and tongue normal  Musculoskeletal:no cyanosis of digits and no clubbing  NEURO: alert & oriented x 3 with fluent speech, no focal motor/sensory deficits  LABORATORY DATA:  I have reviewed the data as listed    Component Value Date/Time   NA 143 01/11/2015 0817   NA 141 11/16/2014 0949   K 3.8 01/11/2015 0817   K 4.5 11/16/2014 0949   CL 103 11/16/2014 0949   CO2 25 01/11/2015 0817   CO2 27 11/16/2014 0949   GLUCOSE 133 01/11/2015 0817   GLUCOSE 101* 11/16/2014 0949   BUN 14.9 01/11/2015 0817   BUN 12 11/16/2014 0949   CREATININE 0.8 01/11/2015 0817   CREATININE 0.87 11/16/2014 0949   CALCIUM 9.2 01/11/2015 0817   CALCIUM 10.0 11/16/2014 0949   PROT 6.5 01/11/2015 0817   PROT 6.8 11/16/2014 0949   ALBUMIN 3.7 01/11/2015 0817   ALBUMIN 4.0 11/16/2014 0949   AST 19 01/11/2015 0817   AST 23 11/16/2014 0949   ALT 33 01/11/2015 0817   ALT 23 11/16/2014 0949   ALKPHOS 61 01/11/2015 0817   ALKPHOS 40 11/16/2014 0949   BILITOT 0.36 01/11/2015 0817   BILITOT 0.6 11/16/2014 0949   GFRNONAA 76* 11/16/2014 0949   GFRAA 88* 11/16/2014 0949    No results found for: SPEP, UPEP  Lab Results  Component Value Date   WBC 5.7 01/11/2015   NEUTROABS 4.2 01/11/2015   HGB 12.3 01/11/2015   HCT 37.6 01/11/2015   MCV 97.5 01/11/2015   PLT 312 01/11/2015      Chemistry      Component Value Date/Time   NA 143 01/11/2015 0817   NA 141 11/16/2014 0949   K 3.8 01/11/2015 0817   K 4.5 11/16/2014 0949   CL 103 11/16/2014 0949   CO2 25 01/11/2015 0817   CO2 27 11/16/2014 0949   BUN 14.9 01/11/2015 0817   BUN 12 11/16/2014 0949   CREATININE 0.8 01/11/2015 0817   CREATININE 0.87 11/16/2014 0949      Component Value Date/Time   CALCIUM 9.2 01/11/2015 0817   CALCIUM 10.0 11/16/2014 0949   ALKPHOS 61  01/11/2015 0817   ALKPHOS 40 11/16/2014 0949   AST 19 01/11/2015 0817   AST 23 11/16/2014 0949   ALT 33 01/11/2015 0817   ALT 23 11/16/2014 0949   BILITOT 0.36 01/11/2015 0817   BILITOT 0.6 11/16/2014 0949     ASSESSMENT & PLAN:  Diffuse large B cell lymphoma She tolerated treatment well.   I plan to see her next week to review test results.    Skin infection  She has what appears to be  An infected sebaceous cyst. I proceed to prescribe one week antibiotic treatment and recommend she monitored closely. I gave her a prescription tramadol for pain.   Heartburn  She complained of symptoms of heartburn. I recommend a trial of Maalox.       All questions were answered. The patient knows to call  the clinic with any problems, questions or concerns. No barriers to learning was detected. I spent 15 minutes counseling the patient face to face. The total time spent in the appointment was 20 minutes and more than 50% was on counseling and review of test results     [facility], [place], MD 01/23/2015 11:23 AM

## 2015-01-23 NOTE — Assessment & Plan Note (Signed)
She has what appears to be  An infected sebaceous cyst. I proceed to prescribe one week antibiotic treatment and recommend she monitored closely. I gave her a prescription tramadol for pain.

## 2015-01-27 ENCOUNTER — Telehealth: Payer: Self-pay

## 2015-01-27 ENCOUNTER — Ambulatory Visit (HOSPITAL_BASED_OUTPATIENT_CLINIC_OR_DEPARTMENT_OTHER): Payer: BLUE CROSS/BLUE SHIELD | Admitting: Hematology and Oncology

## 2015-01-27 VITALS — BP 124/74 | HR 72 | Temp 97.9°F | Resp 18 | Ht 63.0 in | Wt 152.6 lb

## 2015-01-27 DIAGNOSIS — C8335 Diffuse large B-cell lymphoma, lymph nodes of inguinal region and lower limb: Secondary | ICD-10-CM

## 2015-01-27 DIAGNOSIS — L089 Local infection of the skin and subcutaneous tissue, unspecified: Secondary | ICD-10-CM | POA: Diagnosis not present

## 2015-01-27 MED ORDER — OXYCODONE-ACETAMINOPHEN 5-325 MG PO TABS
2.0000 | ORAL_TABLET | Freq: Once | ORAL | Status: AC
  Administered 2015-01-27: 2 via ORAL

## 2015-01-27 MED ORDER — OXYCODONE-ACETAMINOPHEN 5-325 MG PO TABS
ORAL_TABLET | ORAL | Status: AC
  Filled 2015-01-27: qty 2

## 2015-01-27 NOTE — Telephone Encounter (Signed)
Instructed pt to come in now for office visit.  She agreed and will be on her way shortly.

## 2015-01-27 NOTE — Telephone Encounter (Signed)
Last week showed cyst on back. Given antibiotics. Has gotten worse. She wants to text a picture of it to see if it can wait for appt next Tuesday or needs to be seen right away. She is willing to drop in for a lobby visit.

## 2015-01-27 NOTE — Telephone Encounter (Signed)
Please add her on to my schedule this morning whenever she can come in

## 2015-01-29 NOTE — Assessment & Plan Note (Signed)
This is getting worse. I proceeded to excise the wound to drain the pus from the infected cyst. 3 cc of 1% lidocaine is used to numb the skin. The skin is swabbed with iodine. Approximately 5 cc of pus was drained from the infected cyst. She is given 2 percocet for pain and recommended to keep the skin dry and to finish her course of oral antibiotics

## 2015-01-29 NOTE — Progress Notes (Signed)
[facility] OFFICE PROGRESS NOTE  Patient Care Team: Maurice Small, MD as PCP - General (Family Medicine)  SUMMARY OF ONCOLOGIC HISTORY: Oncology History   Diffuse large B cell lymphoma   Staging form: Lymphoid Neoplasms, AJCC 6th Edition     Clinical stage from 12/01/2014: Stage I - Signed by Heath Lark, MD on 12/01/2014        Diffuse large B cell lymphoma   09/08/2014 Imaging Ultrasound pelvis detected abnormal soft tissue mass in the left groin   09/13/2014 Imaging Ultrasound abdomen showed up to 7 cm mass-like area of mild hypoechogenicity in the left hepatic lobe   09/21/2014 Imaging MRI of the abdomen showed large cavernous hemangioma in the liver and bilateral adrenal nodules   10/24/2014 Procedure She underwent ultrasound-guided biopsy of the left groin mass   10/24/2014 Pathology Results Accession: [id number] confirm diagnosis of diffuse large B-cell lymphoma   11/15/2014 Imaging Echocardiogram showed normal ejection fraction   11/17/2014 Procedure She has port placement.   11/23/2014 Bone Marrow Biopsy Accession: [id number] is negative for lymphoma   11/30/2014 - 01/11/2015 Chemotherapy She received chemotherapy with RCHOP X 3 cycles   11/30/2014 Adverse Reaction Treatment was complicated by mild hypersensitivity reaction to rituximab    INTERVAL HISTORY: Please see below for problem oriented charting. She is seen due to worsening skin infection  REVIEW OF SYSTEMS:   Constitutional: Denies fevers, chills or abnormal weight loss Eyes: Denies blurriness of vision Ears, nose, mouth, throat, and face: Denies mucositis or sore throat Respiratory: Denies cough, dyspnea or wheezes Cardiovascular: Denies palpitation, chest discomfort or lower extremity swelling Gastrointestinal:  Denies nausea, heartburn or change in bowel habits Skin: Denies abnormal skin rashes Lymphatics: Denies new lymphadenopathy or easy bruising Neurological:Denies numbness, tingling or new  weaknesses Behavioral/Psych: Mood is stable, no new changes  All other systems were reviewed with the patient and are negative.  I have reviewed the past medical history, past surgical history, social history and family history with the patient and they are unchanged from previous note.  ALLERGIES:  is allergic to adhesive; influenza vaccines; prednisone; and sulfa antibiotics.  MEDICATIONS:  Current Outpatient Prescriptions  Medication Sig Dispense Refill  . cephALEXin (KEFLEX) 500 MG capsule Take 1 capsule (500 mg total) by mouth 3 (three) times daily. 21 capsule 0  . Glucosamine-Chondroit-Vit C-Mn (GLUCOSAMINE CHONDR 1500 COMPLX PO) Take 1 tablet by mouth daily.     Marland Kitchen HYDROcodone-acetaminophen (NORCO) 7.5-325 MG per tablet Take 7.5 tablets by mouth.  0  . ibuprofen (ADVIL) 200 MG tablet Take 200 mg by mouth every 6 (six) hours as needed.    . Multiple Vitamins-Minerals (MULTIVITAMIN PO) Take by mouth daily.    . ondansetron (ZOFRAN) 8 MG tablet Take 1 tablet (8 mg total) by mouth every 8 (eight) hours as needed. 30 tablet 1  . prochlorperazine (COMPAZINE) 10 MG tablet Take 1 tablet (10 mg total) by mouth every 6 (six) hours as needed (Nausea or vomiting). 30 tablet 6  . traMADol (ULTRAM) 50 MG tablet Take 1 tablet (50 mg total) by mouth every 6 (six) hours as needed for moderate pain. 60 tablet 0  . UNABLE TO FIND Take 1 tablet by mouth at bedtime. Med Name: Supplement "DGL"     No current facility-administered medications for this visit.    PHYSICAL EXAMINATION: ECOG PERFORMANCE STATUS: 0 - Asymptomatic  Filed Vitals:   01/27/15 1040  BP: 124/74  Pulse: 72  Temp: 97.9 F (36.6 C)  Resp: 18  Filed Weights   01/27/15 1040  Weight: 152 lb 9.6 oz (69.219 kg)    GENERAL:alert, no distress and comfortable SKIN: she has a large area of induration and erythema over an infected sebaceous cyst. No surrounding skin cellulitis EYES: normal, Conjunctiva are pink and non-injected,  sclera clear NEURO: alert & oriented x 3 with fluent speech, no focal motor/sensory deficits  LABORATORY DATA:  I have reviewed the data as listed    Component Value Date/Time   NA 143 01/11/2015 0817   NA 141 11/16/2014 0949   K 3.8 01/11/2015 0817   K 4.5 11/16/2014 0949   CL 103 11/16/2014 0949   CO2 25 01/11/2015 0817   CO2 27 11/16/2014 0949   GLUCOSE 133 01/11/2015 0817   GLUCOSE 101* 11/16/2014 0949   BUN 14.9 01/11/2015 0817   BUN 12 11/16/2014 0949   CREATININE 0.8 01/11/2015 0817   CREATININE 0.87 11/16/2014 0949   CALCIUM 9.2 01/11/2015 0817   CALCIUM 10.0 11/16/2014 0949   PROT 6.5 01/11/2015 0817   PROT 6.8 11/16/2014 0949   ALBUMIN 3.7 01/11/2015 0817   ALBUMIN 4.0 11/16/2014 0949   AST 19 01/11/2015 0817   AST 23 11/16/2014 0949   ALT 33 01/11/2015 0817   ALT 23 11/16/2014 0949   ALKPHOS 61 01/11/2015 0817   ALKPHOS 40 11/16/2014 0949   BILITOT 0.36 01/11/2015 0817   BILITOT 0.6 11/16/2014 0949   GFRNONAA 76* 11/16/2014 0949   GFRAA 88* 11/16/2014 0949    No results found for: SPEP, UPEP  Lab Results  Component Value Date   WBC 5.7 01/11/2015   NEUTROABS 4.2 01/11/2015   HGB 12.3 01/11/2015   HCT 37.6 01/11/2015   MCV 97.5 01/11/2015   PLT 312 01/11/2015      Chemistry      Component Value Date/Time   NA 143 01/11/2015 0817   NA 141 11/16/2014 0949   K 3.8 01/11/2015 0817   K 4.5 11/16/2014 0949   CL 103 11/16/2014 0949   CO2 25 01/11/2015 0817   CO2 27 11/16/2014 0949   BUN 14.9 01/11/2015 0817   BUN 12 11/16/2014 0949   CREATININE 0.8 01/11/2015 0817   CREATININE 0.87 11/16/2014 0949      Component Value Date/Time   CALCIUM 9.2 01/11/2015 0817   CALCIUM 10.0 11/16/2014 0949   ALKPHOS 61 01/11/2015 0817   ALKPHOS 40 11/16/2014 0949   AST 19 01/11/2015 0817   AST 23 11/16/2014 0949   ALT 33 01/11/2015 0817   ALT 23 11/16/2014 0949   BILITOT 0.36 01/11/2015 0817   BILITOT 0.6 11/16/2014 0949      ASSESSMENT & PLAN:  Skin  infection This is getting worse. I proceeded to excise the wound to drain the pus from the infected cyst. 3 cc of 1% lidocaine is used to numb the skin. The skin is swabbed with iodine. Approximately 5 cc of pus was drained from the infected cyst. She is given 2 percocet for pain and recommended to keep the skin dry and to finish her course of oral antibiotics     No orders of the defined types were placed in this encounter.   All questions were answered. The patient knows to call the clinic with any problems, questions or concerns. No barriers to learning was detected. I spent 25 minutes counseling the patient face to face. The total time spent in the appointment was 30 minutes and more than 50% was on counseling and review of test  results     [place], [place], MD 01/29/2015 4:35 PM

## 2015-01-30 ENCOUNTER — Ambulatory Visit: Payer: BLUE CROSS/BLUE SHIELD

## 2015-01-30 ENCOUNTER — Ambulatory Visit (HOSPITAL_COMMUNITY): Payer: BLUE CROSS/BLUE SHIELD

## 2015-01-30 ENCOUNTER — Encounter (HOSPITAL_COMMUNITY): Admission: RE | Admit: 2015-01-30 | Payer: BLUE CROSS/BLUE SHIELD | Source: Ambulatory Visit

## 2015-01-30 ENCOUNTER — Encounter (HOSPITAL_COMMUNITY)
Admission: RE | Admit: 2015-01-30 | Discharge: 2015-01-30 | Disposition: A | Discharge status: Home / Self Care | Payer: BLUE CROSS/BLUE SHIELD | Source: Ambulatory Visit | Attending: Hematology and Oncology | Admitting: Hematology and Oncology

## 2015-01-30 ENCOUNTER — Other Ambulatory Visit (HOSPITAL_BASED_OUTPATIENT_CLINIC_OR_DEPARTMENT_OTHER): Payer: BLUE CROSS/BLUE SHIELD

## 2015-01-30 VITALS — BP 116/68 | HR 63 | Temp 98.1°F

## 2015-01-30 DIAGNOSIS — C8330 Diffuse large B-cell lymphoma, unspecified site: Secondary | ICD-10-CM

## 2015-01-30 DIAGNOSIS — C8335 Diffuse large B-cell lymphoma, lymph nodes of inguinal region and lower limb: Secondary | ICD-10-CM | POA: Diagnosis not present

## 2015-01-30 DIAGNOSIS — Z95828 Presence of other vascular implants and grafts: Secondary | ICD-10-CM

## 2015-01-30 LAB — CBC WITH DIFFERENTIAL/PLATELET
BASO%: 0.6 % (ref 0.0–2.0)
BASOS ABS: 0 10*3/uL (ref 0.0–0.1)
EOS ABS: 0 10*3/uL (ref 0.0–0.5)
EOS%: 0.2 % (ref 0.0–7.0)
HEMATOCRIT: 37.8 % (ref 34.8–46.6)
HEMOGLOBIN: 12.7 g/dL (ref 11.6–15.9)
LYMPH#: 1 10*3/uL (ref 0.9–3.3)
LYMPH%: 13.5 % — ABNORMAL LOW (ref 14.0–49.7)
MCH: 32.5 pg (ref 25.1–34.0)
MCHC: 33.4 g/dL (ref 31.5–36.0)
MCV: 97.2 fL (ref 79.5–101.0)
MONO#: 0.4 10*3/uL (ref 0.1–0.9)
MONO%: 6 % (ref 0.0–14.0)
NEUT#: 5.9 10*3/uL (ref 1.5–6.5)
NEUT%: 79.7 % — ABNORMAL HIGH (ref 38.4–76.8)
Platelets: 339 10*3/uL (ref 145–400)
RBC: 3.89 10*6/uL (ref 3.70–5.45)
RDW: 17.7 % — AB (ref 11.2–14.5)
WBC: 7.4 10*3/uL (ref 3.9–10.3)

## 2015-01-30 LAB — COMPREHENSIVE METABOLIC PANEL (CC13)
ALBUMIN: 3.9 g/dL (ref 3.5–5.0)
ALT: 29 U/L (ref 0–55)
ANION GAP: 13 meq/L — AB (ref 3–11)
AST: 21 U/L (ref 5–34)
Alkaline Phosphatase: 68 U/L (ref 40–150)
BUN: 14.9 mg/dL (ref 7.0–26.0)
CALCIUM: 9.8 mg/dL (ref 8.4–10.4)
CHLORIDE: 107 meq/L (ref 98–109)
CO2: 22 meq/L (ref 22–29)
CREATININE: 0.7 mg/dL (ref 0.6–1.1)
EGFR: >90 mL/min/{1.73_m2} (ref >90)
Glucose: 103 mg/dl (ref 70–140)
POTASSIUM: 4.3 meq/L (ref 3.5–5.1)
Sodium: 142 mEq/L (ref 136–145)
Total Bilirubin: 0.29 mg/dL (ref 0.20–1.20)
Total Protein: 7 g/dL (ref 6.4–8.3)

## 2015-01-30 LAB — GLUCOSE, CAPILLARY: Glucose-Capillary: 92 mg/dL (ref 70–99)

## 2015-01-30 LAB — LACTATE DEHYDROGENASE (CC13): LDH: 220 U/L (ref 125–245)

## 2015-01-30 MED ORDER — FLUDEOXYGLUCOSE F - 18 (FDG) INJECTION
7.5000 | Freq: Once | INTRAVENOUS | Status: AC | PRN
Start: 2015-01-30 — End: 2015-01-30
  Administered 2015-01-30: 7.5 via INTRAVENOUS

## 2015-01-30 MED ORDER — SODIUM CHLORIDE 0.9 % IJ SOLN
10.0000 mL | INTRAMUSCULAR | Status: DC | PRN
  Administered 2015-01-30: 10 mL via INTRAVENOUS
  Filled 2015-01-30: qty 10

## 2015-01-30 MED ORDER — HEPARIN SOD (PORK) LOCK FLUSH 100 UNIT/ML IV SOLN
500.0000 [IU] | Freq: Once | INTRAVENOUS | Status: AC
  Administered 2015-01-30: 500 [IU] via INTRAVENOUS
  Filled 2015-01-30: qty 5

## 2015-01-30 NOTE — Patient Instructions (Signed)

## 2015-01-31 ENCOUNTER — Ambulatory Visit (HOSPITAL_BASED_OUTPATIENT_CLINIC_OR_DEPARTMENT_OTHER): Payer: BLUE CROSS/BLUE SHIELD | Admitting: Hematology and Oncology

## 2015-01-31 ENCOUNTER — Encounter: Payer: Self-pay | Admitting: Hematology and Oncology

## 2015-01-31 ENCOUNTER — Telehealth: Payer: Self-pay | Admitting: Hematology and Oncology

## 2015-01-31 VITALS — BP 134/79 | HR 66 | Temp 97.8°F | Resp 18 | Ht 63.0 in | Wt 153.8 lb

## 2015-01-31 DIAGNOSIS — Z720 Tobacco use: Secondary | ICD-10-CM

## 2015-01-31 DIAGNOSIS — C8330 Diffuse large B-cell lymphoma, unspecified site: Secondary | ICD-10-CM | POA: Diagnosis not present

## 2015-01-31 DIAGNOSIS — L089 Local infection of the skin and subcutaneous tissue, unspecified: Secondary | ICD-10-CM

## 2015-01-31 NOTE — Progress Notes (Signed)
[facility] OFFICE PROGRESS NOTE  Patient Care Team: Maurice Small, MD as PCP - General (Family Medicine)  SUMMARY OF ONCOLOGIC HISTORY: Oncology History   Diffuse large B cell lymphoma   Staging form: Lymphoid Neoplasms, AJCC 6th Edition     Clinical stage from 12/01/2014: Stage I - Signed by Heath Lark, MD on 12/01/2014        Diffuse large B cell lymphoma   09/08/2014 Imaging Ultrasound pelvis detected abnormal soft tissue mass in the left groin   09/13/2014 Imaging Ultrasound abdomen showed up to 7 cm mass-like area of mild hypoechogenicity in the left hepatic lobe   09/21/2014 Imaging MRI of the abdomen showed large cavernous hemangioma in the liver and bilateral adrenal nodules   10/24/2014 Procedure She underwent ultrasound-guided biopsy of the left groin mass   10/24/2014 Pathology Results Accession: [id number] confirm diagnosis of diffuse large B-cell lymphoma   11/15/2014 Imaging Echocardiogram showed normal ejection fraction   11/17/2014 Procedure She has port placement.   11/23/2014 Bone Marrow Biopsy Accession: [id number] is negative for lymphoma   11/30/2014 - 01/11/2015 Chemotherapy She received chemotherapy with RCHOP X 3 cycles   11/30/2014 Adverse Reaction Treatment was complicated by mild hypersensitivity reaction to rituximab   01/30/2015 Imaging Repeat PET CT scan showed complete response to the lymphadenopathy.    INTERVAL HISTORY: Please see below for problem oriented charting. She returns today for further follow-up regarding lymphoma and her infectious cyst on the back. Overall, she feels about the same.  REVIEW OF SYSTEMS:   Constitutional: Denies fevers, chills or abnormal weight loss Eyes: Denies blurriness of vision Ears, nose, mouth, throat, and face: Denies mucositis or sore throat Respiratory: Denies cough, dyspnea or wheezes Cardiovascular: Denies palpitation, chest discomfort or lower extremity swelling Gastrointestinal:  Denies nausea, heartburn  or change in bowel habits Skin: Denies abnormal skin rashes Lymphatics: Denies new lymphadenopathy or easy bruising Neurological:Denies numbness, tingling or new weaknesses Behavioral/Psych: Mood is stable, no new changes  All other systems were reviewed with the patient and are negative.  I have reviewed the past medical history, past surgical history, social history and family history with the patient and they are unchanged from previous note.  ALLERGIES:  is allergic to adhesive; influenza vaccines; prednisone; and sulfa antibiotics.  MEDICATIONS:  Current Outpatient Prescriptions  Medication Sig Dispense Refill  . cephALEXin (KEFLEX) 500 MG capsule Take 1 capsule (500 mg total) by mouth 3 (three) times daily. 21 capsule 0  . Glucosamine-Chondroit-Vit C-Mn (GLUCOSAMINE CHONDR 1500 COMPLX PO) Take 1 tablet by mouth daily.     Marland Kitchen HYDROcodone-acetaminophen (NORCO) 7.5-325 MG per tablet Take 7.5 tablets by mouth.  0  . ibuprofen (ADVIL) 200 MG tablet Take 200 mg by mouth every 6 (six) hours as needed.    . Multiple Vitamins-Minerals (MULTIVITAMIN PO) Take by mouth daily.    . ondansetron (ZOFRAN) 8 MG tablet Take 1 tablet (8 mg total) by mouth every 8 (eight) hours as needed. 30 tablet 1  . prochlorperazine (COMPAZINE) 10 MG tablet Take 1 tablet (10 mg total) by mouth every 6 (six) hours as needed (Nausea or vomiting). 30 tablet 6  . traMADol (ULTRAM) 50 MG tablet Take 1 tablet (50 mg total) by mouth every 6 (six) hours as needed for moderate pain. 60 tablet 0  . UNABLE TO FIND Take 1 tablet by mouth at bedtime. Med Name: Supplement "DGL"     No current facility-administered medications for this visit.    PHYSICAL EXAMINATION: ECOG  PERFORMANCE STATUS: 0 - Asymptomatic  Filed Vitals:   01/31/15 1356  BP: 134/79  Pulse: 66  Temp: 97.8 F (36.6 C)  Resp: 18   Filed Weights   01/31/15 1356  Weight: 153 lb 12.8 oz (69.763 kg)    GENERAL:alert, no distress and comfortable SKIN:  skin lesion on her back is healing with minimal pus/discharge EYES: normal, Conjunctiva are pink and non-injected, sclera clear OROPHARYNX:no exudate, no erythema and lips, buccal mucosa, and tongue normal  Musculoskeletal:no cyanosis of digits and no clubbing  NEURO: alert & oriented x 3 with fluent speech, no focal motor/sensory deficits  LABORATORY DATA:  I have reviewed the data as listed    Component Value Date/Time   NA 142 01/30/2015 0911   NA 141 11/16/2014 0949   K 4.3 01/30/2015 0911   K 4.5 11/16/2014 0949   CL 103 11/16/2014 0949   CO2 22 01/30/2015 0911   CO2 27 11/16/2014 0949   GLUCOSE 103 01/30/2015 0911   GLUCOSE 101* 11/16/2014 0949   BUN 14.9 01/30/2015 0911   BUN 12 11/16/2014 0949   CREATININE 0.7 01/30/2015 0911   CREATININE 0.87 11/16/2014 0949   CALCIUM 9.8 01/30/2015 0911   CALCIUM 10.0 11/16/2014 0949   PROT 7.0 01/30/2015 0911   PROT 6.8 11/16/2014 0949   ALBUMIN 3.9 01/30/2015 0911   ALBUMIN 4.0 11/16/2014 0949   AST 21 01/30/2015 0911   AST 23 11/16/2014 0949   ALT 29 01/30/2015 0911   ALT 23 11/16/2014 0949   ALKPHOS 68 01/30/2015 0911   ALKPHOS 40 11/16/2014 0949   BILITOT 0.29 01/30/2015 0911   BILITOT 0.6 11/16/2014 0949   GFRNONAA 76* 11/16/2014 0949   GFRAA 88* 11/16/2014 0949    No results found for: SPEP, UPEP  Lab Results  Component Value Date   WBC 7.4 01/30/2015   NEUTROABS 5.9 01/30/2015   HGB 12.7 01/30/2015   HCT 37.8 01/30/2015   MCV 97.2 01/30/2015   PLT 339 01/30/2015      Chemistry      Component Value Date/Time   NA 142 01/30/2015 0911   NA 141 11/16/2014 0949   K 4.3 01/30/2015 0911   K 4.5 11/16/2014 0949   CL 103 11/16/2014 0949   CO2 22 01/30/2015 0911   CO2 27 11/16/2014 0949   BUN 14.9 01/30/2015 0911   BUN 12 11/16/2014 0949   CREATININE 0.7 01/30/2015 0911   CREATININE 0.87 11/16/2014 0949      Component Value Date/Time   CALCIUM 9.8 01/30/2015 0911   CALCIUM 10.0 11/16/2014 0949   ALKPHOS  68 01/30/2015 0911   ALKPHOS 40 11/16/2014 0949   AST 21 01/30/2015 0911   AST 23 11/16/2014 0949   ALT 29 01/30/2015 0911   ALT 23 11/16/2014 0949   BILITOT 0.29 01/30/2015 0911   BILITOT 0.6 11/16/2014 0949       RADIOGRAPHIC STUDIES: I have personally reviewed the radiological images as listed and agreed with the findings in the report. Nm Pet Image Restag (ps) Skull Base To Thigh  01/30/2015   CLINICAL DATA:  Subsequent treatment strategy for diffuse large B-cell lymphoma. Restaging examination.  EXAM: NUCLEAR MEDICINE PET SKULL BASE TO THIGH  TECHNIQUE: 7.5 mCi F-18 FDG was injected intravenously. Full-ring PET imaging was performed from the skull base to thigh after the radiotracer. CT data was obtained and used for attenuation correction and anatomic localization.  FASTING BLOOD GLUCOSE:  Value: 92 mg/dl  COMPARISON:  PET-CT 11/29/2014.  FINDINGS: NECK  No hypermetabolic lymph nodes in the neck.  CHEST  Small amount of hypermetabolism associated with the tip of the patient's left-sided subclavian Port-A-Cath, presumably within some catheter associated thrombus or fibrin. No hypermetabolic mediastinal or hilar nodes. 3 mm right lower lobe pulmonary nodule (image 48 of series 6), unchanged. No other suspicious pulmonary nodules on the CT scan. Skin thickening and infiltration of the subcutaneous fat in the medial aspect of the upper back in the right paraspinal region (image 53 of series 4) measuring approximately 3.2 x 1.7 cm, which demonstrates diffuse hypermetabolism (SUVmax = 10.4). Medial to this (image 51 of series 4) there is a 9 mm soft tissue attenuation nodule which demonstrates no internal hypermetabolism in the subcutaneous fat.  ABDOMEN/PELVIS  Previously noted enlarged and hypermetabolic left inguinal lymph node has decreased in size, currently measuring only 12 x 16 mm (image 172 of series 4), previously 25 x 30 mm on 11/29/2014. This lymph node also now demonstrates only low-level  metabolic activity (SUVmax = 1.6), significantly decreased compared to the prior examination. No abnormal hypermetabolic activity within the liver, pancreas, adrenal glands, or spleen. There is an unusual lobular appearance in the left lobe of the liver involving segments 2 and 3 posteriorly, where there is a mass-like area of low attenuation (40 HU), favored to represent focal fatty infiltration. This has no associated altered metabolism on the PET portion of the examination. 2.5 x 2.8 cm low-attenuation (-8 HU) left adrenal nodule, compatible with an adrenal adenoma. Normal appendix. No significant volume of ascites. No pneumoperitoneum. No pathologic dilatation of small bowel. Status post hysterectomy. Ovaries are unremarkable in appearance.  SKELETON  No focal hypermetabolic activity to suggest skeletal metastasis.  IMPRESSION: 1. Interval decreased size and metabolic activity of the previously noted left inguinal lymph node, indicative of a positive response to therapy, as detailed above. 2. Today's study demonstrates new skin thickening and infiltration of the subcutaneous fat of the medial aspect of the upper right back in the paraspinal region, where there is a 3.2 x 1.7 cm lesion that is diffusely hypermetabolic. While this could be infectious or inflammatory in etiology, correlation with physical examination is recommended to exclude a new site of cutaneous/subcutaneous disease. 3. No other sites of potential disease involvement in the neck, chest, abdomen or pelvis. 4. Unusual mass-like appearance of the left lobe of the liver, similar in retrospect to the prior examination, but more conspicuous on today's study because of the focal low-attenuation in this region. This is strongly favored to be a benign finding, likely focal fatty infiltration, as there is no associated hypermetabolism. This could be definitively characterized with MRI of the abdomen with and without IV gadolinium if of clinical concern.  5. 2.5 x 2.8 cm left adrenal adenoma is unchanged.   Electronically Signed   By: Vinnie Langton M.D.   On: 01/30/2015 15:12     ASSESSMENT & PLAN:  Diffuse large B cell lymphoma PET CT scan review excellent response to treatment. I recommend consolidation treatment with radiation therapy and I will consult radiation oncologist for this. I recommend repeat PET CT scan in 6 months to ensure complete resolution of abnormalities seen. In the meantime, I recommend port flush every 6-8 weeks to keep her port patent. I will see her back within next 2-3 weeks to follow.   Skin infection Clinically, it is improving. I attempted to express pus from the wound and only get minimum discharge. She has completed a course of  antibiotics and I recommend only wound care at this point.   Tobacco abuse I spent some time counseling the patient the importance of tobacco cessation. she is currently attempting to quit on her own  I gave her patient education handout and encouraged her to sign up for smoking cessation class.     Orders Placed This Encounter  Procedures  . Ambulatory referral to Radiation Oncology    Referral Priority:  Routine    Referral Type:  Consultation    Referral Reason:  Specialty Services Required    Referred to Provider:  Eppie Gibson, MD    Requested Specialty:  Radiation Oncology    Number of Visits Requested:  1   All questions were answered. The patient knows to call the clinic with any problems, questions or concerns. No barriers to learning was detected. I spent 15 minutes counseling the patient face to face. The total time spent in the appointment was 20 minutes and more than 50% was on counseling and review of test results     [facility], [place], MD 01/31/2015 2:17 PM

## 2015-01-31 NOTE — Telephone Encounter (Signed)
Gave patient avs report and appointment with Dr. Isidore Moos for 02/15/15. Date per patient request. No follow up with NG and no other orders at this time.

## 2015-01-31 NOTE — Assessment & Plan Note (Signed)
PET CT scan review excellent response to treatment. I recommend consolidation treatment with radiation therapy and I will consult radiation oncologist for this. I recommend repeat PET CT scan in 6 months to ensure complete resolution of abnormalities seen. In the meantime, I recommend port flush every 6-8 weeks to keep her port patent. I will see her back within next 2-3 weeks to follow.

## 2015-01-31 NOTE — Assessment & Plan Note (Signed)
Clinically, it is improving. I attempted to express pus from the wound and only get minimum discharge. She has completed a course of antibiotics and I recommend only wound care at this point.

## 2015-01-31 NOTE — Assessment & Plan Note (Signed)
I spent some time counseling the patient the importance of tobacco cessation. she is currently attempting to quit on her own  I gave her patient education handout and encouraged her to sign up for smoking cessation class.  

## 2015-02-09 ENCOUNTER — Encounter: Payer: Self-pay | Admitting: Radiation Oncology

## 2015-02-09 NOTE — Progress Notes (Signed)
Lymphoma Location(s) / Histology: left groin  Robbie Lis presented 4 months ago with symptoms of: palpable lymphadenopathy of left groin since June 2014, fluctuated in size, not painful  Biopsies of  (if applicable) revealed:  03/13/60 Diagnosis Bone Marrow, Aspirate,Biopsy, and Clot - NORMOCELLULAR MARROW WITH TRILINEAGE HEMATOPOIESIS AND MATURATION. - NO EVIDENCE OF LYMPHOMA. PERIPHERAL BLOOD: - UNREMARKABLE.  Interpretation Bone Marrow Flow Cytometry - NO MONOCLONAL B-CELL OR PHENOTYPICALLY ABERRANT T-CELL POPULATION.  10/24/14 Diagnosis Lymph node, biopsy - DIFFUSE LARGE B-CELL LYMPHOMA. - SEE ONCOLOGY TABLE. Microscopic Comment LYMPHOMA  Past/Anticipated interventions by medical oncology, if any: Dr Alvy Bimler:   11/30/2014 - 01/11/2015 Chemotherapy She received chemotherapy with RCHOP X 3 cycles      I recommend consolidation treatment with radiation therapy and I will consult radiation oncologist for this.  Weight changes, if any, over the past 6 months: slight weight gain  Recurrent fevers, or drenching night sweats, if any:   SAFETY ISSUES:  Prior radiation? no  Pacemaker/ICD? no  Possible current pregnancy? no  Is the patient on methotrexate? no  Current Complaints / other details:  Single Hx left armpit abnormal  lymphadenopathy 8 years ago that was biopsied, negative.

## 2015-02-15 ENCOUNTER — Ambulatory Visit
Admission: RE | Admit: 2015-02-15 | Discharge: 2015-02-15 | Disposition: A | Discharge status: Home / Self Care | Payer: BLUE CROSS/BLUE SHIELD | Source: Ambulatory Visit | Attending: Radiation Oncology | Admitting: Radiation Oncology

## 2015-02-15 ENCOUNTER — Ambulatory Visit: Payer: BLUE CROSS/BLUE SHIELD

## 2015-02-15 ENCOUNTER — Encounter: Payer: Self-pay | Admitting: Radiation Oncology

## 2015-02-15 VITALS — BP 118/66 | HR 68 | Temp 98.2°F | Resp 20 | Ht 63.0 in | Wt 153.1 lb

## 2015-02-15 DIAGNOSIS — F17210 Nicotine dependence, cigarettes, uncomplicated: Secondary | ICD-10-CM | POA: Diagnosis not present

## 2015-02-15 DIAGNOSIS — L599 Disorder of the skin and subcutaneous tissue related to radiation, unspecified: Secondary | ICD-10-CM | POA: Insufficient documentation

## 2015-02-15 DIAGNOSIS — L821 Other seborrheic keratosis: Secondary | ICD-10-CM | POA: Diagnosis not present

## 2015-02-15 DIAGNOSIS — Z90710 Acquired absence of both cervix and uterus: Secondary | ICD-10-CM | POA: Insufficient documentation

## 2015-02-15 DIAGNOSIS — Z9221 Personal history of antineoplastic chemotherapy: Secondary | ICD-10-CM | POA: Diagnosis not present

## 2015-02-15 DIAGNOSIS — Z510 Encounter for antineoplastic radiation therapy: Secondary | ICD-10-CM | POA: Insufficient documentation

## 2015-02-15 DIAGNOSIS — C8335 Diffuse large B-cell lymphoma, lymph nodes of inguinal region and lower limb: Secondary | ICD-10-CM | POA: Diagnosis not present

## 2015-02-15 DIAGNOSIS — C8330 Diffuse large B-cell lymphoma, unspecified site: Secondary | ICD-10-CM

## 2015-02-15 HISTORY — DX: Malignant (primary) neoplasm, unspecified: C80.1

## 2015-02-15 NOTE — Addendum Note (Signed)
Encounter addended by: Benn Moulder, RN on: 02/15/2015  6:42 PM<BR>     Documentation filed: Flowsheet VN, BPA Follow-up Actions

## 2015-02-15 NOTE — Progress Notes (Signed)
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 Diffuse large B cell lymphoma 202.80 C83.30     HISTORY OF PRESENT ILLNESS:Alexandra Allen is a 54 y.o. female who presented with a left groin mass in June 2014. Ultimately, this was biopsied 10/24/14. Revealing diffuse large B-cell lymphoma. On 11/23/14 bone marrow biopsy was negative. Peripheral blood was unremarkable. Her intial PET on 11/29/14, showed a single hypermetabolic 25 mm left inguinal node. Work up of liver lesion revealed benign findings. She went on to receive RCHOP x 3 cycles. Repeat PET scan on 01/30/15, showed a 3.2 cm upper right back lesion, that could be infectious or inflammatory. This was a cyst that has been drained, since.The left inguinal mass measured 12 x 16 mm compared to 25 x 30 mm on to 11/29/14. SUV max was 1.6. Dr Alvy Bimler released her for my advice and consultation re: Radiotherapy.  She reports severe knee pain when kneeling x3 weeks. Patient denies night sweats, fever and weight loss before diagnosis.  PREVIOUS RADIATION THERAPY: No  PAST MEDICAL HISTORY:  has a past medical history of Thyroid disease; Vocal cord edema; Diffuse large B cell lymphoma (11/09/2014); Complication of anesthesia; History of bronchitis; Hoarseness; Lightheaded; Arthritis; Myalgia; Joint pain; Joint swelling; GERD (gastroesophageal reflux disease); Urinary frequency; Skin infection (01/23/2015); and Cancer (10/2014).    PAST SURGICAL HISTORY: Past Surgical History  Procedure Laterality Date  . Abdominal hysterectomy  2001  . Lymph node biopsy      under left arm and groin biopsy  . Laryngoscopy    . Biopsy thyroid    . Portacath placement  Left 11/17/2014    Procedure: INSERTION PORT-A-CATH LEFT SUBCLAVIAN;  Surgeon: Stark Klein, MD;  Location: [facility] OR;  Service: General;  Laterality: Left;    FAMILY HISTORY: family history includes Cancer in her father and mother; Heart disease in her father; Hypertension in her father and mother.  SOCIAL HISTORY:  reports that she has been smoking Cigarettes.  She has a 17.5 pack-year smoking history. She has never used smokeless tobacco. She reports that she drinks alcohol. She reports that she does not use illicit drugs.  ALLERGIES: Adhesive; Influenza vaccines; Prednisone; and Sulfa antibiotics  MEDICATIONS:  Current Outpatient Prescriptions  Medication Sig Dispense Refill  . cephALEXin (KEFLEX) 500 MG capsule Take 1 capsule (500 mg total) by mouth 3 (three) times daily. 21 capsule 0  . Glucosamine-Chondroit-Vit C-Mn (GLUCOSAMINE CHONDR 1500 COMPLX PO) Take 1 tablet by mouth daily.     Marland Kitchen HYDROcodone-acetaminophen (NORCO) 7.5-325 MG per tablet Take 7.5 tablets by mouth.  0  . ibuprofen (ADVIL) 200 MG tablet Take 200 mg by mouth every 6 (six) hours as needed.    . Multiple Vitamins-Minerals (MULTIVITAMIN PO) Take by mouth daily.    . ondansetron (ZOFRAN) 8 MG tablet Take 1 tablet (8 mg total) by mouth every 8 (eight) hours as needed. 30 tablet 1  . prochlorperazine (COMPAZINE) 10 MG tablet Take 1 tablet (10 mg total) by mouth every 6 (six) hours as needed (Nausea or vomiting). 30 tablet 6  . traMADol (ULTRAM) 50 MG tablet Take 1 tablet (50 mg total) by mouth every 6 (six) hours  as needed for moderate pain. 60 tablet 0  . UNABLE TO FIND Take 1 tablet by mouth at bedtime. Med Name: Supplement "DGL"     No current facility-administered medications for this encounter.    REVIEW OF SYSTEMS:  Notable for that above.   PHYSICAL EXAM:  vitals were not taken for this visit.  General: Alert and oriented, in no acute distress HEENT: Head is normocephalic. Extraocular movements are intact.  Oropharynx is clear. Neck: Neck is supple, no palpable cervical or supraclavicular lymphadenopathy. Heart: Regular in rate and rhythm with no murmurs, rubs, or gallops. Chest: Clear to auscultation bilaterally, with no rhonchi, wheezes, or rales.  Abdomen: Soft, nontender, nondistended, with no rigidity or guarding. Extremities: No cyanosis or edema. No obvious masses in left knee or left popliteal fossa. Lymphatics: see Neck Exam; ~1cm palpable left groin node Skin: No concerning lesions. Crusted drained cyst site on back Musculoskeletal: symmetric strength and muscle tone throughout.  Neurologic: Cranial nerves II through XII are grossly intact. No obvious focalities. Speech is fluent. Coordination is intact. Psychiatric: Judgment and insight are intact. Affect is appropriate.    ECOG = 0  0 - Asymptomatic (Fully active, able to carry on all predisease activities without restriction)  1 - Symptomatic but completely ambulatory (Restricted in physically strenuous activity but ambulatory and able to carry out work of a light or sedentary nature. For example, light housework, office work)  2 - Symptomatic, <50% in bed during the day (Ambulatory and capable of all self care but unable to carry out any work activities. Up and about more than 50% of waking hours)  3 - Symptomatic, >50% in bed, but not bedbound (Capable of only limited self-care, confined to bed or chair 50% or more of waking hours)  4 - Bedbound (Completely disabled. Cannot carry on any self-care. Totally confined to bed or chair)  5 - Death   Eustace Pen MM, Creech RH, Tormey DC, et al. [phone]). "Toxicity and response criteria of the [facility]". [place] Oncol. 5 (6): 649-55   LABORATORY DATA:  Lab Results  Component Value Date   WBC 7.4 01/30/2015   HGB 12.7 01/30/2015   HCT 37.8 01/30/2015   MCV 97.2 01/30/2015   PLT 339 01/30/2015   CMP     Component Value Date/Time   NA 142 01/30/2015  0911   NA 141 11/16/2014 0949   K 4.3 01/30/2015 0911   K 4.5 11/16/2014 0949   CL 103 11/16/2014 0949   CO2 22 01/30/2015 0911   CO2 27 11/16/2014 0949   GLUCOSE 103 01/30/2015 0911   GLUCOSE 101* 11/16/2014 0949   BUN 14.9 01/30/2015 0911   BUN 12 11/16/2014 0949   CREATININE 0.7 01/30/2015 0911   CREATININE 0.87 11/16/2014 0949   CALCIUM 9.8 01/30/2015 0911   CALCIUM 10.0 11/16/2014 0949   PROT 7.0 01/30/2015 0911   PROT 6.8 11/16/2014 0949   ALBUMIN 3.9 01/30/2015 0911   ALBUMIN 4.0 11/16/2014 0949   AST 21 01/30/2015 0911   AST 23 11/16/2014 0949   ALT 29 01/30/2015 0911   ALT 23 11/16/2014 0949   ALKPHOS 68 01/30/2015 0911   ALKPHOS 40 11/16/2014 0949   BILITOT 0.29 01/30/2015 0911   BILITOT 0.6 11/16/2014 0949   GFRNONAA 76* 11/16/2014 0949   GFRAA 88* 11/16/2014 0949         RADIOGRAPHY: Nm Pet Image Restag (ps) Skull Base To Thigh  01/30/2015   CLINICAL DATA:  Subsequent  treatment strategy for diffuse large B-cell lymphoma. Restaging examination.  EXAM: NUCLEAR MEDICINE PET SKULL BASE TO THIGH  TECHNIQUE: 7.5 mCi F-18 FDG was injected intravenously. Full-ring PET imaging was performed from the skull base to thigh after the radiotracer. CT data was obtained and used for attenuation correction and anatomic localization.  FASTING BLOOD GLUCOSE:  Value: 92 mg/dl  COMPARISON:  PET-CT 11/29/2014.  FINDINGS: NECK  No hypermetabolic lymph nodes in the neck.  CHEST  Small amount of hypermetabolism associated with the tip of the patient's left-sided subclavian Port-A-Cath, presumably within some catheter associated thrombus or fibrin. No hypermetabolic mediastinal or hilar nodes. 3 mm right lower lobe pulmonary nodule (image 48 of series 6), unchanged. No other suspicious pulmonary nodules on the CT scan. Skin thickening and infiltration of the subcutaneous fat in the medial aspect of the upper back in the right paraspinal region (image 53 of series 4) measuring approximately 3.2 x  1.7 cm, which demonstrates diffuse hypermetabolism (SUVmax = 10.4). Medial to this (image 51 of series 4) there is a 9 mm soft tissue attenuation nodule which demonstrates no internal hypermetabolism in the subcutaneous fat.  ABDOMEN/PELVIS  Previously noted enlarged and hypermetabolic left inguinal lymph node has decreased in size, currently measuring only 12 x 16 mm (image 172 of series 4), previously 25 x 30 mm on 11/29/2014. This lymph node also now demonstrates only low-level metabolic activity (SUVmax = 1.6), significantly decreased compared to the prior examination. No abnormal hypermetabolic activity within the liver, pancreas, adrenal glands, or spleen. There is an unusual lobular appearance in the left lobe of the liver involving segments 2 and 3 posteriorly, where there is a mass-like area of low attenuation (40 HU), favored to represent focal fatty infiltration. This has no associated altered metabolism on the PET portion of the examination. 2.5 x 2.8 cm low-attenuation (-8 HU) left adrenal nodule, compatible with an adrenal adenoma. Normal appendix. No significant volume of ascites. No pneumoperitoneum. No pathologic dilatation of small bowel. Status post hysterectomy. Ovaries are unremarkable in appearance.  SKELETON  No focal hypermetabolic activity to suggest skeletal metastasis.  IMPRESSION: 1. Interval decreased size and metabolic activity of the previously noted left inguinal lymph node, indicative of a positive response to therapy, as detailed above. 2. Today's study demonstrates new skin thickening and infiltration of the subcutaneous fat of the medial aspect of the upper right back in the paraspinal region, where there is a 3.2 x 1.7 cm lesion that is diffusely hypermetabolic. While this could be infectious or inflammatory in etiology, correlation with physical examination is recommended to exclude a new site of cutaneous/subcutaneous disease. 3. No other sites of potential disease involvement  in the neck, chest, abdomen or pelvis. 4. Unusual mass-like appearance of the left lobe of the liver, similar in retrospect to the prior examination, but more conspicuous on today's study because of the focal low-attenuation in this region. This is strongly favored to be a benign finding, likely focal fatty infiltration, as there is no associated hypermetabolism. This could be definitively characterized with MRI of the abdomen with and without IV gadolinium if of clinical concern. 5. 2.5 x 2.8 cm left adrenal adenoma is unchanged.   Electronically Signed   By: Vinnie Langton M.D.   On: 01/30/2015 15:12      IMPRESSION/PLAN:Today, I talked to the patient about the findings and work-up thus far. We discussed the patient's diagnosis of Stage I non-Hodgkins lymphoma  and general treatment for this, highlighting the role of  radiotherapy in the management. We discussed the available radiation techniques, and focused on the details of logistics and delivery.I recommended involved site radiation therapy to left groin to 36 Gy in 20 fractions     We discussed the risks, benefits, and side effects of radiotherapy. Side effects may include but not necessarily be limited to: Skin irritation and fatigue; serious injury to pelvic organs is rare. No guarantees of treatment were given. A consent form was signed and placed in the patient's medical record. The patient was encouraged to ask questions that I answered to the best of my ability.    Knee pain is described by pt to be felt in the superficial soft tissues with no palpable masses on exam today. She will apply ice, try ibuprofren and stop kneeling on it. If this does not improve she will let me know and we can pursue an MRI for further workup.     __________________________________________   Eppie Gibson, MD   This document serves as a record of services personally performed by Eppie Gibson, MD. It was created on his behalf by Derek Mound, a trained  medical scribe. The creation of this record is based on the scribe's personal observations and the provider's statements to them. This document has been checked and approved by the attending provider.

## 2015-02-15 NOTE — Progress Notes (Signed)
Alexandra Allen reports "severe pain" in her left knee as a level 10+ when she kneels on it.  No other pain noted.  Continues with hoarseness but denies pain. Maintaining weight.

## 2015-02-16 ENCOUNTER — Telehealth: Payer: Self-pay | Admitting: Hematology and Oncology

## 2015-02-16 NOTE — Telephone Encounter (Signed)
returned call and s.w sched appt for flush with pt

## 2015-02-20 ENCOUNTER — Ambulatory Visit
Admission: RE | Admit: 2015-02-20 | Discharge: 2015-02-20 | Disposition: A | Discharge status: Home / Self Care | Payer: BLUE CROSS/BLUE SHIELD | Source: Ambulatory Visit | Attending: Radiation Oncology | Admitting: Radiation Oncology

## 2015-02-20 DIAGNOSIS — Z510 Encounter for antineoplastic radiation therapy: Secondary | ICD-10-CM | POA: Diagnosis not present

## 2015-02-20 DIAGNOSIS — C8330 Diffuse large B-cell lymphoma, unspecified site: Secondary | ICD-10-CM

## 2015-02-20 NOTE — Progress Notes (Signed)
  Radiation Oncology         [phone] ________________________________  Name: Alexandra Allen MRN: [id number]  Date: 02/20/2015  DOB: Jun 17, 1961  SIMULATION AND TREATMENT PLANNING NOTE  Outpatient  DIAGNOSIS:     ICD-9-CM ICD-10-CM   1. Diffuse large B cell lymphoma 202.80 C83.30     NARRATIVE:  The patient was brought to the [facility].  Identity was confirmed.  All relevant records and images related to the planned course of therapy were reviewed.  The patient freely provided informed written consent to proceed with treatment after reviewing the details related to the planned course of therapy. The consent form was witnessed and verified by the simulation staff.    Then, the patient was set-up in a stable reproducible  supine position, froglegged,  for radiation therapy.  CT images were obtained.  Surface markings were placed.  The CT images were loaded into the planning software.    TREATMENT PLANNING NOTE: Treatment planning then occurred.  The radiation prescription was entered and confirmed.    A total of 2 medically necessary complex treatment devices were fabricated and supervised by me - vaclock and electron cut out. I have requested : electron Best boy.     The patient will receive 36 Gy in 20 fractions to the left groin with electrons.  -----------------------------------  Eppie Gibson, MD  This document serves as a record of services personally performed by Eppie Gibson, MD. It was created on his behalf by Derek Mound, a trained medical scribe. The creation of this record is based on the scribe's personal observations and the provider's statements to them. This document has been checked and approved by the attending provider.

## 2015-02-23 ENCOUNTER — Ambulatory Visit
Admission: RE | Admit: 2015-02-23 | Discharge: 2015-02-23 | Disposition: A | Discharge status: Home / Self Care | Payer: BLUE CROSS/BLUE SHIELD | Source: Ambulatory Visit | Attending: Radiation Oncology | Admitting: Radiation Oncology

## 2015-02-23 DIAGNOSIS — Z510 Encounter for antineoplastic radiation therapy: Secondary | ICD-10-CM | POA: Diagnosis not present

## 2015-02-24 ENCOUNTER — Ambulatory Visit
Admission: RE | Admit: 2015-02-24 | Discharge: 2015-02-24 | Disposition: A | Discharge status: Home / Self Care | Payer: BLUE CROSS/BLUE SHIELD | Source: Ambulatory Visit | Attending: Radiation Oncology | Admitting: Radiation Oncology

## 2015-02-24 DIAGNOSIS — Z510 Encounter for antineoplastic radiation therapy: Secondary | ICD-10-CM | POA: Diagnosis not present

## 2015-02-27 ENCOUNTER — Encounter: Payer: Self-pay | Admitting: *Deleted

## 2015-02-27 ENCOUNTER — Ambulatory Visit
Admission: RE | Admit: 2015-02-27 | Discharge: 2015-02-27 | Disposition: A | Discharge status: Home / Self Care | Payer: BLUE CROSS/BLUE SHIELD | Source: Ambulatory Visit | Attending: Radiation Oncology | Admitting: Radiation Oncology

## 2015-02-27 DIAGNOSIS — Z510 Encounter for antineoplastic radiation therapy: Secondary | ICD-10-CM | POA: Diagnosis not present

## 2015-02-27 NOTE — Progress Notes (Addendum)
[place] Psychosocial Distress Screening Clinical Social Work  Clinical Social Work was referred by distress screening protocol.  The patient scored a 8 on the Psychosocial Distress Thermometer which indicates severe distress. Clinical Social Worker attempted to contact to assess for distress and other psychosocial needs.   ONCBCN DISTRESS SCREENING 02/15/2015  Screening Type Initial Screening  Distress experienced in past week (1-10) 8  Practical problem type Housing;Insurance;Work/school;Transportation;Food  Family Problem type Partner  Emotional problem type Nervousness/Anxiety;Adjusting to illness;Adjusting to appearance changes  Information Concerns Type Lack of info about diagnosis  Physical Problem type Pain;Sleep/insomnia;Changes in urination  Physician notified of physical symptoms Yes  Referral to clinical psychology Yes    Clinical Social Worker follow up needed: Yes.    If yes, follow up plan:  CSW left voicemail for patient to return call when convenient.  Polo Riley, MSW, LCSW, [facility] Clinical Social Worker [facility] [phone]        02/27/15 1248  Patient returned CSW phone call.  Patient shared she is adjusting well to radiation treatment, but her main concern is working while in treatment and finances.  Patient is already connected with financial advocates.  CSW shared two resources with patient and sent links via email.  Below is CSW email:  Glenard Haring,  Feel free to check out [email or web address]  There is so much information packed into this website.  You may want to take some time to browse their resources.  Below are the instructions and link for the Cancer Care fund: http://www.cancercare.org/financial  Steps for Applying 1. Call 800-813-HOPE [phone]) and speak with a CancerCare social worker to complete a brief interview. We can be reached from 9 a.m.-7 p.m. (ET) Monday through Thursday, and 9 a.m.-5 p.m. (ET) on  Friday. 2. If you are eligible to apply, we will: mail you an individualized bar coded application request documentation to verify your income (view acceptable proof of income documents) 3. You must submit a completed application. Please: print clearly-illegible applications cannot be processed fill in each blank space in the application. Use "no", "none", or "0" as appropriate-do not leave any blank responses have a medical oncology health care provider complete all sections of the Medical Information Section and provide a signature and date. You cannot complete this section. make sure you use the correct CancerCare mailing address and fax number listed on the application    Polo Riley, MSW, LCSW, OSW-C Clinical Scientist, research (medical) [facility] [place] [facility] [place] Phone:  [phone] Fax:  [phone]  Polo Riley, MSW, LCSW, OSW-C Clinical Social Worker [place] [phone]

## 2015-02-28 ENCOUNTER — Ambulatory Visit
Admission: RE | Admit: 2015-02-28 | Discharge: 2015-02-28 | Disposition: A | Discharge status: Home / Self Care | Payer: BLUE CROSS/BLUE SHIELD | Source: Ambulatory Visit | Attending: Radiation Oncology | Admitting: Radiation Oncology

## 2015-02-28 DIAGNOSIS — C8330 Diffuse large B-cell lymphoma, unspecified site: Secondary | ICD-10-CM

## 2015-02-28 DIAGNOSIS — Z510 Encounter for antineoplastic radiation therapy: Secondary | ICD-10-CM | POA: Diagnosis not present

## 2015-02-28 NOTE — Progress Notes (Signed)
    Weekly Management Note:  Outpatient    ICD-9-CM ICD-10-CM   1. Diffuse large B cell lymphoma 202.80 C83.30     Current Dose:  7.2 Gy  Projected Dose: 36 Gy   Narrative:  The patient presents for routine under treatment assessment.  CBCT/MVCT images/Port film x-rays were reviewed.  The chart was checked. Doing well but tired. Teaching yoga.  Physical Findings:  Wt Readings from Last 3 Encounters:  02/15/15 153 lb 1.6 oz (69.446 kg)  01/31/15 153 lb 12.8 oz (69.763 kg)  01/27/15 152 lb 9.6 oz (69.219 kg)    vitals were not taken for this visit. no skin irritation in left groin thus far  CBC    Component Value Date/Time   WBC 7.4 01/30/2015 0911   WBC 6.7 11/16/2014 0949   RBC 3.89 01/30/2015 0911   RBC 4.54 11/16/2014 0949   HGB 12.7 01/30/2015 0911   HGB 14.7 11/16/2014 0949   HCT 37.8 01/30/2015 0911   HCT 43.0 11/16/2014 0949   PLT 339 01/30/2015 0911   PLT 295 11/16/2014 0949   MCV 97.2 01/30/2015 0911   MCV 94.7 11/16/2014 0949   MCH 32.5 01/30/2015 0911   MCH 32.4 11/16/2014 0949   MCHC 33.4 01/30/2015 0911   MCHC 34.2 11/16/2014 0949   RDW 17.7* 01/30/2015 0911   RDW 13.7 11/16/2014 0949   LYMPHSABS 1.0 01/30/2015 0911   MONOABS 0.4 01/30/2015 0911   EOSABS 0.0 01/30/2015 0911   BASOSABS 0.0 01/30/2015 0911     CMP     Component Value Date/Time   NA 142 01/30/2015 0911   NA 141 11/16/2014 0949   K 4.3 01/30/2015 0911   K 4.5 11/16/2014 0949   CL 103 11/16/2014 0949   CO2 22 01/30/2015 0911   CO2 27 11/16/2014 0949   GLUCOSE 103 01/30/2015 0911   GLUCOSE 101* 11/16/2014 0949   BUN 14.9 01/30/2015 0911   BUN 12 11/16/2014 0949   CREATININE 0.7 01/30/2015 0911   CREATININE 0.87 11/16/2014 0949   CALCIUM 9.8 01/30/2015 0911   CALCIUM 10.0 11/16/2014 0949   PROT 7.0 01/30/2015 0911   PROT 6.8 11/16/2014 0949   ALBUMIN 3.9 01/30/2015 0911   ALBUMIN 4.0 11/16/2014 0949   AST 21 01/30/2015 0911   AST 23 11/16/2014 0949   ALT 29 01/30/2015  0911   ALT 23 11/16/2014 0949   ALKPHOS 68 01/30/2015 0911   ALKPHOS 40 11/16/2014 0949   BILITOT 0.29 01/30/2015 0911   BILITOT 0.6 11/16/2014 0949   GFRNONAA 76* 11/16/2014 0949   GFRAA 88* 11/16/2014 0949     Impression:  The patient is tolerating radiotherapy.   Plan:  Continue radiotherapy as planned.  -----------------------------------  Eppie Gibson, MD

## 2015-03-01 ENCOUNTER — Ambulatory Visit
Admission: RE | Admit: 2015-03-01 | Discharge: 2015-03-01 | Disposition: A | Discharge status: Home / Self Care | Payer: BLUE CROSS/BLUE SHIELD | Source: Ambulatory Visit | Attending: Radiation Oncology | Admitting: Radiation Oncology

## 2015-03-01 DIAGNOSIS — Z510 Encounter for antineoplastic radiation therapy: Secondary | ICD-10-CM | POA: Diagnosis not present

## 2015-03-02 ENCOUNTER — Ambulatory Visit
Admission: RE | Admit: 2015-03-02 | Discharge: 2015-03-02 | Disposition: A | Discharge status: Home / Self Care | Payer: BLUE CROSS/BLUE SHIELD | Source: Ambulatory Visit | Attending: Radiation Oncology | Admitting: Radiation Oncology

## 2015-03-02 DIAGNOSIS — Z510 Encounter for antineoplastic radiation therapy: Secondary | ICD-10-CM | POA: Diagnosis not present

## 2015-03-03 ENCOUNTER — Ambulatory Visit
Admission: RE | Admit: 2015-03-03 | Discharge: 2015-03-03 | Disposition: A | Discharge status: Home / Self Care | Payer: BLUE CROSS/BLUE SHIELD | Source: Ambulatory Visit | Attending: Radiation Oncology | Admitting: Radiation Oncology

## 2015-03-03 DIAGNOSIS — Z510 Encounter for antineoplastic radiation therapy: Secondary | ICD-10-CM | POA: Diagnosis not present

## 2015-03-06 ENCOUNTER — Ambulatory Visit
Admission: RE | Admit: 2015-03-06 | Discharge: 2015-03-06 | Disposition: A | Discharge status: Home / Self Care | Payer: BLUE CROSS/BLUE SHIELD | Source: Ambulatory Visit | Attending: Radiation Oncology | Admitting: Radiation Oncology

## 2015-03-06 ENCOUNTER — Ambulatory Visit (HOSPITAL_BASED_OUTPATIENT_CLINIC_OR_DEPARTMENT_OTHER): Payer: BLUE CROSS/BLUE SHIELD

## 2015-03-06 ENCOUNTER — Ambulatory Visit (HOSPITAL_BASED_OUTPATIENT_CLINIC_OR_DEPARTMENT_OTHER)
Admission: RE | Admit: 2015-03-06 | Discharge: 2015-03-06 | Disposition: A | Discharge status: Home / Self Care | Payer: BLUE CROSS/BLUE SHIELD | Source: Ambulatory Visit | Attending: Radiation Oncology | Admitting: Radiation Oncology

## 2015-03-06 VITALS — BP 120/77 | HR 65 | Temp 98.3°F | Resp 12 | Wt 152.8 lb

## 2015-03-06 DIAGNOSIS — C8330 Diffuse large B-cell lymphoma, unspecified site: Secondary | ICD-10-CM | POA: Diagnosis not present

## 2015-03-06 DIAGNOSIS — Z452 Encounter for adjustment and management of vascular access device: Secondary | ICD-10-CM | POA: Diagnosis not present

## 2015-03-06 DIAGNOSIS — Z510 Encounter for antineoplastic radiation therapy: Secondary | ICD-10-CM | POA: Diagnosis not present

## 2015-03-06 LAB — CBC WITH DIFFERENTIAL/PLATELET
BASO%: 1.5 % (ref 0.0–2.0)
Basophils Absolute: 0.1 10*3/uL (ref 0.0–0.1)
EOS ABS: 0.3 10*3/uL (ref 0.0–0.5)
EOS%: 5.9 % (ref 0.0–7.0)
HCT: 39.5 % (ref 34.8–46.6)
HGB: 13.7 g/dL (ref 11.6–15.9)
LYMPH%: 27.7 % (ref 14.0–49.7)
MCH: 33.8 pg (ref 25.1–34.0)
MCHC: 34.7 g/dL (ref 31.5–36.0)
MCV: 97.5 fL (ref 79.5–101.0)
MONO#: 0.4 10*3/uL (ref 0.1–0.9)
MONO%: 7.7 % (ref 0.0–14.0)
NEUT%: 57.2 % (ref 38.4–76.8)
NEUTROS ABS: 3.1 10*3/uL (ref 1.5–6.5)
PLATELETS: 361 10*3/uL (ref 145–400)
RBC: 4.05 10*6/uL (ref 3.70–5.45)
RDW: 15.5 % — ABNORMAL HIGH (ref 11.2–14.5)
WBC: 5.5 10*3/uL (ref 3.9–10.3)
lymph#: 1.5 10*3/uL (ref 0.9–3.3)

## 2015-03-06 MED ORDER — SODIUM CHLORIDE 0.9 % IJ SOLN
10.0000 mL | INTRAMUSCULAR | Status: DC | PRN
  Administered 2015-03-06: 10 mL via INTRAVENOUS
  Filled 2015-03-06: qty 10

## 2015-03-06 MED ORDER — HEPARIN SOD (PORK) LOCK FLUSH 100 UNIT/ML IV SOLN
500.0000 [IU] | Freq: Once | INTRAVENOUS | Status: AC
  Administered 2015-03-06: 500 [IU] via INTRAVENOUS
  Filled 2015-03-06: qty 5

## 2015-03-06 MED ORDER — RADIAPLEXRX EX GEL
Freq: Once | CUTANEOUS | Status: AC
  Administered 2015-03-06: 14:00:00 via TOPICAL

## 2015-03-06 NOTE — Addendum Note (Signed)
Encounter addended by: Jenene Slicker, RN on: 03/06/2015 12:13 PM<BR>     Documentation filed: Notes Section

## 2015-03-06 NOTE — Progress Notes (Signed)
    Weekly Management Note:  Outpatient    ICD-9-CM ICD-10-CM   1. Diffuse large B cell lymphoma 202.80 C83.30 CBC with Differential    Current Dose: 14.4 Gy  Projected Dose: 36 Gy   Narrative:  The patient presents for routine under treatment assessment.  CBCT/MVCT images/Port film x-rays were reviewed.  The chart was checked. Doing well but more tired. Urinary frequency but better than during chemotherapy.  No dysuria or chills/fever.  L Groin sore, Skin is a little irritated. Teaching yoga.  Physical Findings:  Wt Readings from Last 3 Encounters:  03/06/15 152 lb 12.8 oz (69.31 kg)  02/15/15 153 lb 1.6 oz (69.446 kg)  01/31/15 153 lb 12.8 oz (69.763 kg)    weight is 152 lb 12.8 oz (69.31 kg). Her oral temperature is 98.3 F (36.8 C). Her blood pressure is 120/77 and her pulse is 65. Her respiration is 12 and oxygen saturation is 97%.  mild erythema/ skin irritation in left groin thus far  CBC    Component Value Date/Time   WBC 5.5 03/06/2015 1037   WBC 6.7 11/16/2014 0949   RBC 4.05 03/06/2015 1037   RBC 4.54 11/16/2014 0949   HGB 13.7 03/06/2015 1037   HGB 14.7 11/16/2014 0949   HCT 39.5 03/06/2015 1037   HCT 43.0 11/16/2014 0949   PLT 361 03/06/2015 1037   PLT 295 11/16/2014 0949   MCV 97.5 03/06/2015 1037   MCV 94.7 11/16/2014 0949   MCH 33.8 03/06/2015 1037   MCH 32.4 11/16/2014 0949   MCHC 34.7 03/06/2015 1037   MCHC 34.2 11/16/2014 0949   RDW 15.5* 03/06/2015 1037   RDW 13.7 11/16/2014 0949   LYMPHSABS 1.5 03/06/2015 1037   MONOABS 0.4 03/06/2015 1037   EOSABS 0.3 03/06/2015 1037   BASOSABS 0.1 03/06/2015 1037     CMP     Component Value Date/Time   NA 142 01/30/2015 0911   NA 141 11/16/2014 0949   K 4.3 01/30/2015 0911   K 4.5 11/16/2014 0949   CL 103 11/16/2014 0949   CO2 22 01/30/2015 0911   CO2 27 11/16/2014 0949   GLUCOSE 103 01/30/2015 0911   GLUCOSE 101* 11/16/2014 0949   BUN 14.9 01/30/2015 0911   BUN 12 11/16/2014 0949   CREATININE  0.7 01/30/2015 0911   CREATININE 0.87 11/16/2014 0949   CALCIUM 9.8 01/30/2015 0911   CALCIUM 10.0 11/16/2014 0949   PROT 7.0 01/30/2015 0911   PROT 6.8 11/16/2014 0949   ALBUMIN 3.9 01/30/2015 0911   ALBUMIN 4.0 11/16/2014 0949   AST 21 01/30/2015 0911   AST 23 11/16/2014 0949   ALT 29 01/30/2015 0911   ALT 23 11/16/2014 0949   ALKPHOS 68 01/30/2015 0911   ALKPHOS 40 11/16/2014 0949   BILITOT 0.29 01/30/2015 0911   BILITOT 0.6 11/16/2014 0949   GFRNONAA 76* 11/16/2014 0949   GFRAA 88* 11/16/2014 0949     Impression:  The patient is tolerating radiotherapy.   Plan:  Continue radiotherapy as planned. Pt requested CBC today due to fatigue. It is unremarkable. -----------------------------------  Eppie Gibson, MD

## 2015-03-06 NOTE — Progress Notes (Signed)
She rates her pain as a 6 on a scale of 0-10. intermittent, sharp and tingling over bladder/pelvic/left groin. Pt complains of fatigue. Reports urinary frequency.  Pt states they urinate 1 - 2 times per night.  Denies abnormal bowel patterns, a bowel movement every day. Pt reports skin over left groin is red without open areas, tender.  BP 120/77 mmHg  Pulse 65  Temp(Src) 98.3 F (36.8 C) (Oral)  Resp 12  Wt 152 lb 12.8 oz (69.31 kg)  SpO2 97%

## 2015-03-06 NOTE — Patient Instructions (Signed)

## 2015-03-06 NOTE — Progress Notes (Signed)
Pt here for patient teaching.  Pt given Radiation and You booklet and skin care instructions. Reviewed areas of pertinence such as diarrhea, fatigue, hair loss, sexual and fertility changes, skin changes and urinary and bladder changes . Pt able to give teach back of to pat skin and use unscented/gentle soap,apply Radiaplex bid and avoid applying anything to skin within 4 hours of treatment. Pt demonstrated understanding of information given and will contact nursing with any questions or concerns.

## 2015-03-06 NOTE — Addendum Note (Signed)
Encounter addended by: Jenene Slicker, RN on: 03/06/2015  1:44 PM<BR>     Documentation filed: [employer], Inpatient MAR, Orders

## 2015-03-07 ENCOUNTER — Ambulatory Visit
Admission: RE | Admit: 2015-03-07 | Discharge: 2015-03-07 | Disposition: A | Discharge status: Home / Self Care | Payer: BLUE CROSS/BLUE SHIELD | Source: Ambulatory Visit | Attending: Radiation Oncology | Admitting: Radiation Oncology

## 2015-03-07 DIAGNOSIS — Z510 Encounter for antineoplastic radiation therapy: Secondary | ICD-10-CM | POA: Diagnosis not present

## 2015-03-08 ENCOUNTER — Ambulatory Visit
Admission: RE | Admit: 2015-03-08 | Discharge: 2015-03-08 | Disposition: A | Discharge status: Home / Self Care | Payer: BLUE CROSS/BLUE SHIELD | Source: Ambulatory Visit | Attending: Radiation Oncology | Admitting: Radiation Oncology

## 2015-03-08 ENCOUNTER — Institutional Professional Consult (permissible substitution): Payer: Self-pay | Admitting: Radiation Oncology

## 2015-03-08 DIAGNOSIS — Z510 Encounter for antineoplastic radiation therapy: Secondary | ICD-10-CM | POA: Diagnosis not present

## 2015-03-09 ENCOUNTER — Ambulatory Visit
Admission: RE | Admit: 2015-03-09 | Discharge: 2015-03-09 | Disposition: A | Discharge status: Home / Self Care | Payer: BLUE CROSS/BLUE SHIELD | Source: Ambulatory Visit | Attending: Radiation Oncology | Admitting: Radiation Oncology

## 2015-03-09 DIAGNOSIS — Z510 Encounter for antineoplastic radiation therapy: Secondary | ICD-10-CM | POA: Diagnosis not present

## 2015-03-10 ENCOUNTER — Ambulatory Visit
Admission: RE | Admit: 2015-03-10 | Discharge: 2015-03-10 | Disposition: A | Discharge status: Home / Self Care | Payer: BLUE CROSS/BLUE SHIELD | Source: Ambulatory Visit | Attending: Radiation Oncology | Admitting: Radiation Oncology

## 2015-03-10 DIAGNOSIS — Z510 Encounter for antineoplastic radiation therapy: Secondary | ICD-10-CM | POA: Diagnosis not present

## 2015-03-13 ENCOUNTER — Ambulatory Visit
Admission: RE | Admit: 2015-03-13 | Discharge: 2015-03-13 | Disposition: A | Discharge status: Home / Self Care | Payer: BLUE CROSS/BLUE SHIELD | Source: Ambulatory Visit | Attending: Radiation Oncology | Admitting: Radiation Oncology

## 2015-03-13 VITALS — BP 114/78 | HR 64 | Temp 98.9°F | Resp 12 | Wt 151.9 lb

## 2015-03-13 DIAGNOSIS — C8330 Diffuse large B-cell lymphoma, unspecified site: Secondary | ICD-10-CM

## 2015-03-13 DIAGNOSIS — Z510 Encounter for antineoplastic radiation therapy: Secondary | ICD-10-CM | POA: Diagnosis not present

## 2015-03-13 NOTE — Progress Notes (Signed)
Weekly Management Note:  Site: Left groin Current Dose:  2340  cGy Projected Dose: 3600  cGy  Narrative: The patient is seen today for routine under treatment assessment. CBCT/MVCT images/port films were reviewed. The chart was reviewed.   She is without new complaints today except for an occasional sharp discomfort along her left groin and rash along her forearms.  The rash is not pruritic.  She worked in her yard over the weekend.  She was wearing gloves, and her hands are spared.  Physical Examination:  Filed Vitals:   03/13/15 0931  BP: 114/78  Pulse: 64  Temp: 98.9 F (37.2 C)  Resp: 12  .  Weight: 151 lb 14.4 oz (68.901 kg).  She does have small raised lesions along her forearms felt to most likely represent contact dermatitis.  There is slight erythema along the left groin but no areas of desquamation.  I barely feel a residual lymph node which is not pathologically enlarged.  Impression: Tolerating radiation therapy well.  Plan: Continue radiation therapy as planned.

## 2015-03-13 NOTE — Progress Notes (Signed)
She rates her pain as a 3 on a scale of 0-10. intermittent, sharp and burning over groin and bladder area.  Reports urinary frequency and urinary incontinence.  Pt states they urinate 0 - 1 times per night.  Pt reports vaginal discharge scant and clear Pt reports a bowel movement every day.  She reports mild erythema to left groin area.  No open areas.  She continues to use Radiaplex bid. BP 114/78 mmHg  Pulse 64  Temp(Src) 98.9 F (37.2 C) (Oral)  Resp 12  Wt 151 lb 14.4 oz (68.901 kg)  SpO2 98%

## 2015-03-14 ENCOUNTER — Ambulatory Visit
Admission: RE | Admit: 2015-03-14 | Discharge: 2015-03-14 | Disposition: A | Discharge status: Home / Self Care | Payer: BLUE CROSS/BLUE SHIELD | Source: Ambulatory Visit | Attending: Radiation Oncology | Admitting: Radiation Oncology

## 2015-03-14 DIAGNOSIS — Z510 Encounter for antineoplastic radiation therapy: Secondary | ICD-10-CM | POA: Diagnosis not present

## 2015-03-15 ENCOUNTER — Ambulatory Visit
Admission: RE | Admit: 2015-03-15 | Discharge: 2015-03-15 | Disposition: A | Discharge status: Home / Self Care | Payer: BLUE CROSS/BLUE SHIELD | Source: Ambulatory Visit | Attending: Radiation Oncology | Admitting: Radiation Oncology

## 2015-03-15 DIAGNOSIS — Z510 Encounter for antineoplastic radiation therapy: Secondary | ICD-10-CM | POA: Diagnosis not present

## 2015-03-16 ENCOUNTER — Ambulatory Visit
Admission: RE | Admit: 2015-03-16 | Discharge: 2015-03-16 | Disposition: A | Discharge status: Home / Self Care | Payer: BLUE CROSS/BLUE SHIELD | Source: Ambulatory Visit | Attending: Radiation Oncology | Admitting: Radiation Oncology

## 2015-03-16 DIAGNOSIS — Z510 Encounter for antineoplastic radiation therapy: Secondary | ICD-10-CM | POA: Diagnosis not present

## 2015-03-17 ENCOUNTER — Ambulatory Visit
Admission: RE | Admit: 2015-03-17 | Discharge: 2015-03-17 | Disposition: A | Discharge status: Home / Self Care | Payer: BLUE CROSS/BLUE SHIELD | Source: Ambulatory Visit | Attending: Radiation Oncology | Admitting: Radiation Oncology

## 2015-03-17 DIAGNOSIS — Z510 Encounter for antineoplastic radiation therapy: Secondary | ICD-10-CM | POA: Diagnosis not present

## 2015-03-20 ENCOUNTER — Ambulatory Visit
Admission: RE | Admit: 2015-03-20 | Discharge: 2015-03-20 | Disposition: A | Discharge status: Home / Self Care | Payer: BLUE CROSS/BLUE SHIELD | Source: Ambulatory Visit | Attending: Radiation Oncology | Admitting: Radiation Oncology

## 2015-03-20 VITALS — BP 119/81 | HR 61 | Temp 98.3°F | Resp 12 | Wt 152.0 lb

## 2015-03-20 DIAGNOSIS — C8330 Diffuse large B-cell lymphoma, unspecified site: Secondary | ICD-10-CM

## 2015-03-20 DIAGNOSIS — Z510 Encounter for antineoplastic radiation therapy: Secondary | ICD-10-CM | POA: Diagnosis not present

## 2015-03-20 NOTE — Progress Notes (Addendum)
    Weekly Management Note:  Outpatient    ICD-9-CM ICD-10-CM   1. 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  CBCT/MVCT images/Port film x-rays were reviewed.  The chart was checked. No new complaints, other that mild increase in vaginal discharge which doesn't bother her, as well as a growing mole on neck and plugged hair follicle in left groin that has drained. Physical Findings:  Wt Readings from Last 3 Encounters:  03/20/15 152 lb (68.947 kg)  02/15/15 153 lb 1.6 oz (69.446 kg)  01/31/15 153 lb 12.8 oz (69.763 kg)    weight is 152 lb (68.947 kg). Her oral temperature is 98.3 F (36.8 C). Her blood pressure is 119/81 and her pulse is 61. Her respiration is 12 and oxygen saturation is 100%.  erythema in left groin without any obvious palpable mass. Skin intact. Seborrheic keratosis, right neck  CBC    Component Value Date/Time   WBC 5.5 03/06/2015 1037   WBC 6.7 11/16/2014 0949   RBC 4.05 03/06/2015 1037   RBC 4.54 11/16/2014 0949   HGB 13.7 03/06/2015 1037   HGB 14.7 11/16/2014 0949   HCT 39.5 03/06/2015 1037   HCT 43.0 11/16/2014 0949   PLT 361 03/06/2015 1037   PLT 295 11/16/2014 0949   MCV 97.5 03/06/2015 1037   MCV 94.7 11/16/2014 0949   MCH 33.8 03/06/2015 1037   MCH 32.4 11/16/2014 0949   MCHC 34.7 03/06/2015 1037   MCHC 34.2 11/16/2014 0949   RDW 15.5* 03/06/2015 1037   RDW 13.7 11/16/2014 0949   LYMPHSABS 1.5 03/06/2015 1037   MONOABS 0.4 03/06/2015 1037   EOSABS 0.3 03/06/2015 1037   BASOSABS 0.1 03/06/2015 1037     CMP     Component Value Date/Time   NA 142 01/30/2015 0911   NA 141 11/16/2014 0949   K 4.3 01/30/2015 0911   K 4.5 11/16/2014 0949   CL 103 11/16/2014 0949   CO2 22 01/30/2015 0911   CO2 27 11/16/2014 0949   GLUCOSE 103 01/30/2015 0911   GLUCOSE 101* 11/16/2014 0949   BUN 14.9 01/30/2015 0911   BUN 12  11/16/2014 0949   CREATININE 0.7 01/30/2015 0911   CREATININE 0.87 11/16/2014 0949   CALCIUM 9.8 01/30/2015 0911   CALCIUM 10.0 11/16/2014 0949   PROT 7.0 01/30/2015 0911   PROT 6.8 11/16/2014 0949   ALBUMIN 3.9 01/30/2015 0911   ALBUMIN 4.0 11/16/2014 0949   AST 21 01/30/2015 0911   AST 23 11/16/2014 0949   ALT 29 01/30/2015 0911   ALT 23 11/16/2014 0949   ALKPHOS 68 01/30/2015 0911   ALKPHOS 40 11/16/2014 0949   BILITOT 0.29 01/30/2015 0911   BILITOT 0.6 11/16/2014 0949   GFRNONAA 76* 11/16/2014 0949   GFRAA 88* 11/16/2014 0949     Impression:  The patient is tolerating radiotherapy.   Plan:  Continue radiotherapy as planned. She will contact dermatologist about bothersome mole on neck F/u in 84mo  -----------------------------------  Eppie Gibson, MD

## 2015-03-20 NOTE — Progress Notes (Signed)
She rates her pain as a 3 on a scale of 0-10. intermittent and burning over buttocks and left groin. Pt complains of fatigue .  Reports urinary frequency and urinary incontinence.  Pt states they urinate 0 - 1 times per night.  Pt reports vaginal discharge brown, thin, odorless and moderate amount Pt reports soft bowel movement everyday. Report skin over left groin red irritation without open areas.  She reports she has a "boil" in the treatment area that is bothersome.  She continues to apply the Radiaplex as directed.  BP 119/81 mmHg  Pulse 61  Temp(Src) 98.3 F (36.8 C) (Oral)  Resp 12  Wt 152 lb (68.947 kg)  SpO2 100%

## 2015-03-21 ENCOUNTER — Ambulatory Visit
Admission: RE | Admit: 2015-03-21 | Discharge: 2015-03-21 | Disposition: A | Discharge status: Home / Self Care | Payer: BLUE CROSS/BLUE SHIELD | Source: Ambulatory Visit | Attending: Radiation Oncology | Admitting: Radiation Oncology

## 2015-03-21 DIAGNOSIS — Z510 Encounter for antineoplastic radiation therapy: Secondary | ICD-10-CM | POA: Diagnosis not present

## 2015-03-22 ENCOUNTER — Encounter: Payer: Self-pay | Admitting: Radiation Oncology

## 2015-03-22 ENCOUNTER — Ambulatory Visit
Admission: RE | Admit: 2015-03-22 | Discharge: 2015-03-22 | Disposition: A | Discharge status: Home / Self Care | Payer: BLUE CROSS/BLUE SHIELD | Source: Ambulatory Visit | Attending: Radiation Oncology | Admitting: Radiation Oncology

## 2015-03-22 DIAGNOSIS — Z510 Encounter for antineoplastic radiation therapy: Secondary | ICD-10-CM | POA: Diagnosis not present

## 2015-03-22 NOTE — Progress Notes (Signed)
  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      Plan: The patient has completed radiation treatment. The patient will return to radiation oncology clinic for routine followup in one month. I advised them to call or return sooner if they have any questions or concerns related to their recovery or treatment.  -----------------------------------  Eppie Gibson, MD

## 2015-03-27 ENCOUNTER — Other Ambulatory Visit: Payer: Self-pay | Admitting: Hematology and Oncology

## 2015-03-27 DIAGNOSIS — C8330 Diffuse large B-cell lymphoma, unspecified site: Secondary | ICD-10-CM

## 2015-03-28 ENCOUNTER — Telehealth: Payer: Self-pay | Admitting: Hematology and Oncology

## 2015-03-28 NOTE — Telephone Encounter (Signed)
Lft msg for pt confirming labs/flush/ov per 05/30 POF.... Mailed schedule to pt and sent msg through my chart... KJ

## 2015-03-29 ENCOUNTER — Telehealth: Payer: Self-pay | Admitting: Hematology and Oncology

## 2015-03-29 NOTE — Telephone Encounter (Signed)
returned call and lvm for pt to call back to r/s flush

## 2015-03-29 NOTE — Telephone Encounter (Signed)
pt called to r/s lab done....pt ok and aware of d.t °

## 2015-04-21 ENCOUNTER — Other Ambulatory Visit: Payer: Self-pay | Admitting: Hematology and Oncology

## 2015-04-21 ENCOUNTER — Ambulatory Visit (HOSPITAL_BASED_OUTPATIENT_CLINIC_OR_DEPARTMENT_OTHER): Payer: BLUE CROSS/BLUE SHIELD

## 2015-04-21 VITALS — BP 130/79 | HR 67 | Temp 97.6°F | Resp 16

## 2015-04-21 DIAGNOSIS — C8330 Diffuse large B-cell lymphoma, unspecified site: Secondary | ICD-10-CM

## 2015-04-21 DIAGNOSIS — Z95828 Presence of other vascular implants and grafts: Secondary | ICD-10-CM

## 2015-04-21 DIAGNOSIS — Z452 Encounter for adjustment and management of vascular access device: Secondary | ICD-10-CM

## 2015-04-21 MED ORDER — HEPARIN SOD (PORK) LOCK FLUSH 100 UNIT/ML IV SOLN
500.0000 [IU] | Freq: Once | INTRAVENOUS | Status: AC
  Administered 2015-04-21: 500 [IU] via INTRAVENOUS
  Filled 2015-04-21: qty 5

## 2015-04-21 MED ORDER — SODIUM CHLORIDE 0.9 % IJ SOLN
10.0000 mL | INTRAMUSCULAR | Status: DC | PRN
  Administered 2015-04-21: 10 mL via INTRAVENOUS
  Filled 2015-04-21: qty 10

## 2015-04-21 NOTE — Patient Instructions (Signed)

## 2015-05-17 ENCOUNTER — Ambulatory Visit
Admission: RE | Admit: 2015-05-17 | Discharge: 2015-05-17 | Disposition: A | Discharge status: Home / Self Care | Payer: BLUE CROSS/BLUE SHIELD | Source: Ambulatory Visit | Attending: Radiation Oncology | Admitting: Radiation Oncology

## 2015-05-17 ENCOUNTER — Encounter: Payer: Self-pay | Admitting: Radiation Oncology

## 2015-05-17 VITALS — BP 127/75 | HR 58 | Temp 98.5°F | Resp 12 | Wt 152.9 lb

## 2015-05-17 DIAGNOSIS — R5383 Other fatigue: Secondary | ICD-10-CM

## 2015-05-17 DIAGNOSIS — C8330 Diffuse large B-cell lymphoma, unspecified site: Secondary | ICD-10-CM

## 2015-05-17 NOTE — Progress Notes (Signed)
PAIN: She is currently in no pain.  URINARY: Denies abnormal urinary symptoms.  Pt states they urinate 0 - 1 times per night.  BOWEL: Pt reports Nausea. OTHER: Pt complains of extreme fatigue in mid afternoon. Reports "day sweats" hot flashes.  "Boils" to left groin, which are painful.    BP 127/75 mmHg  Pulse 58  Temp(Src) 98.5 F (36.9 C) (Oral)  Resp 12  Wt 152 lb 14.4 oz (69.355 kg)  SpO2 100% Wt Readings from Last 3 Encounters:  05/17/15 152 lb 14.4 oz (69.355 kg)  03/20/15 152 lb (68.947 kg)  03/13/15 151 lb 14.4 oz (68.901 kg)

## 2015-05-17 NOTE — Progress Notes (Signed)
Radiation Oncology         [phone]) [phone] ________________________________  Name: Alexandra Allen MRN: [id number]  Date: 05/17/2015  DOB: 1961/05/26  Follow-Up Visit Note  Outpatient  CC: WEBB, Valla Leaver, MD  Heath Lark, MD  Diagnosis and Prior Radiotherapy:    ICD-9-CM ICD-10-CM   1. 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  She is currently in no pain. Denies abnormal urinary symptoms. Patient states she urinates 0-1 times per night. Patient reports nausea. Patient complains of extreme fatigue in mid afternoon. Reports " day sweats" hot flashes. "Boils" to left groin, which are painful  Very tired. Sleeping 5 hrs/ night and working a lot.            ALLERGIES:  is allergic to adhesive; influenza vaccines; prednisone; and sulfa antibiotics.  Meds: Current Outpatient Prescriptions  Medication Sig Dispense Refill  . Glucosamine-Chondroit-Vit C-Mn (GLUCOSAMINE CHONDR 1500 COMPLX PO) Take 1 tablet by mouth daily.     Marland Kitchen HYDROcodone-acetaminophen (NORCO) 7.5-325 MG per tablet Take 7.5 tablets by mouth.  0  . ibuprofen (ADVIL) 200 MG tablet Take 200 mg by mouth every 6 (six) hours as needed.    . Multiple Vitamins-Minerals (MULTIVITAMIN PO) Take by mouth daily.    . traMADol (ULTRAM) 50 MG tablet Take 1 tablet (50 mg total) by mouth every 6 (six) hours as needed for moderate pain. (Patient not taking: Reported on 03/06/2015) 60 tablet 0  . UNABLE TO FIND Take 1 tablet by mouth at bedtime. Med Name: Supplement "DGL"     No current facility-administered medications for this encounter.    Physical Findings: The patient is in no acute distress. Patient is alert and oriented.  weight is 152 lb 14.4 oz (69.355 kg). Her  oral temperature is 98.5 F (36.9 C). Her blood pressure is 127/75 and her pulse is 58. Her respiration is 12 and oxygen saturation is 100%. .     There is a small pea sized mass or area of thickening in left groin (not pathologically enlarged); some subcutaneous erythematous nodules in the left pubic region consistent w/ folliculitis   Lab Findings: Lab Results  Component Value Date   WBC 5.5 03/06/2015   HGB 13.7 03/06/2015   HCT 39.5 03/06/2015   MCV 97.5 03/06/2015   PLT 361 03/06/2015   No results found for: TSH  Radiographic Findings: No results found.  Impression/Plan:  She has healed relatively well from radiotherapy. She has a history of cysts that have been drained over her back and now has some folliculitus in the L groin radiation fields. She would like to see a dermatologist and she will make a appointment with the skin surgery center where she has been seen before. PET scan will be in October when she sees medical oncology. I will see her back on a PRN basis.     Fatigue likely multifactorial. I will order a TSH to r/o hypothyroidism. _____________________________________   Eppie Gibson, MD  This document serves as a record of services personally performed by Eppie Gibson, MD. It was created on her behalf by Derek Mound, a trained medical scribe. The creation of this record is based on  the scribe's personal observations and the provider's statements to them. This document has been checked and approved by the attending provider.

## 2015-05-18 ENCOUNTER — Telehealth: Payer: Self-pay | Admitting: *Deleted

## 2015-05-18 NOTE — Telephone Encounter (Signed)
CALLED PATIENT TO ASK ABOUT COMING IN FOR LABS, LVM FOR A RETURN CALL

## 2015-05-22 ENCOUNTER — Ambulatory Visit
Admission: RE | Admit: 2015-05-22 | Discharge: 2015-05-22 | Disposition: A | Discharge status: Home / Self Care | Payer: BLUE CROSS/BLUE SHIELD | Source: Ambulatory Visit | Attending: Radiation Oncology | Admitting: Radiation Oncology

## 2015-05-22 DIAGNOSIS — C8330 Diffuse large B-cell lymphoma, unspecified site: Secondary | ICD-10-CM

## 2015-05-22 DIAGNOSIS — R5383 Other fatigue: Secondary | ICD-10-CM

## 2015-05-22 LAB — TSH CHCC: TSH: 1.309 m(IU)/L (ref 0.308–3.960)

## 2015-05-29 ENCOUNTER — Telehealth: Payer: Self-pay | Admitting: Radiation Oncology

## 2015-05-29 NOTE — Telephone Encounter (Signed)
-----   Message from Eppie Gibson, MD sent at 05/29/2015  9:02 AM EDT ----- Please let pt know that TSH is normal - thyroid function appears good Thanks  ----- Message -----    From: [facility] Interface    Sent: 05/22/2015  10:48 AM      To: Eppie Gibson, MD

## 2015-05-29 NOTE — Telephone Encounter (Signed)
Phoned to inform patient of normal TSH results. No answer. Left message requesting return call.

## 2015-06-12 ENCOUNTER — Telehealth: Payer: Self-pay | Admitting: Hematology and Oncology

## 2015-06-12 NOTE — Telephone Encounter (Signed)
Called and left a message that we did get her message to cancel todays flush

## 2015-06-14 ENCOUNTER — Ambulatory Visit (HOSPITAL_BASED_OUTPATIENT_CLINIC_OR_DEPARTMENT_OTHER): Payer: BLUE CROSS/BLUE SHIELD

## 2015-06-14 DIAGNOSIS — Z452 Encounter for adjustment and management of vascular access device: Secondary | ICD-10-CM

## 2015-06-14 DIAGNOSIS — C8590 Non-Hodgkin lymphoma, unspecified, unspecified site: Secondary | ICD-10-CM

## 2015-06-14 DIAGNOSIS — C8330 Diffuse large B-cell lymphoma, unspecified site: Secondary | ICD-10-CM

## 2015-06-14 MED ORDER — HEPARIN SOD (PORK) LOCK FLUSH 100 UNIT/ML IV SOLN
500.0000 [IU] | Freq: Once | INTRAVENOUS | Status: AC
  Administered 2015-06-14: 500 [IU] via INTRAVENOUS
  Filled 2015-06-14: qty 5

## 2015-06-14 MED ORDER — SODIUM CHLORIDE 0.9 % IJ SOLN
10.0000 mL | INTRAMUSCULAR | Status: DC | PRN
  Administered 2015-06-14: 10 mL via INTRAVENOUS
  Filled 2015-06-14: qty 10

## 2015-06-14 NOTE — Patient Instructions (Signed)

## 2015-06-20 ENCOUNTER — Other Ambulatory Visit: Payer: Self-pay | Admitting: Hematology and Oncology

## 2015-08-01 ENCOUNTER — Encounter (HOSPITAL_COMMUNITY)
Admission: RE | Admit: 2015-08-01 | Discharge: 2015-08-01 | Disposition: A | Discharge status: Home / Self Care | Payer: BLUE CROSS/BLUE SHIELD | Source: Ambulatory Visit | Attending: Hematology and Oncology | Admitting: Hematology and Oncology

## 2015-08-01 DIAGNOSIS — C8330 Diffuse large B-cell lymphoma, unspecified site: Secondary | ICD-10-CM | POA: Diagnosis present

## 2015-08-01 LAB — GLUCOSE, CAPILLARY: GLUCOSE-CAPILLARY: 75 mg/dL (ref 65–99)

## 2015-08-01 MED ORDER — FLUDEOXYGLUCOSE F - 18 (FDG) INJECTION
7.6000 | Freq: Once | INTRAVENOUS | Status: DC | PRN
  Administered 2015-08-01: 7.6 via INTRAVENOUS
  Filled 2015-08-01: qty 7.6

## 2015-08-03 ENCOUNTER — Other Ambulatory Visit (HOSPITAL_BASED_OUTPATIENT_CLINIC_OR_DEPARTMENT_OTHER): Payer: BLUE CROSS/BLUE SHIELD

## 2015-08-03 ENCOUNTER — Ambulatory Visit (HOSPITAL_BASED_OUTPATIENT_CLINIC_OR_DEPARTMENT_OTHER): Payer: BLUE CROSS/BLUE SHIELD | Admitting: Hematology and Oncology

## 2015-08-03 ENCOUNTER — Encounter: Payer: Self-pay | Admitting: Hematology and Oncology

## 2015-08-03 ENCOUNTER — Ambulatory Visit: Payer: BLUE CROSS/BLUE SHIELD

## 2015-08-03 ENCOUNTER — Telehealth: Payer: Self-pay | Admitting: Hematology and Oncology

## 2015-08-03 VITALS — BP 139/77 | HR 63 | Temp 98.0°F | Resp 19 | Ht 63.0 in | Wt 157.4 lb

## 2015-08-03 DIAGNOSIS — D3500 Benign neoplasm of unspecified adrenal gland: Secondary | ICD-10-CM | POA: Insufficient documentation

## 2015-08-03 DIAGNOSIS — Z8572 Personal history of non-Hodgkin lymphomas: Secondary | ICD-10-CM

## 2015-08-03 DIAGNOSIS — R490 Dysphonia: Secondary | ICD-10-CM | POA: Diagnosis not present

## 2015-08-03 DIAGNOSIS — C8330 Diffuse large B-cell lymphoma, unspecified site: Secondary | ICD-10-CM

## 2015-08-03 DIAGNOSIS — Z720 Tobacco use: Secondary | ICD-10-CM

## 2015-08-03 DIAGNOSIS — C8336 Diffuse large B-cell lymphoma, intrapelvic lymph nodes: Secondary | ICD-10-CM | POA: Insufficient documentation

## 2015-08-03 DIAGNOSIS — Z95828 Presence of other vascular implants and grafts: Secondary | ICD-10-CM

## 2015-08-03 HISTORY — DX: Dysphonia: R49.0

## 2015-08-03 HISTORY — DX: Benign neoplasm of unspecified adrenal gland: D35.00

## 2015-08-03 LAB — COMPREHENSIVE METABOLIC PANEL (CC13)
ALBUMIN: 3.9 g/dL (ref 3.5–5.0)
ALK PHOS: 47 U/L (ref 40–150)
ALT: 33 U/L (ref 0–55)
ANION GAP: 8 meq/L (ref 3–11)
AST: 25 U/L (ref 5–34)
BILIRUBIN TOTAL: 0.37 mg/dL (ref 0.20–1.20)
BUN: 14.1 mg/dL (ref 7.0–26.0)
CO2: 23 mEq/L (ref 22–29)
Calcium: 9.5 mg/dL (ref 8.4–10.4)
Chloride: 110 mEq/L — ABNORMAL HIGH (ref 98–109)
Creatinine: 0.7 mg/dL (ref 0.6–1.1)
EGFR: >90 mL/min/{1.73_m2} (ref >90)
Glucose: 101 mg/dl (ref 70–140)
Potassium: 4.1 mEq/L (ref 3.5–5.1)
Sodium: 141 mEq/L (ref 136–145)
TOTAL PROTEIN: 6.7 g/dL (ref 6.4–8.3)

## 2015-08-03 LAB — CBC WITH DIFFERENTIAL/PLATELET
BASO%: 1.1 % (ref 0.0–2.0)
Basophils Absolute: 0.1 10*3/uL (ref 0.0–0.1)
EOS ABS: 0.2 10*3/uL (ref 0.0–0.5)
EOS%: 3.6 % (ref 0.0–7.0)
HCT: 41.3 % (ref 34.8–46.6)
HEMOGLOBIN: 13.7 g/dL (ref 11.6–15.9)
LYMPH%: 25.8 % (ref 14.0–49.7)
MCH: 31.7 pg (ref 25.1–34.0)
MCHC: 33.2 g/dL (ref 31.5–36.0)
MCV: 95.6 fL (ref 79.5–101.0)
MONO#: 0.4 10*3/uL (ref 0.1–0.9)
MONO%: 7.8 % (ref 0.0–14.0)
NEUT%: 61.7 % (ref 38.4–76.8)
NEUTROS ABS: 2.9 10*3/uL (ref 1.5–6.5)
PLATELETS: 270 10*3/uL (ref 145–400)
RBC: 4.33 10*6/uL (ref 3.70–5.45)
RDW: 15 % — AB (ref 11.2–14.5)
WBC: 4.7 10*3/uL (ref 3.9–10.3)
lymph#: 1.2 10*3/uL (ref 0.9–3.3)

## 2015-08-03 LAB — LACTATE DEHYDROGENASE (CC13): LDH: 177 U/L (ref 125–245)

## 2015-08-03 MED ORDER — HEPARIN SOD (PORK) LOCK FLUSH 100 UNIT/ML IV SOLN
500.0000 [IU] | Freq: Once | INTRAVENOUS | Status: AC
  Administered 2015-08-03: 500 [IU] via INTRAVENOUS
  Filled 2015-08-03: qty 5

## 2015-08-03 MED ORDER — SODIUM CHLORIDE 0.9 % IJ SOLN
10.0000 mL | INTRAMUSCULAR | Status: DC | PRN
  Administered 2015-08-03: 10 mL via INTRAVENOUS
  Filled 2015-08-03: qty 10

## 2015-08-03 NOTE — Assessment & Plan Note (Signed)
I spent some time counseling the patient the importance of tobacco cessation. She is currently not interested to quit now. 

## 2015-08-03 NOTE — Assessment & Plan Note (Signed)
She has worsening hoarseness. She had ENT evaluation a long time ago. PET CT scan show activity around the larynx region which could be infection. Malignancy cannot be excluded. I will refer her to ENT for evaluation

## 2015-08-03 NOTE — Patient Instructions (Signed)

## 2015-08-03 NOTE — Progress Notes (Signed)
[place] OFFICE PROGRESS NOTE  Patient Care Team: Maurice Small, MD as PCP - General (Family Medicine)  SUMMARY OF ONCOLOGIC HISTORY: Oncology History   Diffuse large B cell lymphoma   Staging form: Lymphoid Neoplasms, AJCC 6th Edition     Clinical stage from 12/01/2014: Stage I - Signed by Heath Lark, MD on 12/01/2014        History of B-cell lymphoma   09/08/2014 Imaging Ultrasound pelvis detected abnormal soft tissue mass in the left groin   09/13/2014 Imaging Ultrasound abdomen showed up to 7 cm mass-like area of mild hypoechogenicity in the left hepatic lobe   09/21/2014 Imaging MRI of the abdomen showed 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   11/23/2014 Bone Marrow Biopsy Accession: [id number] is negative for lymphoma   11/30/2014 - 01/11/2015 Chemotherapy She received chemotherapy with RCHOP X 3 cycles   11/30/2014 Adverse Reaction Treatment was complicated by mild hypersensitivity reaction to rituximab   01/30/2015 Imaging Repeat PET CT scan showed complete response to the lymphadenopathy.   02/23/2015 - 03/22/2015 Radiation Therapy She received radiation therapy 36 Gy in 20 fractions to left groin area   08/01/2015 Imaging PET scan showed complete response to Rx    INTERVAL HISTORY: Please see below for problem oriented charting. She returns for further follow-up regarding lymphoma. She denies new lymphadenopathy. She complained of mild sensation of her left leg after radiation therapy but no focal neurological deficit. The patient is noticed to have progressive hoarseness of her voice. She had recent sore throat. She denies nasal drainage or  fevers. She complained of fatigue. She is worried about central obesity and recent weight gain Her recent skin infection on the back has healed but there is an adjacent lump to it.   REVIEW OF SYSTEMS:   Constitutional: Denies fevers, chills or abnormal weight loss Eyes: Denies blurriness of vision Ears, nose, mouth, throat, and face: Denies mucositis or sore throat Respiratory: Denies cough, dyspnea or wheezes Cardiovascular: Denies palpitation, chest discomfort or lower extremity swelling Gastrointestinal:  Denies nausea, heartburn or change in bowel habits Lymphatics: Denies new lymphadenopathy or easy bruising Neurological:Denies numbness, tingling or new weaknesses Behavioral/Psych: Mood is stable, no new changes  All other systems were reviewed with the patient and are negative.  I have reviewed the past medical history, past surgical history, social history and family history with the patient and they are unchanged from previous note.  ALLERGIES:  is allergic to adhesive; influenza vaccines; prednisone; and sulfa antibiotics.  MEDICATIONS:  Current Outpatient Prescriptions  Medication Sig Dispense Refill  . HYDROcodone-acetaminophen (NORCO) 7.5-325 MG per tablet Take 7.5 tablets by mouth.  0  . ibuprofen (ADVIL) 200 MG tablet Take 200 mg by mouth every 6 (six) hours as needed.    . Multiple Vitamins-Minerals (MULTIVITAMIN PO) Take by mouth daily.    Marland Kitchen UNABLE TO FIND Take 1 tablet by mouth at bedtime. Med Name: Supplement "DGL"    . Glucosamine-Chondroit-Vit C-Mn (GLUCOSAMINE CHONDR 1500 COMPLX PO) Take 1 tablet by mouth daily.     . traMADol (ULTRAM) 50 MG tablet Take 1 tablet (50 mg total) by mouth every 6 (six) hours as needed for moderate pain. (Patient not taking: Reported on  03/06/2015) 60 tablet 0   No current facility-administered medications for this visit.   Facility-Administered Medications Ordered in Other Visits  Medication Dose Route Frequency Provider Last Rate  Last Dose  . fludeoxyglucose F - 18 (FDG) injection 7.6 milli Curie  7.6 milli Curie Intravenous Once PRN Medication Radiologist, MD   7.6 milli Curie at 08/01/15 1211    PHYSICAL EXAMINATION: ECOG PERFORMANCE STATUS: 1 - Symptomatic but completely ambulatory  Filed Vitals:   08/03/15 0852  BP: 139/77  Pulse: 63  Temp: 98 F (36.7 C)  Resp: 19   Filed Weights   08/03/15 0852  Weight: 157 lb 6.4 oz (71.396 kg)    GENERAL:alert, no distress and comfortable SKIN: skin color, texture, turgor are normal, no rashes or significant lesions. Prior skin infection site has healed and that is an adjacent fatty lump next to it which does not appear to be malignant  EYES: normal, Conjunctiva are pink and non-injected, sclera clear OROPHARYNX:no exudate, no erythema and lips, buccal mucosa, and tongue normal  NECK: supple, thyroid normal size, non-tender, without nodularity LYMPH:  no palpable lymphadenopathy in the cervical, axillary or inguinal LUNGS: clear to auscultation and percussion with normal breathing effort HEART: regular rate & rhythm and no murmurs and no lower extremity edema ABDOMEN:abdomen soft, non-tender and normal bowel sounds Musculoskeletal:no cyanosis of digits and no clubbing  NEURO: alert & oriented x 3 with fluent speech but with progressive hoarseness of her voice , no focal motor/sensory deficits  LABORATORY DATA:  I have reviewed the data as listed    Component Value Date/Time   NA 141 08/03/2015 0826   NA 141 11/16/2014 0949   K 4.1 08/03/2015 0826   K 4.5 11/16/2014 0949   CL 103 11/16/2014 0949   CO2 23 08/03/2015 0826   CO2 27 11/16/2014 0949   GLUCOSE 101 08/03/2015 0826   GLUCOSE 101* 11/16/2014 0949   BUN 14.1 08/03/2015 0826   BUN 12 11/16/2014 0949   CREATININE 0.7 08/03/2015 0826   CREATININE 0.87 11/16/2014 0949   CALCIUM 9.5 08/03/2015 0826   CALCIUM 10.0 11/16/2014 0949   PROT 6.7 08/03/2015 0826   PROT 6.8 11/16/2014 0949   ALBUMIN 3.9  08/03/2015 0826   ALBUMIN 4.0 11/16/2014 0949   AST 25 08/03/2015 0826   AST 23 11/16/2014 0949   ALT 33 08/03/2015 0826   ALT 23 11/16/2014 0949   ALKPHOS 47 08/03/2015 0826   ALKPHOS 40 11/16/2014 0949   BILITOT 0.37 08/03/2015 0826   BILITOT 0.6 11/16/2014 0949   GFRNONAA 76* 11/16/2014 0949   GFRAA 88* 11/16/2014 0949    No results found for: SPEP, UPEP  Lab Results  Component Value Date   WBC 4.7 08/03/2015   NEUTROABS 2.9 08/03/2015   HGB 13.7 08/03/2015   HCT 41.3 08/03/2015   MCV 95.6 08/03/2015   PLT 270 08/03/2015      Chemistry      Component Value Date/Time   NA 141 08/03/2015 0826   NA 141 11/16/2014 0949   K 4.1 08/03/2015 0826   K 4.5 11/16/2014 0949   CL 103 11/16/2014 0949   CO2 23 08/03/2015 0826   CO2 27 11/16/2014 0949   BUN 14.1 08/03/2015 0826   BUN 12 11/16/2014 0949   CREATININE 0.7 08/03/2015 0826   CREATININE 0.87 11/16/2014 0949      Component Value Date/Time   CALCIUM 9.5 08/03/2015 0826   CALCIUM 10.0 11/16/2014 0949   ALKPHOS 47 08/03/2015 0826  ALKPHOS 40 11/16/2014 0949   AST 25 08/03/2015 0826   AST 23 11/16/2014 0949   ALT 33 08/03/2015 0826   ALT 23 11/16/2014 0949   BILITOT 0.37 08/03/2015 0826   BILITOT 0.6 11/16/2014 0949       RADIOGRAPHIC STUDIES: I have personally reviewed the radiological images as listed and agreed with the findings in the report. Nm Pet Image Restag (ps) Skull Base To Thigh  08/01/2015   CLINICAL DATA:  Subsequent treatment strategy for diffuse large B-cell lymphoma.  EXAM: NUCLEAR MEDICINE PET SKULL BASE TO THIGH  TECHNIQUE: 7.6 mCi F-18 FDG was injected intravenously. Full-ring PET imaging was performed from the skull base to thigh after the radiotracer. CT data was obtained and used for attenuation correction and anatomic localization.  FASTING BLOOD GLUCOSE:  Value: 75 mg/dl  COMPARISON:  Multiple exams, including 01/30/2015  FINDINGS: NECK  Low-level thyroid activity similar to prior.  CHEST   No hypermetabolic mediastinal or hilar nodes. No suspicious pulmonary nodules on the CT scan. Background mediastinal maximum standard uptake value 3.1. Prior subcutaneous lesion along the right upper back which was previously hypermetabolic has resolved.  ABDOMEN/PELVIS  Prior left inguinal lymph node currently measures 7 mm in short axis, with maximum standard uptake value 1.1, not elevated. No new adenopathy. Background hepatic activity maximum standard uptake value 3.6. 3.3 by 3.0 cm left adrenal adenoma, internal density -7 Hounsfield units. Diffuse hepatic steatosis.  Hypodense lesion posteriorly in the lateral segment left hepatic lobe has been previously shown to be a cavernous hemangioma on 09/21/2014.  SKELETON  No focal hypermetabolic activity to suggest skeletal metastasis.  IMPRESSION: 1. No current residual abnormal metabolic activity or adenopathy. The original enlarged and hypermetabolic left inguinal lymph node shown on the 11/29/2014 exam currently measures 7 mm in short axis and without abnormal hypermetabolic activity. 2. Left adrenal adenoma. 3. Prior lesion along the right upper back subcutaneous tissues has resolved. 4. 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   Electronically Signed   By: Van Clines M.D.   On: 08/01/2015 12:50     ASSESSMENT & PLAN:  History of B-cell lymphoma PET CT scan showed no evidence of residual lymphoma. Imaging study detected abnormalities around the vocal cord. I will send her to ENT. In the meantime, we will keep the port until we excluded new malignancy of the head and neck.  Tobacco abuse I spent some time counseling the patient the importance of tobacco cessation. She is currently not interested to quit now.   Hoarseness of voice She has worsening hoarseness. She had ENT evaluation a long time ago. PET CT scan show activity around the larynx region which could be infection. Malignancy cannot be  excluded. I will refer her to ENT for evaluation  Adrenal adenoma She has known thyroid issues and adrenal adenoma in the past. The patient complained of fatigue and is worried about central obesity and other symptoms that could be related to hormone imbalances. Her blood pressure is not elevated and her blood tests were all within normal limits. She is requesting an endocrinology consult which I think is appropriate.   Orders Placed This Encounter  Procedures  . Ambulatory referral to ENT    Referral Priority:  Routine    Referral Type:  Consultation    Referral Reason:  Specialty Services Required    Referred to Provider:  Izora Gala, MD    Requested Specialty:  Otolaryngology    Number of Visits Requested:  1  . Ambulatory referral to Endocrinology    Referral Priority:  Routine    Referral Type:  Consultation    Referral Reason:  Specialty Services Required    Number of Visits Requested:  1   All questions were answered. The patient knows to call the clinic with any problems, questions or concerns. No barriers to learning was detected. I spent 30 minutes counseling the patient face to face. The total time spent in the appointment was 40 minutes and more than 50% was on counseling and review of test results     [facility], [place], MD 08/03/2015 10:13 AM

## 2015-08-03 NOTE — Assessment & Plan Note (Addendum)
PET CT scan showed no evidence of residual lymphoma. Imaging study detected abnormalities around the vocal cord. I will send her to ENT. In the meantime, we will keep the port until we excluded new malignancy of the head and neck.

## 2015-08-03 NOTE — Assessment & Plan Note (Signed)
She has known thyroid issues and adrenal adenoma in the past. The patient complained of fatigue and is worried about central obesity and other symptoms that could be related to hormone imbalances. Her blood pressure is not elevated and her blood tests were all within normal limits. She is requesting an endocrinology consult which I think is appropriate.

## 2015-08-03 NOTE — Telephone Encounter (Signed)
GAVE AND PRINTED APPT SCHED AND AVS FOR PT FOR oct THRU [facility]

## 2015-08-04 ENCOUNTER — Telehealth: Payer: Self-pay | Admitting: Hematology and Oncology

## 2015-08-04 ENCOUNTER — Telehealth: Payer: Self-pay | Admitting: *Deleted

## 2015-08-04 NOTE — Telephone Encounter (Signed)
lvm fo rpt regarding to OCT appt with Dr. Constance Holster 10.18 @ 10AM...lvm for [facility] and [facility] faxing records....mailed pt appt sched for OCT thru April

## 2015-08-04 NOTE — Telephone Encounter (Signed)
Faxed pt medical records to [facility].

## 2015-08-04 NOTE — Telephone Encounter (Signed)
Faxed pt medical records to Dr. Constance Holster

## 2015-08-04 NOTE — Telephone Encounter (Signed)
  Oncology Nurse Navigator Documentation   Navigator Encounter Type: Telephone (08/04/15 1531)         Interventions: Coordination of Care (08/04/15 1531)     Per Dr. Calton Dach guidance, called [facility] to arrange follow-up visit for earliest date possible.  Spoke with Hedda Slade, requested that patient be contacted and appt arranged to see Dr. Constance Holster for increased hoarseness per her appt with Dr. Alvy Bimler this week.  I indicated patient expressed preference to see Dr. Constance Holster.  Jokebed indicated a transfer of care would need to be arranged since patient is currently a patient of Dr. Redmond Baseman, she would initiate.  Gayleen Orem, RN, BSN, [place] [facility] [place] [phone]              Time Spent with Patient: 15 (08/04/15 1531)

## 2015-08-17 ENCOUNTER — Telehealth: Payer: Self-pay | Admitting: *Deleted

## 2015-08-17 NOTE — Telephone Encounter (Signed)
-----   Message from Heath Lark, MD sent at 08/17/2015  7:54 AM EDT ----- Regarding: port removal ENT review OK When does patient wants port removed?

## 2015-08-17 NOTE — Telephone Encounter (Signed)
Left message for patient to return call.

## 2015-08-21 ENCOUNTER — Telehealth: Payer: Self-pay | Admitting: *Deleted

## 2015-08-21 NOTE — Telephone Encounter (Signed)
Informed pt of ENT report good and ask if she wants to have her PAC removed per Dr. Alvy Bimler.  Pt states she would like to have removed soon.  Dr. Barry Dienes put it in.  Informed her Dr. Alvy Bimler will send message to Dr. Barry Dienes that she can remove it now.   Pt says she may want to have IR remove it as it may be able to be scheduled sooner.  Informed her Dr. Alvy Bimler can also ask Dr. Barry Dienes if it is ok for IR to remove.  Pt will call back tomorrow to let nurse know if she wants IR to remove or Dr. Barry Dienes.

## 2015-08-22 ENCOUNTER — Telehealth: Payer: Self-pay | Admitting: *Deleted

## 2015-08-22 NOTE — Telephone Encounter (Signed)
Message sent to Dr Barry Dienes

## 2015-08-22 NOTE — Telephone Encounter (Signed)
Pt would like PAC removed as soon as next week if possible. She prefers to have removed by I.R. If possible.  Informed pt Dr. Alvy Bimler will send message to Dr. Barry Dienes to make sure it is ok w/ her for [place] to order PAC removal by IR.   Pt states she just wants to have it done as quickly and simply as possible but she will have Dr. Barry Dienes remove it if she can get it scheduled soon.

## 2015-08-23 ENCOUNTER — Other Ambulatory Visit: Payer: Self-pay | Admitting: General Surgery

## 2015-09-04 ENCOUNTER — Telehealth: Payer: Self-pay | Admitting: *Deleted

## 2015-09-04 NOTE — Telephone Encounter (Signed)
Pt left VM states she has not heard anything from anyone about getting her PAC removed.  She was waiting for a call from either Dr. Marlowe Aschoff office or IR to schedule PAC removal.

## 2015-09-05 ENCOUNTER — Telehealth: Payer: Self-pay | Admitting: *Deleted

## 2015-09-05 NOTE — Telephone Encounter (Signed)
I have contacted Dr. Barry Dienes personally and she said she will arrange it Please call her office to inquire why this has not been scheduled

## 2015-09-05 NOTE — Telephone Encounter (Signed)
RN called Dr Marlowe Aschoff office to see why patient has not been scheduled for port removal. Dr Alvy Bimler had talked with Dr Barry Dienes about this. They will call her to schedule

## 2015-09-07 ENCOUNTER — Telehealth: Payer: Self-pay

## 2015-09-07 ENCOUNTER — Other Ambulatory Visit: Payer: Self-pay | Admitting: Hematology and Oncology

## 2015-09-07 NOTE — Telephone Encounter (Signed)
Surgeon cannot remove PAC until next year. Can she be schedule with radiology for removal please. Wants this year d/t insurance deductibles already met.

## 2015-09-08 NOTE — Telephone Encounter (Signed)
lvm for pt re: Dr Calton Dach response

## 2015-09-08 NOTE — Telephone Encounter (Signed)
Dr. Barry Dienes was notified in regards to the patient's concern and she will be working with her office to try to get this removed before the end of the year

## 2015-09-28 ENCOUNTER — Encounter (HOSPITAL_BASED_OUTPATIENT_CLINIC_OR_DEPARTMENT_OTHER): Payer: Self-pay | Admitting: *Deleted

## 2015-10-04 ENCOUNTER — Ambulatory Visit (HOSPITAL_BASED_OUTPATIENT_CLINIC_OR_DEPARTMENT_OTHER): Payer: BLUE CROSS/BLUE SHIELD | Admitting: Anesthesiology

## 2015-10-04 ENCOUNTER — Ambulatory Visit (HOSPITAL_BASED_OUTPATIENT_CLINIC_OR_DEPARTMENT_OTHER)
Admission: RE | Admit: 2015-10-04 | Discharge: 2015-10-04 | Disposition: A | Discharge status: Home / Self Care | Payer: BLUE CROSS/BLUE SHIELD | Source: Ambulatory Visit | Attending: General Surgery | Admitting: General Surgery

## 2015-10-04 ENCOUNTER — Encounter (HOSPITAL_BASED_OUTPATIENT_CLINIC_OR_DEPARTMENT_OTHER)
Admission: RE | Disposition: A | Discharge status: Home / Self Care | Payer: Self-pay | Source: Ambulatory Visit | Attending: General Surgery

## 2015-10-04 ENCOUNTER — Encounter (HOSPITAL_BASED_OUTPATIENT_CLINIC_OR_DEPARTMENT_OTHER): Payer: Self-pay | Admitting: *Deleted

## 2015-10-04 DIAGNOSIS — Z452 Encounter for adjustment and management of vascular access device: Secondary | ICD-10-CM | POA: Insufficient documentation

## 2015-10-04 DIAGNOSIS — Z8579 Personal history of other malignant neoplasms of lymphoid, hematopoietic and related tissues: Secondary | ICD-10-CM | POA: Insufficient documentation

## 2015-10-04 DIAGNOSIS — M1990 Unspecified osteoarthritis, unspecified site: Secondary | ICD-10-CM | POA: Insufficient documentation

## 2015-10-04 DIAGNOSIS — F17210 Nicotine dependence, cigarettes, uncomplicated: Secondary | ICD-10-CM | POA: Diagnosis not present

## 2015-10-04 DIAGNOSIS — K219 Gastro-esophageal reflux disease without esophagitis: Secondary | ICD-10-CM | POA: Diagnosis not present

## 2015-10-04 HISTORY — PX: PORT-A-CATH REMOVAL: SHX5289

## 2015-10-04 SURGERY — REMOVAL PORT-A-CATH
Anesthesia: Monitor Anesthesia Care | Site: Chest | Laterality: Left

## 2015-10-04 MED ORDER — FENTANYL CITRATE (PF) 100 MCG/2ML IJ SOLN
50.0000 ug | INTRAMUSCULAR | Status: DC | PRN
Start: 2015-10-04 — End: 2015-10-04

## 2015-10-04 MED ORDER — LIDOCAINE-EPINEPHRINE (PF) 1 %-1:200000 IJ SOLN
INTRAMUSCULAR | Status: DC | PRN
  Administered 2015-10-04: 8 mL via INTRAMUSCULAR

## 2015-10-04 MED ORDER — FENTANYL CITRATE (PF) 100 MCG/2ML IJ SOLN: 50.0000 ug | INTRAMUSCULAR | Status: DC | PRN

## 2015-10-04 MED ORDER — BUPIVACAINE HCL (PF) 0.25 % IJ SOLN
INTRAMUSCULAR | Status: AC
  Filled 2015-10-04: qty 30

## 2015-10-04 MED ORDER — PROMETHAZINE HCL 25 MG/ML IJ SOLN: 6.2500 mg | INTRAMUSCULAR | Status: DC | PRN

## 2015-10-04 MED ORDER — PROPOFOL 10 MG/ML IV BOLUS
INTRAVENOUS | Status: DC | PRN
  Administered 2015-10-04 (×2): 30 mg via INTRAVENOUS

## 2015-10-04 MED ORDER — CEFAZOLIN SODIUM-DEXTROSE 2-3 GM-% IV SOLR
INTRAVENOUS | Status: AC
  Filled 2015-10-04: qty 50

## 2015-10-04 MED ORDER — LACTATED RINGERS IV SOLN
INTRAVENOUS | Status: DC
  Administered 2015-10-04: 09:00:00 via INTRAVENOUS
  Administered 2015-10-04: 10 mL/h via INTRAVENOUS

## 2015-10-04 MED ORDER — MIDAZOLAM HCL 2 MG/2ML IJ SOLN
INTRAMUSCULAR | Status: AC
  Filled 2015-10-04: qty 2

## 2015-10-04 MED ORDER — MIDAZOLAM HCL 2 MG/2ML IJ SOLN: 1.0000 mg | INTRAMUSCULAR | Status: DC | PRN

## 2015-10-04 MED ORDER — CEFAZOLIN SODIUM-DEXTROSE 2-3 GM-% IV SOLR
2.0000 g | INTRAVENOUS | Status: AC
  Administered 2015-10-04: 2 g via INTRAVENOUS

## 2015-10-04 MED ORDER — LIDOCAINE-EPINEPHRINE (PF) 1 %-1:200000 IJ SOLN
INTRAMUSCULAR | Status: AC
  Filled 2015-10-04: qty 30

## 2015-10-04 MED ORDER — MEPERIDINE HCL 25 MG/ML IJ SOLN: 6.2500 mg | INTRAMUSCULAR | Status: DC | PRN

## 2015-10-04 MED ORDER — SCOPOLAMINE 1 MG/3DAYS TD PT72: 1.0000 | MEDICATED_PATCH | Freq: Once | TRANSDERMAL | Status: DC | PRN

## 2015-10-04 MED ORDER — LIDOCAINE HCL (CARDIAC) 20 MG/ML IV SOLN
INTRAVENOUS | Status: DC | PRN
  Administered 2015-10-04: 20 mg via INTRAVENOUS

## 2015-10-04 MED ORDER — LIDOCAINE HCL (CARDIAC) 20 MG/ML IV SOLN
INTRAVENOUS | Status: AC
  Filled 2015-10-04: qty 5

## 2015-10-04 MED ORDER — HYDROMORPHONE HCL 1 MG/ML IJ SOLN
0.2500 mg | INTRAMUSCULAR | Status: DC | PRN
  Administered 2015-10-04: 0.5 mg via INTRAVENOUS

## 2015-10-04 MED ORDER — PROPOFOL 10 MG/ML IV BOLUS
INTRAVENOUS | Status: AC
  Filled 2015-10-04: qty 40

## 2015-10-04 MED ORDER — HYDROCODONE-ACETAMINOPHEN 7.5-325 MG PO TABS: 7.5000 | ORAL_TABLET | ORAL | Status: DC | PRN

## 2015-10-04 MED ORDER — ONDANSETRON HCL 4 MG/2ML IJ SOLN
INTRAMUSCULAR | Status: AC
  Filled 2015-10-04: qty 2

## 2015-10-04 MED ORDER — MIDAZOLAM HCL 5 MG/5ML IJ SOLN
INTRAMUSCULAR | Status: DC | PRN
  Administered 2015-10-04: 2 mg via INTRAVENOUS

## 2015-10-04 MED ORDER — FENTANYL CITRATE (PF) 100 MCG/2ML IJ SOLN
INTRAMUSCULAR | Status: DC | PRN
  Administered 2015-10-04 (×2): 50 ug via INTRAVENOUS

## 2015-10-04 MED ORDER — BUPIVACAINE-EPINEPHRINE (PF) 0.5% -1:200000 IJ SOLN
INTRAMUSCULAR | Status: AC
  Filled 2015-10-04: qty 30

## 2015-10-04 MED ORDER — GLYCOPYRROLATE 0.2 MG/ML IJ SOLN: 0.2000 mg | Freq: Once | INTRAMUSCULAR | Status: DC | PRN

## 2015-10-04 MED ORDER — FENTANYL CITRATE (PF) 100 MCG/2ML IJ SOLN
INTRAMUSCULAR | Status: AC
Start: 2015-10-04 — End: 2015-10-04
  Filled 2015-10-04: qty 2

## 2015-10-04 MED ORDER — DEXAMETHASONE SODIUM PHOSPHATE 10 MG/ML IJ SOLN
INTRAMUSCULAR | Status: AC
Start: 2015-10-04 — End: 2015-10-04
  Filled 2015-10-04: qty 1

## 2015-10-04 MED ORDER — ONDANSETRON HCL 4 MG/2ML IJ SOLN
INTRAMUSCULAR | Status: DC | PRN
  Administered 2015-10-04: 4 mg via INTRAVENOUS

## 2015-10-04 MED ORDER — HYDROMORPHONE HCL 1 MG/ML IJ SOLN
INTRAMUSCULAR | Status: AC
  Filled 2015-10-04: qty 1

## 2015-10-04 SURGICAL SUPPLY — 38 items
ADH SKN CLS APL DERMABOND .7 (GAUZE/BANDAGES/DRESSINGS) ×1
BLADE HEX COATED 2.75 (ELECTRODE) ×2 IMPLANT
BLADE SURG 15 STRL LF DISP TIS (BLADE) ×1 IMPLANT
BLADE SURG 15 STRL SS (BLADE) ×2
CANISTER SUCT 1200ML W/VALVE (MISCELLANEOUS) IMPLANT
CHLORAPREP W/TINT 26ML (MISCELLANEOUS) ×2 IMPLANT
COVER BACK TABLE 60X90IN (DRAPES) ×2 IMPLANT
COVER MAYO STAND STRL (DRAPES) ×2 IMPLANT
DECANTER SPIKE VIAL GLASS SM (MISCELLANEOUS) IMPLANT
DERMABOND ADVANCED (GAUZE/BANDAGES/DRESSINGS) ×1
DERMABOND ADVANCED .7 DNX12 (GAUZE/BANDAGES/DRESSINGS) IMPLANT
DRAPE LAPAROTOMY 100X72 PEDS (DRAPES) ×2 IMPLANT
DRAPE UTILITY XL STRL (DRAPES) ×2 IMPLANT
ELECT REM PT RETURN 9FT ADLT (ELECTROSURGICAL) ×2
ELECTRODE REM PT RTRN 9FT ADLT (ELECTROSURGICAL) ×1 IMPLANT
GLOVE BIO SURGEON STRL SZ 6 (GLOVE) ×2 IMPLANT
GLOVE BIOGEL PI IND STRL 6.5 (GLOVE) ×1 IMPLANT
GLOVE BIOGEL PI IND STRL 7.0 (GLOVE) IMPLANT
GLOVE BIOGEL PI INDICATOR 6.5 (GLOVE) ×1
GLOVE BIOGEL PI INDICATOR 7.0 (GLOVE) ×2
GLOVE SURG SS PI 6.5 STRL IVOR (GLOVE) ×1 IMPLANT
GOWN STRL REUS W/ TWL LRG LVL3 (GOWN DISPOSABLE) ×1 IMPLANT
GOWN STRL REUS W/TWL 2XL LVL3 (GOWN DISPOSABLE) ×2 IMPLANT
GOWN STRL REUS W/TWL LRG LVL3 (GOWN DISPOSABLE) ×2
LIQUID BAND (GAUZE/BANDAGES/DRESSINGS) ×1 IMPLANT
NDL HYPO 25X1 1.5 SAFETY (NEEDLE) ×1 IMPLANT
NEEDLE HYPO 25X1 1.5 SAFETY (NEEDLE) ×2 IMPLANT
NS IRRIG 1000ML POUR BTL (IV SOLUTION) IMPLANT
PACK BASIN DAY SURGERY FS (CUSTOM PROCEDURE TRAY) ×2 IMPLANT
PENCIL BUTTON HOLSTER BLD 10FT (ELECTRODE) ×2 IMPLANT
SUT MNCRL AB 4-0 PS2 18 (SUTURE) ×2 IMPLANT
SUT VIC AB 3-0 SH 27 (SUTURE) ×2
SUT VIC AB 3-0 SH 27X BRD (SUTURE) ×1 IMPLANT
SYR CONTROL 10ML LL (SYRINGE) ×2 IMPLANT
TOWEL OR 17X24 6PK STRL BLUE (TOWEL DISPOSABLE) ×2 IMPLANT
TOWEL OR NON WOVEN STRL DISP B (DISPOSABLE) ×2 IMPLANT
TUBE CONNECTING 20X1/4 (TUBING) IMPLANT
YANKAUER SUCT BULB TIP NO VENT (SUCTIONS) IMPLANT

## 2015-10-04 NOTE — Anesthesia Preprocedure Evaluation (Signed)
Anesthesia Evaluation  Patient identified by MRN, date of birth, ID band Patient awake    Reviewed: Allergy & Precautions, NPO status , Patient's Chart, lab work & pertinent test results  Airway Mallampati: II  TM Distance: >3 FB Neck ROM: Full    Dental no notable dental hx. (+) Teeth Intact, Dental Advisory Given   Pulmonary Current Smoker,    Pulmonary exam normal breath sounds clear to auscultation       Cardiovascular negative cardio ROS Normal cardiovascular exam Rhythm:Regular Rate:Normal  123456 ECHO: Systolic function wasnormal. EF 60-65%. Wall motion was normal; there were no regional wall motion abnormalities. Mild MR.   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   Endo/Other  Adrenal adenomas bilatera  Renal/GU negative Renal ROS     Musculoskeletal  (+) Arthritis ,   Abdominal   Peds  Hematology lymphoma   Anesthesia Other Findings   Reproductive/Obstetrics negative OB ROS                             Anesthesia Physical  Anesthesia Plan  ASA: II  Anesthesia Plan: MAC   Post-op Pain Management:    Induction: Intravenous  Airway Management Planned:   Additional Equipment:   Intra-op Plan:   Post-operative Plan:   Informed Consent: I have reviewed the patients History and Physical, chart, labs and discussed the procedure including the risks, benefits and alternatives for the proposed anesthesia with the patient or authorized representative who has indicated his/her understanding and acceptance.   Dental advisory given  Plan Discussed with: CRNA  Anesthesia Plan Comments:         Anesthesia Quick Evaluation

## 2015-10-04 NOTE — Discharge Instructions (Addendum)
[place] Office Phone Number [phone]   POST OP INSTRUCTIONS  Always review your discharge instruction sheet given to you by the facility where your surgery was performed.  IF YOU HAVE DISABILITY OR FAMILY LEAVE FORMS, YOU MUST BRING THEM TO THE OFFICE FOR PROCESSING.  DO NOT GIVE THEM TO YOUR DOCTOR.  1. A prescription for pain medication may be given to you upon discharge.  Take your pain medication as prescribed, if needed.  If narcotic pain medicine is not needed, then you may take acetaminophen (Tylenol) or ibuprofen (Advil) as needed. 2. Take your usually prescribed medications unless otherwise directed 3. If you need a refill on your pain medication, please contact your pharmacy.  They will contact our office to request authorization.  Prescriptions will not be filled after 5pm or on week-ends. 4. You should eat very light the first 24 hours after surgery, such as soup, crackers, pudding, etc.  Resume your normal diet the day after surgery 5. It is common to experience some constipation if taking pain medication after surgery.  Increasing fluid intake and taking a stool softener will usually help or prevent this problem from occurring.  A mild laxative (Milk of Magnesia or Miralax) should be taken according to package directions if there are no bowel movements after 48 hours. 6. You may shower in 48 hours.  The surgical glue will flake off in 2-3 weeks.   7. ACTIVITIES:  No strenuous activity or heavy lifting for 1 week.   a. You may drive when you no longer are taking prescription pain medication, you can comfortably wear a seatbelt, and you can safely maneuver your car and apply brakes. b. RETURN TO WORK:  __________1 week or as tolerated.______________ Dennis Bast should see your doctor in the office for a follow-up appointment approximately three-four weeks after your surgery.    WHEN TO CALL YOUR DOCTOR: 1. Fever over 101.0 2. Nausea and/or vomiting. 3. Extreme  swelling or bruising. 4. Continued bleeding from incision. 5. Increased pain, redness, or drainage from the incision.  The clinic staff is available to answer your questions during regular business hours.  Please dont hesitate to call and ask to speak to one of the nurses for clinical concerns.  If you have a medical emergency, go to the nearest emergency room or call 911.  A surgeon from [facility] is always on call at the hospital.  For further questions, please visit [email or web address]     Post Anesthesia Home Care Instructions  Activity: Get plenty of rest for the remainder of the day. A responsible adult should stay with you for 24 hours following the procedure.  For the next 24 hours, DO NOT: -Drive a car -Paediatric nurse -Drink alcoholic beverages -Take any medication unless instructed by your physician -Make any legal decisions or sign important papers.  Meals: Start with liquid foods such as gelatin or soup. Progress to regular foods as tolerated. Avoid greasy, spicy, heavy foods. If nausea and/or vomiting occur, drink only clear liquids until the nausea and/or vomiting subsides. Call your physician if vomiting continues.  Special Instructions/Symptoms: Your throat may feel dry or sore from the anesthesia or the breathing tube placed in your throat during surgery. If this causes discomfort, gargle with warm salt water. The discomfort should disappear within 24 hours.  If you had a scopolamine patch placed behind your ear for the management of post- operative nausea and/or vomiting:  1. The medication in the patch is effective for 72 hours,  after which it should be removed.  Wrap patch in a tissue and discard in the trash. Wash hands thoroughly with soap and water. 2. You may remove the patch earlier than 72 hours if you experience unpleasant side effects which may include dry mouth, dizziness or visual disturbances. 3. Avoid touching the patch. Wash  your hands with soap and water after contact with the patch.   Call your surgeon if you experience:   1.  Fever over 101.0. 2.  Inability to urinate. 3.  Nausea and/or vomiting. 4.  Extreme swelling or bruising at the surgical site. 5.  Continued bleeding from the incision. 6.  Increased pain, redness or drainage from the incision. 7.  Problems related to your pain medication. 8. Any change in color, movement and/or sensation 9. Any problems and/or concerns

## 2015-10-04 NOTE — Anesthesia Postprocedure Evaluation (Signed)
Anesthesia Post Note  Patient: Alexandra Allen  Procedure(s) Performed: Procedure(s) (LRB): REMOVAL PORT-A-CATH (Left)  Patient location during evaluation: PACU Anesthesia Type: MAC Level of consciousness: awake and alert Pain management: pain level controlled Vital Signs Assessment: post-procedure vital signs reviewed and stable Respiratory status: spontaneous breathing Cardiovascular status: stable Anesthetic complications: no    Last Vitals:  Filed Vitals:   10/04/15 1013 10/04/15 1035  BP:  128/72  Pulse: 68 60  Temp:  36.6 C  Resp: 23 16    Last Pain:  Filed Vitals:   10/04/15 1038  PainSc: 2                  Nolon Nations

## 2015-10-04 NOTE — Op Note (Signed)
  PRE-OPERATIVE DIAGNOSIS:  un-needed Port-A-Cath for lymphoma  POST-OPERATIVE DIAGNOSIS:  Same   PROCEDURE:  Procedure(s):  REMOVAL PORT-A-CATH  SURGEON:  Surgeon(s):  Stark Klein, MD  ANESTHESIA:   MAC + local  EBL:   Minimal  SPECIMEN:  None  Complications : none known  Procedure:   Pt was  identified in the holding area and taken to the operating room where she was placed supine on the operating room table.  MAC anesthesia was induced.  The left upper chest was prepped and draped.  The prior incision was anesthetized with local anesthetic.  The incision was opened with a #15 blade.  The subcutaneous tissue was divided with the cautery.  The port was identified and the capsule opened.  The four 2-0 prolene sutures were removed.  The port was then removed and pressure held on the tract.  The catheter appeared intact without evidence of breakage, length was 23 cm.  The wound was inspected for hemostasis, which was achieved with cautery.  The wound was closed with 3-0 vicryl deep dermal interrupted sutures and 4-0 Monocryl running subcuticular suture.  The wound was cleaned, dried, and dressed with dermabond.  The patient was awakened from anesthesia and taken to the PACU in stable condition.  Needle, sponge, and instrument counts are correct.

## 2015-10-04 NOTE — H&P (Signed)
Alexandra Allen is an 54 y.o. female.   Chief Complaint: history of lymphoma HPI: Pt is a 54 yo F dx with lymphoma in January 2016.  She has completed therapy and restaging studies demonstrate no evidence of disease. Pt desires removal.  Past Medical History  Diagnosis Date  . Thyroid disease     multiple nodules  . Vocal cord edema   . 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 History of bronchitis   . Hoarseness   . Lightheaded     occasionally   . Arthritis   . Myalgia   . Joint pain   . Joint swelling   . GERD (gastroesophageal reflux disease)     takes DGL(over the counter meds)  . Urinary frequency   . Skin infection 01/23/2015    back  . Cancer ([place]) 10/2014    large B cell lymphoma  . Hoarseness of voice 08/03/2015  . Adrenal adenoma 08/03/2015    Past Surgical History  Procedure Laterality Date  . Abdominal hysterectomy  2001  . Lymph node biopsy      under left arm and groin biopsy  . Laryngoscopy    . Biopsy thyroid    . Portacath placement Left 11/17/2014    Procedure: INSERTION PORT-A-CATH LEFT SUBCLAVIAN;  Surgeon: Stark Klein, MD;  Location: [facility] OR;  Service: General;  Laterality: Left;    Family History  Problem Relation Age of Onset  . Breast cancer Mother     breast and then died of endometrial cancer  . Hypertension Mother   . Cancer Father     colon, skin and prostate  . Hypertension Father   . Heart disease Father   . Endometrial cancer Mother   . Diabetes Sister     borderline  . Hypertension Sister    Social History:  reports that she has been smoking Cigarettes.  She has a 8.75 pack-year smoking history. She has never used smokeless tobacco. She reports that she drinks alcohol. She reports that she does not use illicit drugs.  Allergies:  Allergies  Allergen Reactions  . Adhesive [Tape]     rash  . Influenza Vaccines Other (See Comments)    Joint pain and aches   . Prednisone Other (See Comments)    Severe Headache   .  Sulfa Antibiotics Rash    Medications Prior to Admission  Medication Sig Dispense Refill  . Glucosamine-Chondroit-Vit C-Mn (GLUCOSAMINE CHONDR 1500 COMPLX PO) Take 1 tablet by mouth daily.     Marland Kitchen HYDROcodone-acetaminophen (NORCO) 7.5-325 MG per tablet Take 7.5 tablets by mouth.  0  . ibuprofen (ADVIL) 200 MG tablet Take 200 mg by mouth every 6 (six) hours as needed.    . Multiple Vitamins-Minerals (MULTIVITAMIN PO) Take by mouth daily.    Marland Kitchen UNABLE TO FIND Take 1 tablet by mouth at bedtime. Med Name: Supplement "DGL"      No results found for this or any previous visit (from the past 48 hour(s)). No results found.  Review of Systems  All other systems reviewed and are negative.   Blood pressure 122/78, pulse 64, temperature 98.2 F (36.8 C), temperature source Oral, resp. rate 18, height 5\' 3"  (1.6 m), weight 71.215 kg (157 lb), SpO2 100 %. Physical Exam  Constitutional: She is oriented to person, place, and time. She appears well-developed and well-nourished. No distress.  HENT:  Head: Normocephalic and atraumatic.  Eyes: Pupils are equal, round, and reactive to light. No scleral icterus.  Cardiovascular:  Normal rate.   Respiratory: Effort normal. She exhibits no tenderness.  Left subclavian port in place  Musculoskeletal: Normal range of motion.  Neurological: She is alert and oriented to person, place, and time.  Skin: Skin is warm and dry. She is not diaphoretic.  Psychiatric: She has a normal mood and affect. Her behavior is normal. Judgment and thought content normal.     Assessment/Plan Lymphoma Plan removal of port a cath. Reviewed risks, benefits.    Alexandra Allen 10/04/2015, 8:46 AM

## 2015-10-04 NOTE — Transfer of Care (Signed)
Immediate Anesthesia Transfer of Care Note  Patient: Alexandra Allen  Procedure(s) Performed: Procedure(s): REMOVAL PORT-A-CATH (Left)  Patient Location: PACU  Anesthesia Type:MAC  Level of Consciousness: awake and patient cooperative  Airway & Oxygen Therapy: Patient Spontanous Breathing and Patient connected to face mask oxygen  Post-op Assessment: Report given to RN and Post -op Vital signs reviewed and stable  Post vital signs: Reviewed and stable  Last Vitals:  Filed Vitals:   10/04/15 0824  BP: 122/78  Pulse: 64  Temp: 36.8 C  Resp: 18    Complications: No apparent anesthesia complications

## 2015-10-04 NOTE — Anesthesia Procedure Notes (Signed)
Procedure Name: MAC Date/Time: 10/04/2015 9:08 AM Performed by: Marrianne Mood Pre-anesthesia Checklist: Patient identified, Timeout performed, Emergency Drugs available, Suction available and Patient being monitored Patient Re-evaluated:Patient Re-evaluated prior to inductionOxygen Delivery Method: Simple face mask

## 2015-10-05 ENCOUNTER — Encounter (HOSPITAL_BASED_OUTPATIENT_CLINIC_OR_DEPARTMENT_OTHER): Payer: Self-pay | Admitting: General Surgery

## 2015-10-10 ENCOUNTER — Ambulatory Visit: Payer: Self-pay | Admitting: Otolaryngology

## 2015-10-10 NOTE — H&P (Signed)
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 Lymphoma (202.80) (C85.90). Hoarseness (784.42) (R49.0). 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 Smokes tobacco daily (305.1) (Z72.0). Discussed  She has been hoarse for at least 12 years. She had vocal polyps identified in 2008 and 2013. She smokes about half a pack per day. She drinks 2 months of coffee daily. She drinks a couple of beers daily. She completed her lymphoma treatments a few months ago. Recent PET scan shows activity in the larynx. I will discuss with the radiologist if this is simply physiologic orifices pathologic. I suspect it is physiologic. She has obvious polyps still but no evidence of any neoplasm in the larynx. We discussed the definitive treatment of this is going to be combination of lifestyle modifications including avoidance of all caffeine, tobacco and alcohol. The second part of the treatment will be microlaryngoscopy with removal of the polypoid material. Reason For Visit  Abnormal PET Scan and hoarseness. HPI  History of hoarseness going back at least 12 years. She has been found to have vocal polyps in 2008 and 2013. She has continued smoking about half pack per day. She recently finished chemotherapy and radiation for lymphoma. She also drinks about 2 mugs of coffee daily and she was drinking wine but now had to switch to beer because of the effects of the chemotherapy. Recent followup CAT scan revealed activity in the larynx which wasn't there on the prior to studies. This was not commented on by the radiologist. I have reviewed the images and it appears to be consistent with normal physiologic laryngeal muscle activity. Allergies  Rec: [id number].  List Reconciled and Reviewed. FLU Latex Sulfa Drugs; Rash Zegerid CAPS * also allergic to flu shot, 31 Oct 2011. Current Meds  Rec: [id number].  List Reconciled and Reviewed. Hydrocodone-Acetaminophen CAPS;; RPT Fish Oil CAPS;; RPT Glucosamine Chondroitin  TABS;; RPT Multi-Vitamin TABS;; RPT Licorice Deglycyrrhizinated Powder;; RPT Advil TABS;; RPT. 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 PSH  Hysterectomy. Family Hx  Cancer Family history of diabetes mellitus: Father (V18.0) (Z83.3) Family history of hypertension: Father (V17.49) (Z59.49) Family history of osteoporosis: Father (V17.81) (Z82.62) Gastric Ulcer. Personal Hx  Alcohol Use (History) Caffeine use (V49.89) (F15.90); 2 cups daily Current every day smoker (305.1) (F17.200). Vital Signs   Recorded by Iran Sizer on 15 Aug 2015 10:10 AM BP:122/84,  BSA Calculated: 1.77 ,  BMI Calculated: 27.02 ,  Weight: 157 lb 6.4 oz, BMI: 27 kg/m2,  Height: 5 ft 4 in. Physical Exam  APPEARANCE: Well developed, well nourished, in no acute distress.  Normal affect, in a pleasant mood.  Oriented to time, place and person. COMMUNICATION: Deep, husky voice   HEAD & FACE:  No scars, lesions or masses of head and face.  Sinuses nontender to palpation.  Salivary glands without mass or tenderness.  Facial strength symmetric.  No facial lesion, scars, or mass. EYES: EOMI with normal primary gaze alignment. Visual acuity grossly intact.  PERRLA EXTERNAL EAR & NOSE: No scars, lesions or masses  EAC & TYMPANIC MEMBRANE:  EAC shows no obstructing lesions or debris and tympanic membranes are normal bilaterally with good movement to insufflation. GROSS HEARING: Normal  TMJ:  Nontender  INTRANASAL EXAM: No polyps or purulence.  NASOPHARYNX: Normal, without lesions. LIPS, TEETH & GUMS: No lip lesions, normal dentition and normal gums. ORAL CAVITY/OROPHARYNX:  Oral mucosa moist without lesion or asymmetry  of the palate, tongue, tonsil or posterior pharynx. LARYNX (mirror exam):  No lesions of the epiglottis,  and cords move well to phonation. There is extensive Reinke space edema  bilaterally with polypoid formation bilaterally. HYPOPHARYNX (mirror exam): No lesions, asymmetry or pooling of secretions. NECK:  Supple without adenopathy or mass. THYROID:  Normal with no masses palpable.  NEUROLOGIC:  No gross CN deficits. No nystagmus noted.   LYMPHATIC:  No enlarged nodes palpable. Signature  Electronically signed by : Izora Gala  M.D.;

## 2015-10-16 ENCOUNTER — Ambulatory Visit (HOSPITAL_BASED_OUTPATIENT_CLINIC_OR_DEPARTMENT_OTHER)
Admission: RE | Admit: 2015-10-16 | Discharge: 2015-10-16 | Disposition: A | Discharge status: Home / Self Care | Payer: BLUE CROSS/BLUE SHIELD | Source: Ambulatory Visit | Attending: Otolaryngology | Admitting: Otolaryngology

## 2015-10-16 ENCOUNTER — Ambulatory Visit (HOSPITAL_BASED_OUTPATIENT_CLINIC_OR_DEPARTMENT_OTHER): Payer: BLUE CROSS/BLUE SHIELD | Admitting: Certified Registered"

## 2015-10-16 ENCOUNTER — Encounter (HOSPITAL_BASED_OUTPATIENT_CLINIC_OR_DEPARTMENT_OTHER): Payer: Self-pay | Admitting: *Deleted

## 2015-10-16 ENCOUNTER — Encounter (HOSPITAL_BASED_OUTPATIENT_CLINIC_OR_DEPARTMENT_OTHER)
Admission: RE | Disposition: A | Discharge status: Home / Self Care | Payer: Self-pay | Source: Ambulatory Visit | Attending: Otolaryngology

## 2015-10-16 DIAGNOSIS — Z800 Family history of malignant neoplasm of digestive organs: Secondary | ICD-10-CM | POA: Diagnosis not present

## 2015-10-16 DIAGNOSIS — Z8042 Family history of malignant neoplasm of prostate: Secondary | ICD-10-CM | POA: Insufficient documentation

## 2015-10-16 DIAGNOSIS — K219 Gastro-esophageal reflux disease without esophagitis: Secondary | ICD-10-CM | POA: Insufficient documentation

## 2015-10-16 DIAGNOSIS — Z451 Encounter for adjustment and management of infusion pump: Secondary | ICD-10-CM | POA: Diagnosis present

## 2015-10-16 DIAGNOSIS — M1990 Unspecified osteoarthritis, unspecified site: Secondary | ICD-10-CM | POA: Diagnosis not present

## 2015-10-16 DIAGNOSIS — C8330 Diffuse large B-cell lymphoma, unspecified site: Secondary | ICD-10-CM | POA: Insufficient documentation

## 2015-10-16 DIAGNOSIS — Z808 Family history of malignant neoplasm of other organs or systems: Secondary | ICD-10-CM | POA: Diagnosis not present

## 2015-10-16 DIAGNOSIS — Z887 Allergy status to serum and vaccine status: Secondary | ICD-10-CM | POA: Insufficient documentation

## 2015-10-16 DIAGNOSIS — Z90710 Acquired absence of both cervix and uterus: Secondary | ICD-10-CM | POA: Diagnosis not present

## 2015-10-16 DIAGNOSIS — Z79899 Other long term (current) drug therapy: Secondary | ICD-10-CM | POA: Insufficient documentation

## 2015-10-16 DIAGNOSIS — Z881 Allergy status to other antibiotic agents status: Secondary | ICD-10-CM | POA: Insufficient documentation

## 2015-10-16 DIAGNOSIS — Z803 Family history of malignant neoplasm of breast: Secondary | ICD-10-CM | POA: Diagnosis not present

## 2015-10-16 DIAGNOSIS — F17200 Nicotine dependence, unspecified, uncomplicated: Secondary | ICD-10-CM | POA: Diagnosis not present

## 2015-10-16 HISTORY — PX: MICROLARYNGOSCOPY: SHX5208

## 2015-10-16 SURGERY — MICROLARYNGOSCOPY
Anesthesia: General | Site: Throat | Laterality: Bilateral

## 2015-10-16 MED ORDER — LABETALOL HCL 5 MG/ML IV SOLN
INTRAVENOUS | Status: AC
  Filled 2015-10-16: qty 4

## 2015-10-16 MED ORDER — MIDAZOLAM HCL 2 MG/2ML IJ SOLN
1.0000 mg | INTRAMUSCULAR | Status: DC | PRN
  Administered 2015-10-16: 2 mg via INTRAVENOUS

## 2015-10-16 MED ORDER — PROPOFOL 10 MG/ML IV BOLUS
INTRAVENOUS | Status: DC | PRN
  Administered 2015-10-16: 100 mg via INTRAVENOUS
  Administered 2015-10-16: 200 mg via INTRAVENOUS

## 2015-10-16 MED ORDER — LIDOCAINE-EPINEPHRINE 1 %-1:100000 IJ SOLN
INTRAMUSCULAR | Status: DC | PRN
  Administered 2015-10-16: .8 mL

## 2015-10-16 MED ORDER — SCOPOLAMINE 1 MG/3DAYS TD PT72: 1.0000 | MEDICATED_PATCH | Freq: Once | TRANSDERMAL | Status: DC | PRN

## 2015-10-16 MED ORDER — FENTANYL CITRATE (PF) 100 MCG/2ML IJ SOLN
INTRAMUSCULAR | Status: AC
  Filled 2015-10-16: qty 2

## 2015-10-16 MED ORDER — ONDANSETRON HCL 4 MG/2ML IJ SOLN
INTRAMUSCULAR | Status: DC | PRN
  Administered 2015-10-16: 4 mg via INTRAVENOUS

## 2015-10-16 MED ORDER — SUCCINYLCHOLINE CHLORIDE 20 MG/ML IJ SOLN
INTRAMUSCULAR | Status: AC
  Filled 2015-10-16: qty 1

## 2015-10-16 MED ORDER — ARTIFICIAL TEARS OP OINT
TOPICAL_OINTMENT | OPHTHALMIC | Status: AC
  Filled 2015-10-16: qty 3.5

## 2015-10-16 MED ORDER — GLYCOPYRROLATE 0.2 MG/ML IJ SOLN
0.2000 mg | Freq: Once | INTRAMUSCULAR | Status: DC | PRN
Start: 2015-10-16 — End: 2015-10-16

## 2015-10-16 MED ORDER — EPINEPHRINE HCL 1 MG/ML IJ SOLN
INTRAMUSCULAR | Status: AC
  Filled 2015-10-16: qty 1

## 2015-10-16 MED ORDER — LIDOCAINE HCL (CARDIAC) 20 MG/ML IV SOLN
INTRAVENOUS | Status: AC
  Filled 2015-10-16: qty 5

## 2015-10-16 MED ORDER — HYDROMORPHONE HCL 1 MG/ML IJ SOLN
INTRAMUSCULAR | Status: AC
Start: 2015-10-16 — End: 2015-10-16
  Filled 2015-10-16: qty 1

## 2015-10-16 MED ORDER — OXYCODONE HCL 5 MG PO TABS
5.0000 mg | ORAL_TABLET | Freq: Once | ORAL | Status: AC | PRN
  Administered 2015-10-16: 5 mg via ORAL

## 2015-10-16 MED ORDER — EPINEPHRINE HCL 1 MG/ML IJ SOLN
INTRAMUSCULAR | Status: DC | PRN
  Administered 2015-10-16: 2 mL

## 2015-10-16 MED ORDER — LABETALOL HCL 5 MG/ML IV SOLN
INTRAVENOUS | Status: DC | PRN
  Administered 2015-10-16: 5 mg via INTRAVENOUS

## 2015-10-16 MED ORDER — DEXAMETHASONE SODIUM PHOSPHATE 10 MG/ML IJ SOLN
INTRAMUSCULAR | Status: AC
  Filled 2015-10-16: qty 1

## 2015-10-16 MED ORDER — MIDAZOLAM HCL 2 MG/2ML IJ SOLN
INTRAMUSCULAR | Status: AC
  Filled 2015-10-16: qty 2

## 2015-10-16 MED ORDER — MEPERIDINE HCL 25 MG/ML IJ SOLN: 6.2500 mg | INTRAMUSCULAR | Status: DC | PRN

## 2015-10-16 MED ORDER — SUCCINYLCHOLINE CHLORIDE 20 MG/ML IJ SOLN
INTRAMUSCULAR | Status: DC | PRN
  Administered 2015-10-16: 80 mg via INTRAVENOUS

## 2015-10-16 MED ORDER — FENTANYL CITRATE (PF) 100 MCG/2ML IJ SOLN
50.0000 ug | INTRAMUSCULAR | Status: DC | PRN
  Administered 2015-10-16: 100 ug via INTRAVENOUS

## 2015-10-16 MED ORDER — OXYCODONE HCL 5 MG PO TABS
ORAL_TABLET | ORAL | Status: AC
  Filled 2015-10-16: qty 1

## 2015-10-16 MED ORDER — LIDOCAINE HCL (CARDIAC) 20 MG/ML IV SOLN
INTRAVENOUS | Status: DC | PRN
  Administered 2015-10-16: 50 mg via INTRAVENOUS

## 2015-10-16 MED ORDER — LIDOCAINE-EPINEPHRINE 1 %-1:100000 IJ SOLN
INTRAMUSCULAR | Status: AC
  Filled 2015-10-16: qty 1

## 2015-10-16 MED ORDER — OXYCODONE HCL 5 MG/5ML PO SOLN: 5.0000 mg | Freq: Once | ORAL | Status: AC | PRN

## 2015-10-16 MED ORDER — LACTATED RINGERS IV SOLN
INTRAVENOUS | Status: DC
  Administered 2015-10-16: 10 mL/h via INTRAVENOUS
  Administered 2015-10-16: 07:00:00 via INTRAVENOUS

## 2015-10-16 MED ORDER — HYDROMORPHONE HCL 1 MG/ML IJ SOLN
0.2500 mg | INTRAMUSCULAR | Status: DC | PRN
  Administered 2015-10-16: 0.5 mg via INTRAVENOUS
  Administered 2015-10-16 (×2): 0.25 mg via INTRAVENOUS

## 2015-10-16 MED ORDER — ONDANSETRON HCL 4 MG/2ML IJ SOLN
INTRAMUSCULAR | Status: AC
  Filled 2015-10-16: qty 2

## 2015-10-16 SURGICAL SUPPLY — 25 items
CANISTER SUCT 1200ML W/VALVE (MISCELLANEOUS) ×2 IMPLANT
GLOVE ECLIPSE 7.5 STRL STRAW (GLOVE) ×2 IMPLANT
GLOVE SURG SS PI 7.0 STRL IVOR (GLOVE) ×1 IMPLANT
GOWN STRL REUS W/ TWL LRG LVL3 (GOWN DISPOSABLE) IMPLANT
GOWN STRL REUS W/ TWL XL LVL3 (GOWN DISPOSABLE) IMPLANT
GOWN STRL REUS W/TWL LRG LVL3 (GOWN DISPOSABLE) ×2
GOWN STRL REUS W/TWL XL LVL3 (GOWN DISPOSABLE)
GUARD TEETH (MISCELLANEOUS) ×1 IMPLANT
MARKER SKIN DUAL TIP RULER LAB (MISCELLANEOUS) ×1 IMPLANT
NDL HYPO 18GX1.5 BLUNT FILL (NEEDLE) ×1 IMPLANT
NDL SPNL 22GX7 QUINCKE BK (NEEDLE) IMPLANT
NEEDLE HYPO 18GX1.5 BLUNT FILL (NEEDLE) ×2 IMPLANT
NEEDLE SPNL 22GX7 QUINCKE BK (NEEDLE) ×2 IMPLANT
NS IRRIG 1000ML POUR BTL (IV SOLUTION) ×2 IMPLANT
PAD ALCOHOL SWAB (MISCELLANEOUS) ×2 IMPLANT
PATTIES SURGICAL .5 X3 (DISPOSABLE) ×2 IMPLANT
SHEET MEDIUM DRAPE 40X70 STRL (DRAPES) ×2 IMPLANT
SLEEVE SCD COMPRESS KNEE MED (MISCELLANEOUS) ×1 IMPLANT
SOLUTION BUTLER CLEAR DIP (MISCELLANEOUS) ×2 IMPLANT
SPONGE GAUZE 4X4 12PLY STER LF (GAUZE/BANDAGES/DRESSINGS) ×4 IMPLANT
SYR 5ML LL (SYRINGE) ×2 IMPLANT
SYR CONTROL 10ML LL (SYRINGE) IMPLANT
SYR TB 1ML LL NO SAFETY (SYRINGE) ×1 IMPLANT
TOWEL OR 17X24 6PK STRL BLUE (TOWEL DISPOSABLE) ×2 IMPLANT
TUBE CONNECTING 20X1/4 (TUBING) ×2 IMPLANT

## 2015-10-16 NOTE — Op Note (Signed)
OPERATIVE REPORT  DATE OF SURGERY: 10/16/2015  PATIENT:  Alexandra Allen,  54 y.o. female  PRE-OPERATIVE DIAGNOSIS:  VOCAL CORD POLYP  POST-OPERATIVE DIAGNOSIS:  VOCAL CORD POLYP  PROCEDURE:  Procedure(s): MICROLARYNGOSCOPY WITH REMOVAL OF VOCAL CORD POLYP   SURGEON:  Beckie Salts, MD  ASSISTANTS: none  ANESTHESIA:   General   EBL:  2 ml  DRAINS: none  LOCAL MEDICATIONS USED:  None  SPECIMEN:  I lateral vocal cord polyps, sent separately  COUNTS:  Correct  PROCEDURE DETAILS: The patient was taken to the operating room and placed on the operating table in the supine position. Following induction of general endotracheal anesthesia, the table was turned 90 and the patient was draped in a standard fashion.  A maxillary tooth protector was used throughout the case. A Jako laryngoscope was entered into the oral cavity used to visualize the larynx and secured to the Mayo stand with the suspension apparatus. The larynx was inspected and the operating microscope was brought into view. Bilateral polyps were present. The cords were injected bilaterally with Xylocaine with epinephrine solution using a spinal needle. Superior mucosal incisions were created using up-biting micro-laryngoscopy scissors. Large amount of thick gelatinous material was suctioned out of Reinke space bilaterally. Excess mucosa was removed from the floor of the ventricle and the mucosa was allowed to lay down on the vocal ligament. On the left side there was a second polyp on the contacting surface of the cord that was treated in a similar fashion. Topical adrenaline was applied with pledgets. Photographs were taken pre-and post removal. Scope was removed and the patient was awakened extubated and transferred to recovery in stable condition.    PATIENT DISPOSITION:  To PACU, stable

## 2015-10-16 NOTE — Anesthesia Preprocedure Evaluation (Signed)
Anesthesia Evaluation  Patient identified by MRN, date of birth, ID band Patient awake    Reviewed: Allergy & Precautions, NPO status , Patient's Chart, lab work & pertinent test results  Airway Mallampati: I  TM Distance: >3 FB Neck ROM: Limited    Dental  (+) Teeth Intact, Dental Advisory Given   Pulmonary Current Smoker,    breath sounds clear to auscultation       Cardiovascular  Rhythm:Regular Rate:Normal     Neuro/Psych    GI/Hepatic GERD  Medicated,  Endo/Other    Renal/GU      Musculoskeletal   Abdominal   Peds  Hematology   Anesthesia Other Findings   Reproductive/Obstetrics                             Anesthesia Physical Anesthesia Plan  ASA: II  Anesthesia Plan: General   Post-op Pain Management:    Induction: Intravenous  Airway Management Planned: Oral ETT  Additional Equipment:   Intra-op Plan:   Post-operative Plan: Extubation in OR  Informed Consent: I have reviewed the patients History and Physical, chart, labs and discussed the procedure including the risks, benefits and alternatives for the proposed anesthesia with the patient or authorized representative who has indicated his/her understanding and acceptance.   Dental advisory given  Plan Discussed with: CRNA, Anesthesiologist and Surgeon  Anesthesia Plan Comments:         Anesthesia Quick Evaluation

## 2015-10-16 NOTE — Transfer of Care (Signed)
Immediate Anesthesia Transfer of Care Note  Patient: Alexandra Allen  Procedure(s) Performed: Procedure(s): MICROLARYNGOSCOPY WITH REMOVAL OF VOCAL CORD POLYP  (Bilateral)  Patient Location: PACU  Anesthesia Type:General  Level of Consciousness: sedated  Airway & Oxygen Therapy: Patient Spontanous Breathing and aerosol face mask  Post-op Assessment: Report given to RN and Post -op Vital signs reviewed and stable  Post vital signs: Reviewed and stable  Last Vitals:  Filed Vitals:   10/16/15 0635  BP: 124/81  Pulse: 70  Temp: 36.8 C  Resp: 16    Complications: No apparent anesthesia complications

## 2015-10-16 NOTE — Anesthesia Procedure Notes (Signed)
Procedure Name: Intubation Performed by: Tawni Millers Pre-anesthesia Checklist: Patient identified, Emergency Drugs available, Suction available and Patient being monitored Patient Re-evaluated:Patient Re-evaluated prior to inductionOxygen Delivery Method: Circle System Utilized Preoxygenation: Pre-oxygenation with 100% oxygen Intubation Type: IV induction Ventilation: Mask ventilation without difficulty Laryngoscope Size: Mac and 3 Tube type: Oral Tube size: 6.0 mm Number of attempts: 1 Airway Equipment and Method: Stylet and Oral airway Placement Confirmation: ETT inserted through vocal cords under direct vision,  positive ETCO2 and breath sounds checked- equal and bilateral Tube secured with: Tape Dental Injury: Teeth and Oropharynx as per pre-operative assessment

## 2015-10-16 NOTE — H&P (View-Only) (Signed)
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 Lymphoma (202.80) (C85.90). Hoarseness (784.42) (R49.0). 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 Smokes tobacco daily (305.1) (Z72.0). Discussed  She has been hoarse for at least 12 years. She had vocal polyps identified in 2008 and 2013. She smokes about half a pack per day. She drinks 2 months of coffee daily. She drinks a couple of beers daily. She completed her lymphoma treatments a few months ago. Recent PET scan shows activity in the larynx. I will discuss with the radiologist if this is simply physiologic orifices pathologic. I suspect it is physiologic. She has obvious polyps still but no evidence of any neoplasm in the larynx. We discussed the definitive treatment of this is going to be combination of lifestyle modifications including avoidance of all caffeine, tobacco and alcohol. The second part of the treatment will be microlaryngoscopy with removal of the polypoid material. Reason For Visit  Abnormal PET Scan and hoarseness. HPI  History of hoarseness going back at least 12 years. She has been found to have vocal polyps in 2008 and 2013. She has continued smoking about half pack per day. She recently finished chemotherapy and radiation for lymphoma. She also drinks about 2 mugs of coffee daily and she was drinking wine but now had to switch to beer because of the effects of the chemotherapy. Recent followup CAT scan revealed activity in the larynx which wasn't there on the prior to studies. This was not commented on by the radiologist. I have reviewed the images and it appears to be consistent with normal physiologic laryngeal muscle activity. Allergies  Rec: [id number].  List Reconciled and Reviewed. FLU Latex Sulfa Drugs; Rash Zegerid CAPS * also allergic to flu shot, 31 Oct 2011. Current Meds  Rec: [id number].  List Reconciled and Reviewed. Hydrocodone-Acetaminophen CAPS;; RPT Fish Oil CAPS;; RPT Glucosamine Chondroitin  TABS;; RPT Multi-Vitamin TABS;; RPT Licorice Deglycyrrhizinated Powder;; RPT Advil TABS;; RPT. 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 PSH  Hysterectomy. Family Hx  Cancer Family history of diabetes mellitus: Father (V18.0) (Z83.3) Family history of hypertension: Father (V17.49) (Z21.49) Family history of osteoporosis: Father (V17.81) (Z82.62) Gastric Ulcer. Personal Hx  Alcohol Use (History) Caffeine use (V49.89) (F15.90); 2 cups daily Current every day smoker (305.1) (F17.200). Vital Signs   Recorded by Iran Sizer on 15 Aug 2015 10:10 AM BP:122/84,  BSA Calculated: 1.77 ,  BMI Calculated: 27.02 ,  Weight: 157 lb 6.4 oz, BMI: 27 kg/m2,  Height: 5 ft 4 in. Physical Exam  APPEARANCE: Well developed, well nourished, in no acute distress.  Normal affect, in a pleasant mood.  Oriented to time, place and person. COMMUNICATION: Deep, husky voice   HEAD & FACE:  No scars, lesions or masses of head and face.  Sinuses nontender to palpation.  Salivary glands without mass or tenderness.  Facial strength symmetric.  No facial lesion, scars, or mass. EYES: EOMI with normal primary gaze alignment. Visual acuity grossly intact.  PERRLA EXTERNAL EAR & NOSE: No scars, lesions or masses  EAC & TYMPANIC MEMBRANE:  EAC shows no obstructing lesions or debris and tympanic membranes are normal bilaterally with good movement to insufflation. GROSS HEARING: Normal  TMJ:  Nontender  INTRANASAL EXAM: No polyps or purulence.  NASOPHARYNX: Normal, without lesions. LIPS, TEETH & GUMS: No lip lesions, normal dentition and normal gums. ORAL CAVITY/OROPHARYNX:  Oral mucosa moist without lesion or asymmetry  of the palate, tongue, tonsil or posterior pharynx. LARYNX (mirror exam):  No lesions of the epiglottis,  and cords move well to phonation. There is extensive Reinke space edema  bilaterally with polypoid formation bilaterally. HYPOPHARYNX (mirror exam): No lesions, asymmetry or pooling of secretions. NECK:  Supple without adenopathy or mass. THYROID:  Normal with no masses palpable.  NEUROLOGIC:  No gross CN deficits. No nystagmus noted.   LYMPHATIC:  No enlarged nodes palpable. Signature  Electronically signed by : Izora Gala  M.D.;

## 2015-10-16 NOTE — Interval H&P Note (Signed)
History and Physical Interval Note:  10/16/2015 7:18 AM  Alexandra Allen  has presented today for surgery, with the diagnosis of VOCAL CORD POLYP  The various methods of treatment have been discussed with the patient and family. After consideration of risks, benefits and other options for treatment, the patient has consented to  Procedure(s): MICROLARYNGOSCOPY WITH REMOVAL OF VOCAL CORD POLYP  (N/A) as a surgical intervention .  The patient's history has been reviewed, patient examined, no change in status, stable for surgery.  I have reviewed the patient's chart and labs.  Questions were answered to the patient's satisfaction.     Julene Rahn

## 2015-10-16 NOTE — Discharge Instructions (Signed)
Use Tylenol and/or Motrin for any discomfort. Use your prescription pain medicine if needed.  It is okay to talk and a nice easily relaxed gentle voice.  Avoid screaming, whispering, vocal straining. Avoid excessive talking. Continued to cut back on tobacco and caffeine.    Post Anesthesia Home Care Instructions  Activity: Get plenty of rest for the remainder of the day. A responsible adult should stay with you for 24 hours following the procedure.  For the next 24 hours, DO NOT: -Drive a car -Paediatric nurse -Drink alcoholic beverages -Take any medication unless instructed by your physician -Make any legal decisions or sign important papers.  Meals: Start with liquid foods such as gelatin or soup. Progress to regular foods as tolerated. Avoid greasy, spicy, heavy foods. If nausea and/or vomiting occur, drink only clear liquids until the nausea and/or vomiting subsides. Call your physician if vomiting continues.  Special Instructions/Symptoms: Your throat may feel dry or sore from the anesthesia or the breathing tube placed in your throat during surgery. If this causes discomfort, gargle with warm salt water. The discomfort should disappear within 24 hours.  If you had a scopolamine patch placed behind your ear for the management of post- operative nausea and/or vomiting:  1. The medication in the patch is effective for 72 hours, after which it should be removed.  Wrap patch in a tissue and discard in the trash. Wash hands thoroughly with soap and water. 2. You may remove the patch earlier than 72 hours if you experience unpleasant side effects which may include dry mouth, dizziness or visual disturbances. 3. Avoid touching the patch. Wash your hands with soap and water after contact with the patch.    Call your surgeon if you experience:   1.  Fever over 101.0. 2.  Inability to urinate. 3.  Nausea and/or vomiting. 4.  Extreme swelling or bruising at the surgical site. 5.   Continued bleeding from the incision. 6.  Increased pain, redness or drainage from the incision. 7.  Problems related to your pain medication. 8. Any change in color, movement and/or sensation 9. Any problems and/or concerns

## 2015-10-16 NOTE — Anesthesia Postprocedure Evaluation (Signed)
Anesthesia Post Note  Patient: Alexandra Allen  Procedure(s) Performed: Procedure(s) (LRB): MICROLARYNGOSCOPY WITH REMOVAL OF VOCAL CORD POLYP  (Bilateral)  Patient location during evaluation: PACU Anesthesia Type: General Level of consciousness: awake and alert Pain management: pain level controlled Vital Signs Assessment: post-procedure vital signs reviewed and stable Respiratory status: spontaneous breathing, nonlabored ventilation and respiratory function stable Cardiovascular status: blood pressure returned to baseline and stable Postop Assessment: no signs of nausea or vomiting Anesthetic complications: no    Last Vitals:  Filed Vitals:   10/16/15 0830 10/16/15 0845  BP: 99/61   Pulse: 78 70  Temp:    Resp: 23 14    Last Pain:  Filed Vitals:   10/16/15 0853  PainSc: 0-No pain                 Machele Deihl A

## 2015-10-17 ENCOUNTER — Encounter (HOSPITAL_BASED_OUTPATIENT_CLINIC_OR_DEPARTMENT_OTHER): Payer: Self-pay | Admitting: Otolaryngology

## 2015-10-29 HISTORY — PX: UPPER GASTROINTESTINAL ENDOSCOPY: SHX188

## 2015-10-29 HISTORY — PX: COLONOSCOPY: SHX174

## 2016-02-14 ENCOUNTER — Other Ambulatory Visit: Payer: Self-pay | Admitting: Hematology and Oncology

## 2016-02-14 DIAGNOSIS — Z8572 Personal history of non-Hodgkin lymphomas: Secondary | ICD-10-CM

## 2016-02-15 ENCOUNTER — Encounter: Payer: Self-pay | Admitting: Hematology and Oncology

## 2016-02-15 ENCOUNTER — Other Ambulatory Visit (HOSPITAL_BASED_OUTPATIENT_CLINIC_OR_DEPARTMENT_OTHER): Payer: BLUE CROSS/BLUE SHIELD

## 2016-02-15 ENCOUNTER — Ambulatory Visit (HOSPITAL_BASED_OUTPATIENT_CLINIC_OR_DEPARTMENT_OTHER): Payer: BLUE CROSS/BLUE SHIELD | Admitting: Hematology and Oncology

## 2016-02-15 ENCOUNTER — Telehealth: Payer: Self-pay | Admitting: Hematology and Oncology

## 2016-02-15 VITALS — BP 130/69 | HR 58 | Temp 98.1°F | Resp 18 | Ht 63.0 in | Wt 158.8 lb

## 2016-02-15 DIAGNOSIS — Z720 Tobacco use: Secondary | ICD-10-CM | POA: Diagnosis not present

## 2016-02-15 DIAGNOSIS — Z8572 Personal history of non-Hodgkin lymphomas: Secondary | ICD-10-CM | POA: Diagnosis not present

## 2016-02-15 DIAGNOSIS — D3500 Benign neoplasm of unspecified adrenal gland: Secondary | ICD-10-CM | POA: Diagnosis not present

## 2016-02-15 LAB — CBC WITH DIFFERENTIAL/PLATELET
BASO%: 1.4 % (ref 0.0–2.0)
Basophils Absolute: 0.1 10*3/uL (ref 0.0–0.1)
EOS%: 3.5 % (ref 0.0–7.0)
Eosinophils Absolute: 0.2 10*3/uL (ref 0.0–0.5)
HCT: 44.4 % (ref 34.8–46.6)
HEMOGLOBIN: 14.7 g/dL (ref 11.6–15.9)
LYMPH%: 27 % (ref 14.0–49.7)
MCH: 32.1 pg (ref 25.1–34.0)
MCHC: 33.2 g/dL (ref 31.5–36.0)
MCV: 96.8 fL (ref 79.5–101.0)
MONO#: 0.3 10*3/uL (ref 0.1–0.9)
MONO%: 6.4 % (ref 0.0–14.0)
NEUT%: 61.7 % (ref 38.4–76.8)
NEUTROS ABS: 2.9 10*3/uL (ref 1.5–6.5)
PLATELETS: 270 10*3/uL (ref 145–400)
RBC: 4.59 10*6/uL (ref 3.70–5.45)
RDW: 14.4 % (ref 11.2–14.5)
WBC: 4.6 10*3/uL (ref 3.9–10.3)
lymph#: 1.3 10*3/uL (ref 0.9–3.3)

## 2016-02-15 LAB — COMPREHENSIVE METABOLIC PANEL
ALBUMIN: 3.9 g/dL (ref 3.5–5.0)
ALT: 31 U/L (ref 0–55)
AST: 23 U/L (ref 5–34)
Alkaline Phosphatase: 53 U/L (ref 40–150)
Anion Gap: 8 mEq/L (ref 3–11)
BILIRUBIN TOTAL: 0.51 mg/dL (ref 0.20–1.20)
BUN: 11.9 mg/dL (ref 7.0–26.0)
CO2: 26 meq/L (ref 22–29)
CREATININE: 0.8 mg/dL (ref 0.6–1.1)
Calcium: 9.7 mg/dL (ref 8.4–10.4)
Chloride: 107 mEq/L (ref 98–109)
EGFR: 83 mL/min/{1.73_m2} — ABNORMAL LOW (ref >90)
GLUCOSE: 97 mg/dL (ref 70–140)
Potassium: 4.4 mEq/L (ref 3.5–5.1)
SODIUM: 141 meq/L (ref 136–145)
TOTAL PROTEIN: 7.2 g/dL (ref 6.4–8.3)

## 2016-02-15 LAB — LACTATE DEHYDROGENASE: LDH: 164 U/L (ref 125–245)

## 2016-02-15 NOTE — Assessment & Plan Note (Signed)
She is undergoing extensive workup by endocrinologist. I will defer to her for further management She complained of bone aches. Could be related to high cortisol. Defer to endocrinologist

## 2016-02-15 NOTE — Assessment & Plan Note (Signed)
PET CT scan in 2016 showed no evidence of residual lymphoma. Imaging study detected abnormalities around the vocal cord. I will send her to ENT. She had polyp removed from vocal cord She has no evidence of cancer. I will see her in 6 months with repeat history, physical examination and blood work

## 2016-02-15 NOTE — Assessment & Plan Note (Signed)
I spent some time counseling the patient the importance of tobacco cessation. She is currently not interested to quit now. 

## 2016-02-15 NOTE — Progress Notes (Signed)
[facility] OFFICE PROGRESS NOTE  Patient Care Team: Maurice Small, MD as PCP - General (Family Medicine) Stark Klein, MD as Consulting Physician (General Surgery)  SUMMARY OF ONCOLOGIC HISTORY: Oncology History   Diffuse large B cell lymphoma   Staging form: Lymphoid Neoplasms, AJCC 6th Edition     Clinical stage from 12/01/2014: Stage I - Signed by Heath Lark, MD on 12/01/2014        History of B-cell lymphoma   09/08/2014 Imaging Ultrasound pelvis detected abnormal soft tissue mass in the left groin   09/13/2014 Imaging Ultrasound abdomen showed up to 7 cm mass-like area of mild hypoechogenicity in the left hepatic lobe   09/21/2014 Imaging MRI of the abdomen showed 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   11/23/2014 Bone Marrow Biopsy Accession: [id number] is negative for lymphoma   11/30/2014 - 01/11/2015 Chemotherapy She received chemotherapy with RCHOP X 3 cycles   11/30/2014 Adverse Reaction Treatment was complicated by mild hypersensitivity reaction to rituximab   01/30/2015 Imaging Repeat PET CT scan showed complete response to the lymphadenopathy.   02/23/2015 - 03/22/2015 Radiation Therapy She received radiation therapy 36 Gy in 20 fractions to left groin area   08/01/2015 Imaging PET scan showed complete response to Rx    INTERVAL HISTORY: Please see below for problem oriented charting. She feels well apart from mild bone aches. She is undergoing evaluation by endocrinologist with high cortisol level. She had ENT evaluation last year with benign vocal cord polyp removed. Her hoarseness is less No recent infection or new  lymphadenopathy  REVIEW OF SYSTEMS:   Constitutional: Denies fevers, chills or abnormal weight loss Eyes: Denies blurriness of vision Ears, nose, mouth, throat, and face: Denies mucositis or sore throat Respiratory: Denies cough, dyspnea or wheezes Cardiovascular: Denies palpitation, chest discomfort or lower extremity swelling Gastrointestinal:  Denies nausea, heartburn or change in bowel habits Skin: Denies abnormal skin rashes Lymphatics: Denies new lymphadenopathy or easy bruising Neurological:Denies numbness, tingling or new weaknesses Behavioral/Psych: Mood is stable, no new changes  All other systems were reviewed with the patient and are negative.  I have reviewed the past medical history, past surgical history, social history and family history with the patient and they are unchanged from previous note.  ALLERGIES:  is allergic to adhesive; influenza vaccines; prednisone; and sulfa antibiotics.  MEDICATIONS:  Current Outpatient Prescriptions  Medication Sig Dispense Refill  . Glucosamine-Chondroit-Vit C-Mn (GLUCOSAMINE CHONDR 1500 COMPLX PO) Take 1 tablet by mouth daily.     Marland Kitchen HYDROcodone-acetaminophen (NORCO) 7.5-325 MG tablet Take 7.5 tablets by mouth every 3 (three) hours as needed for moderate pain. 20 tablet 0  . ibuprofen (ADVIL) 200 MG tablet Take 200 mg by mouth every 6 (six) hours as needed.    . Multiple Vitamins-Minerals (MULTIVITAMIN PO) Take by mouth daily.    . Omega-3 Fatty Acids (FISH OIL PO) Take by mouth.    Marland Kitchen UNABLE TO FIND Take 1 tablet by mouth at bedtime. Med Name: Supplement "DGL"     No current facility-administered medications for this visit.    PHYSICAL EXAMINATION: ECOG PERFORMANCE STATUS: 0 - Asymptomatic  Filed Vitals:   02/15/16 1040  BP: 130/69  Pulse: 58  Temp: 98.1  F (36.7 C)  Resp: 18   Filed Weights   02/15/16 1040  Weight: 158 lb 12.8 oz (72.031 kg)    GENERAL:alert, no distress and comfortable SKIN: skin color, texture,  turgor are normal, no rashes or significant lesions EYES: normal, Conjunctiva are pink and non-injected, sclera clear OROPHARYNX:no exudate, no erythema and lips, buccal mucosa, and tongue normal  NECK: supple, thyroid normal size, non-tender, without nodularity LYMPH:  no palpable lymphadenopathy in the cervical, axillary or inguinal LUNGS: clear to auscultation and percussion with normal breathing effort HEART: regular rate & rhythm and no murmurs and no lower extremity edema ABDOMEN:abdomen soft, non-tender and normal bowel sounds Musculoskeletal:no cyanosis of digits and no clubbing  NEURO: alert & oriented x 3 with fluent speech, no focal motor/sensory deficits  LABORATORY DATA:  I have reviewed the data as listed    Component Value Date/Time   NA 141 02/15/2016 1013   NA 141 11/16/2014 0949   K 4.4 02/15/2016 1013   K 4.5 11/16/2014 0949   CL 103 11/16/2014 0949   CO2 26 02/15/2016 1013   CO2 27 11/16/2014 0949   GLUCOSE 97 02/15/2016 1013   GLUCOSE 101* 11/16/2014 0949   BUN 11.9 02/15/2016 1013   BUN 12 11/16/2014 0949   CREATININE 0.8 02/15/2016 1013   CREATININE 0.87 11/16/2014 0949   CALCIUM 9.7 02/15/2016 1013   CALCIUM 10.0 11/16/2014 0949   PROT 7.2 02/15/2016 1013   PROT 6.8 11/16/2014 0949   ALBUMIN 3.9 02/15/2016 1013   ALBUMIN 4.0 11/16/2014 0949   AST 23 02/15/2016 1013   AST 23 11/16/2014 0949   ALT 31 02/15/2016 1013   ALT 23 11/16/2014 0949   ALKPHOS 53 02/15/2016 1013   ALKPHOS 40 11/16/2014 0949   BILITOT 0.51 02/15/2016 1013   BILITOT 0.6 11/16/2014 0949   GFRNONAA 76* 11/16/2014 0949   GFRAA 88* 11/16/2014 0949    No results found for: SPEP, UPEP  Lab Results  Component Value Date   WBC 4.6 02/15/2016   NEUTROABS 2.9 02/15/2016   HGB 14.7 02/15/2016   HCT 44.4 02/15/2016   MCV 96.8 02/15/2016   PLT 270 02/15/2016      Chemistry      Component Value Date/Time   NA 141 02/15/2016 1013   NA 141 11/16/2014 0949   K 4.4 02/15/2016  1013   K 4.5 11/16/2014 0949   CL 103 11/16/2014 0949   CO2 26 02/15/2016 1013   CO2 27 11/16/2014 0949   BUN 11.9 02/15/2016 1013   BUN 12 11/16/2014 0949   CREATININE 0.8 02/15/2016 1013   CREATININE 0.87 11/16/2014 0949      Component Value Date/Time   CALCIUM 9.7 02/15/2016 1013   CALCIUM 10.0 11/16/2014 0949   ALKPHOS 53 02/15/2016 1013   ALKPHOS 40 11/16/2014 0949   AST 23 02/15/2016 1013   AST 23 11/16/2014 0949   ALT 31 02/15/2016 1013   ALT 23 11/16/2014 0949   BILITOT 0.51 02/15/2016 1013   BILITOT 0.6 11/16/2014 0949      ASSESSMENT & PLAN:  History of B-cell lymphoma PET CT scan in 2016 showed no evidence of residual lymphoma. Imaging study detected abnormalities around the vocal cord. I will send her to ENT. She had polyp removed from vocal cord She has no evidence of cancer. I will see her in 6 months with repeat history, physical examination and blood work  Tobacco abuse I spent some time counseling the patient the importance of tobacco  cessation. She is currently not interested to quit now.     Adrenal adenoma She is undergoing extensive workup by endocrinologist. I will defer to her for further management She complained of bone aches. Could be related to high cortisol. Defer to endocrinologist   Orders Placed This Encounter  Procedures  . CBC with Differential/Platelet    Standing Status: Future     Number of Occurrences:      Standing Expiration Date: 03/21/2017  . Comprehensive metabolic panel    Standing Status: Future     Number of Occurrences:      Standing Expiration Date: 03/21/2017   All questions were answered. The patient knows to call the clinic with any problems, questions or concerns. No barriers to learning was detected. I spent 15 minutes counseling the patient face to face. The total time spent in the appointment was 20 minutes and more than 50% was on counseling and review of test results     [facility], [place], MD 02/15/2016 3:56  PM

## 2016-02-15 NOTE — Telephone Encounter (Signed)
Gave pt appt for oct & avs °

## 2016-03-20 DIAGNOSIS — C44622 Squamous cell carcinoma of skin of right upper limb, including shoulder: Secondary | ICD-10-CM | POA: Diagnosis not present

## 2016-03-20 DIAGNOSIS — D485 Neoplasm of uncertain behavior of skin: Secondary | ICD-10-CM | POA: Diagnosis not present

## 2016-04-04 DIAGNOSIS — M25569 Pain in unspecified knee: Secondary | ICD-10-CM | POA: Diagnosis not present

## 2016-04-23 ENCOUNTER — Telehealth: Payer: Self-pay | Admitting: *Deleted

## 2016-04-23 NOTE — Telephone Encounter (Signed)
Pt asks if Dr. Alvy Bimler needs to order any scans for her?  Pt says she thought her Endocrinologist was going to order scans but instead just ordering a urine test for cortisol in November.

## 2016-04-24 ENCOUNTER — Other Ambulatory Visit: Payer: Self-pay | Admitting: Hematology and Oncology

## 2016-04-24 DIAGNOSIS — C44612 Basal cell carcinoma of skin of right upper limb, including shoulder: Secondary | ICD-10-CM | POA: Diagnosis not present

## 2016-04-24 DIAGNOSIS — C8336 Diffuse large B-cell lymphoma, intrapelvic lymph nodes: Secondary | ICD-10-CM

## 2016-04-24 DIAGNOSIS — C44622 Squamous cell carcinoma of skin of right upper limb, including shoulder: Secondary | ICD-10-CM | POA: Diagnosis not present

## 2016-04-24 NOTE — Telephone Encounter (Signed)
LVM for pt informing her of Dr. Calton Dach message.

## 2016-04-24 NOTE — Telephone Encounter (Signed)
I ordered CT abdomen to be done the day before Tell her she should be contacted 2 weeks prior to CT Then she has to come in to collect her contrast. I would recommend she call us once she knows when so we can move her lab appt to be done before CT

## 2016-06-17 DIAGNOSIS — M150 Primary generalized (osteo)arthritis: Secondary | ICD-10-CM | POA: Diagnosis not present

## 2016-06-17 DIAGNOSIS — C8590 Non-Hodgkin lymphoma, unspecified, unspecified site: Secondary | ICD-10-CM | POA: Diagnosis not present

## 2016-08-13 ENCOUNTER — Ambulatory Visit (HOSPITAL_COMMUNITY)
Admission: RE | Admit: 2016-08-13 | Discharge: 2016-08-13 | Disposition: A | Discharge status: Home / Self Care | Payer: BLUE CROSS/BLUE SHIELD | Source: Ambulatory Visit | Attending: Hematology and Oncology | Admitting: Hematology and Oncology

## 2016-08-13 ENCOUNTER — Encounter (HOSPITAL_COMMUNITY): Payer: Self-pay

## 2016-08-13 DIAGNOSIS — C8336 Diffuse large B-cell lymphoma, intrapelvic lymph nodes: Secondary | ICD-10-CM | POA: Insufficient documentation

## 2016-08-13 DIAGNOSIS — D1803 Hemangioma of intra-abdominal structures: Secondary | ICD-10-CM | POA: Diagnosis not present

## 2016-08-13 MED ORDER — IOPAMIDOL (ISOVUE-300) INJECTION 61%
100.0000 mL | Freq: Once | INTRAVENOUS | Status: AC | PRN
  Administered 2016-08-13: 100 mL via INTRAVENOUS

## 2016-08-14 DIAGNOSIS — Z8572 Personal history of non-Hodgkin lymphomas: Secondary | ICD-10-CM | POA: Diagnosis not present

## 2016-08-14 DIAGNOSIS — Z6826 Body mass index (BMI) 26.0-26.9, adult: Secondary | ICD-10-CM | POA: Diagnosis not present

## 2016-08-14 DIAGNOSIS — Z130 Encounter for screening for diseases of the blood and blood-forming organs and certain disorders involving the immune mechanism: Secondary | ICD-10-CM | POA: Diagnosis not present

## 2016-08-14 DIAGNOSIS — Z1389 Encounter for screening for other disorder: Secondary | ICD-10-CM | POA: Diagnosis not present

## 2016-08-14 DIAGNOSIS — Z01419 Encounter for gynecological examination (general) (routine) without abnormal findings: Secondary | ICD-10-CM | POA: Diagnosis not present

## 2016-08-14 DIAGNOSIS — Z1231 Encounter for screening mammogram for malignant neoplasm of breast: Secondary | ICD-10-CM | POA: Diagnosis not present

## 2016-08-16 ENCOUNTER — Encounter: Payer: Self-pay | Admitting: Hematology and Oncology

## 2016-08-16 ENCOUNTER — Other Ambulatory Visit (HOSPITAL_BASED_OUTPATIENT_CLINIC_OR_DEPARTMENT_OTHER): Payer: BLUE CROSS/BLUE SHIELD

## 2016-08-16 ENCOUNTER — Ambulatory Visit (HOSPITAL_BASED_OUTPATIENT_CLINIC_OR_DEPARTMENT_OTHER): Payer: BLUE CROSS/BLUE SHIELD | Admitting: Hematology and Oncology

## 2016-08-16 DIAGNOSIS — Z720 Tobacco use: Secondary | ICD-10-CM | POA: Diagnosis not present

## 2016-08-16 DIAGNOSIS — D1803 Hemangioma of intra-abdominal structures: Secondary | ICD-10-CM | POA: Insufficient documentation

## 2016-08-16 DIAGNOSIS — R12 Heartburn: Secondary | ICD-10-CM

## 2016-08-16 DIAGNOSIS — D3500 Benign neoplasm of unspecified adrenal gland: Secondary | ICD-10-CM | POA: Diagnosis not present

## 2016-08-16 DIAGNOSIS — Z8572 Personal history of non-Hodgkin lymphomas: Secondary | ICD-10-CM

## 2016-08-16 DIAGNOSIS — D1809 Hemangioma of other sites: Secondary | ICD-10-CM | POA: Diagnosis not present

## 2016-08-16 DIAGNOSIS — Z299 Encounter for prophylactic measures, unspecified: Secondary | ICD-10-CM | POA: Insufficient documentation

## 2016-08-16 NOTE — Assessment & Plan Note (Signed)
She has symptoms of heartburn. I recommend proton pump inhibitor but the patient declines. She is in the process of being referred to GI for screening colonoscopy and I recommend upper endoscopy at the same time. I recommend Tums when necessary

## 2016-08-16 NOTE — Assessment & Plan Note (Signed)
We discussed tobacco cessation. We discussed influenza vaccination but the patient declines. We discussed the importance of screening colonoscopy

## 2016-08-16 NOTE — Assessment & Plan Note (Signed)
She has stable liver hemangioma. Observe only

## 2016-08-16 NOTE — Progress Notes (Signed)
[place] OFFICE PROGRESS NOTE  Patient Care Team: Maurice Small, MD as PCP - General (Family Medicine) Stark Klein, MD as Consulting Physician (General Surgery) Izora Gala, MD as Consulting Physician (Otolaryngology)  SUMMARY OF ONCOLOGIC HISTORY: Oncology History   Diffuse large B cell lymphoma   Staging form: Lymphoid Neoplasms, AJCC 6th Edition     Clinical stage from 12/01/2014: Stage I - Signed by Heath Lark, MD on 12/01/2014        History of B-cell lymphoma   09/08/2014 Imaging    Ultrasound pelvis detected abnormal soft tissue mass in the left groin      09/13/2014 Imaging    Ultrasound abdomen showed up to 7 cm mass-like area of mild hypoechogenicity in the left hepatic lobe      09/21/2014 Imaging    MRI of the abdomen showed large cavernous hemangioma in the liver and bilateral adrenal nodules      10/24/2014 Procedure    She underwent ultrasound-guided biopsy of the left groin mass      10/24/2014 Pathology Results    Accession: [id number] confirm diagnosis of diffuse large B-cell lymphoma      11/15/2014 Imaging    Echocardiogram showed normal ejection fraction      11/17/2014 Procedure    She has port placement.      11/23/2014 Bone Marrow Biopsy    Accession: [id number] is negative for lymphoma      11/30/2014 - 01/11/2015 Chemotherapy    She received chemotherapy with RCHOP X 3 cycles      11/30/2014 Adverse Reaction    Treatment was complicated by mild hypersensitivity reaction to rituximab      01/30/2015 Imaging    Repeat PET CT scan showed complete response to the lymphadenopathy.      02/23/2015 - 03/22/2015 Radiation Therapy    She received radiation therapy 36 Gy in 20 fractions to left groin area      08/01/2015 Imaging    PET scan showed complete response to Rx      08/13/2016 Imaging    Ct scan showed no adenopathy identified within the abdomen or pelvis.       INTERVAL HISTORY: Please see below for problem oriented  charting. She returns for follow-up. She feels well. Denies new lymphadenopathy. She complained of occasional heartburn. No change in bowel habits. She continues to smoke and is not ready to quit  REVIEW OF SYSTEMS:   Constitutional: Denies fevers, chills or abnormal weight loss Eyes: Denies blurriness of vision Ears, nose, mouth, throat, and face: Denies mucositis or sore throat Respiratory: Denies cough, dyspnea or wheezes Cardiovascular: Denies palpitation, chest discomfort or lower extremity swelling Skin: Denies abnormal skin rashes Lymphatics: Denies new lymphadenopathy or easy bruising Neurological:Denies numbness, tingling or new weaknesses Behavioral/Psych: Mood is stable, no new changes  All other systems were reviewed with the patient and are negative.  I have reviewed the past medical history, past surgical history, social history and family history with the patient and they are unchanged from previous note.  ALLERGIES:  is allergic to adhesive [tape]; influenza vaccines; prednisone; and sulfa antibiotics.  MEDICATIONS:  Current Outpatient Prescriptions  Medication Sig Dispense Refill  . folic acid (FOLVITE) 1 MG tablet Take 1 mg by mouth daily.    . Glucosamine-Chondroit-Vit C-Mn (GLUCOSAMINE CHONDR 1500 COMPLX PO) Take 1 tablet by mouth daily.     Marland Kitchen HYDROcodone-acetaminophen (NORCO) 7.5-325 MG tablet Take 7.5 tablets by mouth every 3 (three) hours as needed for moderate  pain. 20 tablet 0  . ibuprofen (ADVIL) 200 MG tablet Take 200 mg by mouth every 6 (six) hours as needed.    . Multiple Vitamins-Minerals (MULTIVITAMIN PO) Take by mouth daily.    . Omega-3 Fatty Acids (FISH OIL PO) Take by mouth.    . Probiotic Product (PROBIOTIC DAILY PO) Take by mouth.    . Turmeric, Curcuma Longa, (CURCUMIN) POWD by Does not apply route daily.    Marland Kitchen UNABLE TO FIND Take 1 tablet by mouth at bedtime. Med Name: Supplement "DGL"     No current facility-administered medications for this  visit.     PHYSICAL EXAMINATION: ECOG PERFORMANCE STATUS: 0 - Asymptomatic  Vitals:   08/16/16 0939  BP: 122/87  Pulse: 66  Resp: 18  Temp: 97.8 F (36.6 C)   Filed Weights   08/16/16 0939  Weight: 153 lb (69.4 kg)    GENERAL:alert, no distress and comfortable SKIN: skin color, texture, turgor are normal, no rashes or significant lesions EYES: normal, Conjunctiva are pink and non-injected, sclera clear OROPHARYNX:no exudate, no erythema and lips, buccal mucosa, and tongue normal  NECK: supple, thyroid normal size, non-tender, without nodularity LYMPH:  no palpable lymphadenopathy in the cervical, axillary or inguinal LUNGS: clear to auscultation and percussion with normal breathing effort HEART: regular rate & rhythm and no murmurs and no lower extremity edema ABDOMEN:abdomen soft, non-tender and normal bowel sounds Musculoskeletal:no cyanosis of digits and no clubbing  NEURO: alert & oriented x 3 with fluent speech, no focal motor/sensory deficits  LABORATORY DATA:  I have reviewed the data as listed    Component Value Date/Time   NA 141 02/15/2016 1013   K 4.4 02/15/2016 1013   CL 103 11/16/2014 0949   CO2 26 02/15/2016 1013   GLUCOSE 97 02/15/2016 1013   BUN 11.9 02/15/2016 1013   CREATININE 0.8 02/15/2016 1013   CALCIUM 9.7 02/15/2016 1013   PROT 7.2 02/15/2016 1013   ALBUMIN 3.9 02/15/2016 1013   AST 23 02/15/2016 1013   ALT 31 02/15/2016 1013   ALKPHOS 53 02/15/2016 1013   BILITOT 0.51 02/15/2016 1013   GFRNONAA 76 (L) 11/16/2014 0949   GFRAA 88 (L) 11/16/2014 0949    No results found for: SPEP, UPEP  Lab Results  Component Value Date   WBC 4.6 02/15/2016   NEUTROABS 2.9 02/15/2016   HGB 14.7 02/15/2016   HCT 44.4 02/15/2016   MCV 96.8 02/15/2016   PLT 270 02/15/2016      Chemistry      Component Value Date/Time   NA 141 02/15/2016 1013   K 4.4 02/15/2016 1013   CL 103 11/16/2014 0949   CO2 26 02/15/2016 1013   BUN 11.9 02/15/2016 1013    CREATININE 0.8 02/15/2016 1013      Component Value Date/Time   CALCIUM 9.7 02/15/2016 1013   ALKPHOS 53 02/15/2016 1013   AST 23 02/15/2016 1013   ALT 31 02/15/2016 1013   BILITOT 0.51 02/15/2016 1013       RADIOGRAPHIC STUDIES:I reviewed recent CT imaging with the patient I have personally reviewed the radiological images as listed and agreed with the findings in the report.    ASSESSMENT & PLAN:  History of B-cell lymphoma Imaging studies showed complete response to treatment. I reviewed the guidelines with her. Per recommendation, I will order blood work, history and exam every 3-6 months for 5 years and would defer imaging studies to be done only if clinically indicated.  Tobacco abuse I  spent some time counseling the patient the importance of tobacco cessation. She is currently not interested to quit now.   Adrenal adenoma She is undergoing extensive workup by endocrinologist. I will defer to her for further management CT imaging shows stable disease  Hemangioma of liver She has stable liver hemangioma. Observe only  Heartburn She has symptoms of heartburn. I recommend proton pump inhibitor but the patient declines. She is in the process of being referred to GI for screening colonoscopy and I recommend upper endoscopy at the same time. I recommend Tums when necessary  Preventive measure We discussed tobacco cessation. We discussed influenza vaccination but the patient declines. We discussed the importance of screening colonoscopy   No orders of the defined types were placed in this encounter.  All questions were answered. The patient knows to call the clinic with any problems, questions or concerns. No barriers to learning was detected. I spent 20 minutes counseling the patient face to face. The total time spent in the appointment was 25 minutes and more than 50% was on counseling and review of test results     Heath Lark, MD 08/16/2016 10:15 AM

## 2016-08-16 NOTE — Assessment & Plan Note (Signed)
Imaging studies showed complete response to treatment. I reviewed the guidelines with her. Per recommendation, I will order blood work, history and exam every 3-6 months for 5 years and would defer imaging studies to be done only if clinically indicated.

## 2016-08-16 NOTE — Assessment & Plan Note (Signed)
She is undergoing extensive workup by endocrinologist. I will defer to her for further management CT imaging shows stable disease

## 2016-08-16 NOTE — Assessment & Plan Note (Signed)
I spent some time counseling the patient the importance of tobacco cessation. She is currently not interested to quit now. 

## 2016-08-27 DIAGNOSIS — R490 Dysphonia: Secondary | ICD-10-CM | POA: Diagnosis not present

## 2016-08-27 DIAGNOSIS — F17200 Nicotine dependence, unspecified, uncomplicated: Secondary | ICD-10-CM | POA: Diagnosis not present

## 2016-08-29 DIAGNOSIS — Z1211 Encounter for screening for malignant neoplasm of colon: Secondary | ICD-10-CM | POA: Diagnosis not present

## 2016-09-04 DIAGNOSIS — D235 Other benign neoplasm of skin of trunk: Secondary | ICD-10-CM | POA: Diagnosis not present

## 2016-09-04 DIAGNOSIS — L814 Other melanin hyperpigmentation: Secondary | ICD-10-CM | POA: Diagnosis not present

## 2016-09-04 DIAGNOSIS — L821 Other seborrheic keratosis: Secondary | ICD-10-CM | POA: Diagnosis not present

## 2016-09-04 DIAGNOSIS — D1801 Hemangioma of skin and subcutaneous tissue: Secondary | ICD-10-CM | POA: Diagnosis not present

## 2016-09-04 DIAGNOSIS — Z85828 Personal history of other malignant neoplasm of skin: Secondary | ICD-10-CM | POA: Diagnosis not present

## 2016-09-04 DIAGNOSIS — D485 Neoplasm of uncertain behavior of skin: Secondary | ICD-10-CM | POA: Diagnosis not present

## 2016-09-06 DIAGNOSIS — E049 Nontoxic goiter, unspecified: Secondary | ICD-10-CM | POA: Diagnosis not present

## 2016-09-06 DIAGNOSIS — R7301 Impaired fasting glucose: Secondary | ICD-10-CM | POA: Diagnosis not present

## 2016-09-10 ENCOUNTER — Telehealth: Payer: Self-pay | Admitting: General Practice

## 2016-09-10 NOTE — Telephone Encounter (Signed)
Left msg regarding April 2018 appt.

## 2016-09-11 DIAGNOSIS — M25561 Pain in right knee: Secondary | ICD-10-CM | POA: Diagnosis not present

## 2016-09-11 DIAGNOSIS — C8590 Non-Hodgkin lymphoma, unspecified, unspecified site: Secondary | ICD-10-CM | POA: Diagnosis not present

## 2016-09-11 DIAGNOSIS — M150 Primary generalized (osteo)arthritis: Secondary | ICD-10-CM | POA: Diagnosis not present

## 2016-10-14 DIAGNOSIS — E049 Nontoxic goiter, unspecified: Secondary | ICD-10-CM | POA: Diagnosis not present

## 2016-10-14 DIAGNOSIS — D3500 Benign neoplasm of unspecified adrenal gland: Secondary | ICD-10-CM | POA: Diagnosis not present

## 2016-10-14 DIAGNOSIS — R7301 Impaired fasting glucose: Secondary | ICD-10-CM | POA: Diagnosis not present

## 2016-10-16 ENCOUNTER — Other Ambulatory Visit: Payer: Self-pay | Admitting: Endocrinology

## 2016-10-16 DIAGNOSIS — R7301 Impaired fasting glucose: Secondary | ICD-10-CM | POA: Diagnosis not present

## 2016-10-16 DIAGNOSIS — E049 Nontoxic goiter, unspecified: Secondary | ICD-10-CM

## 2016-10-16 DIAGNOSIS — D3500 Benign neoplasm of unspecified adrenal gland: Secondary | ICD-10-CM | POA: Diagnosis not present

## 2016-10-18 DIAGNOSIS — R1310 Dysphagia, unspecified: Secondary | ICD-10-CM | POA: Diagnosis not present

## 2016-10-18 DIAGNOSIS — Z1211 Encounter for screening for malignant neoplasm of colon: Secondary | ICD-10-CM | POA: Diagnosis not present

## 2016-10-18 DIAGNOSIS — Z800 Family history of malignant neoplasm of digestive organs: Secondary | ICD-10-CM | POA: Diagnosis not present

## 2016-10-18 DIAGNOSIS — K228 Other specified diseases of esophagus: Secondary | ICD-10-CM | POA: Diagnosis not present

## 2016-10-18 DIAGNOSIS — D123 Benign neoplasm of transverse colon: Secondary | ICD-10-CM | POA: Diagnosis not present

## 2016-10-18 DIAGNOSIS — K635 Polyp of colon: Secondary | ICD-10-CM | POA: Diagnosis not present

## 2016-10-18 DIAGNOSIS — K621 Rectal polyp: Secondary | ICD-10-CM | POA: Diagnosis not present

## 2016-10-24 ENCOUNTER — Ambulatory Visit
Admission: RE | Admit: 2016-10-24 | Discharge: 2016-10-24 | Disposition: A | Discharge status: Home / Self Care | Payer: BLUE CROSS/BLUE SHIELD | Source: Ambulatory Visit | Attending: Endocrinology | Admitting: Endocrinology

## 2016-10-24 DIAGNOSIS — E049 Nontoxic goiter, unspecified: Secondary | ICD-10-CM

## 2016-10-24 DIAGNOSIS — E042 Nontoxic multinodular goiter: Secondary | ICD-10-CM | POA: Diagnosis not present

## 2016-10-31 DIAGNOSIS — K6289 Other specified diseases of anus and rectum: Secondary | ICD-10-CM | POA: Diagnosis not present

## 2016-10-31 DIAGNOSIS — Z86010 Personal history of colonic polyps: Secondary | ICD-10-CM | POA: Diagnosis not present

## 2016-10-31 DIAGNOSIS — K219 Gastro-esophageal reflux disease without esophagitis: Secondary | ICD-10-CM | POA: Diagnosis not present

## 2016-12-18 DIAGNOSIS — R509 Fever, unspecified: Secondary | ICD-10-CM | POA: Diagnosis not present

## 2016-12-18 DIAGNOSIS — M150 Primary generalized (osteo)arthritis: Secondary | ICD-10-CM | POA: Diagnosis not present

## 2016-12-18 DIAGNOSIS — C8590 Non-Hodgkin lymphoma, unspecified, unspecified site: Secondary | ICD-10-CM | POA: Diagnosis not present

## 2017-02-14 ENCOUNTER — Ambulatory Visit (HOSPITAL_BASED_OUTPATIENT_CLINIC_OR_DEPARTMENT_OTHER): Payer: BLUE CROSS/BLUE SHIELD | Admitting: Hematology and Oncology

## 2017-02-14 ENCOUNTER — Other Ambulatory Visit (HOSPITAL_BASED_OUTPATIENT_CLINIC_OR_DEPARTMENT_OTHER): Payer: BLUE CROSS/BLUE SHIELD

## 2017-02-14 ENCOUNTER — Telehealth: Payer: Self-pay | Admitting: Hematology and Oncology

## 2017-02-14 DIAGNOSIS — Z8572 Personal history of non-Hodgkin lymphomas: Secondary | ICD-10-CM

## 2017-02-14 DIAGNOSIS — R109 Unspecified abdominal pain: Secondary | ICD-10-CM

## 2017-02-14 DIAGNOSIS — Z720 Tobacco use: Secondary | ICD-10-CM

## 2017-02-14 LAB — COMPREHENSIVE METABOLIC PANEL
ALT: 32 U/L (ref 0–55)
AST: 21 U/L (ref 5–34)
Albumin: 4.2 g/dL (ref 3.5–5.0)
Alkaline Phosphatase: 48 U/L (ref 40–150)
Anion Gap: 11 mEq/L (ref 3–11)
BUN: 12.5 mg/dL (ref 7.0–26.0)
CALCIUM: 10 mg/dL (ref 8.4–10.4)
CHLORIDE: 106 meq/L (ref 98–109)
CO2: 24 mEq/L (ref 22–29)
CREATININE: 0.8 mg/dL (ref 0.6–1.1)
EGFR: 89 mL/min/{1.73_m2} — ABNORMAL LOW (ref >90)
GLUCOSE: 99 mg/dL (ref 70–140)
Potassium: 4.4 mEq/L (ref 3.5–5.1)
SODIUM: 141 meq/L (ref 136–145)
Total Bilirubin: 0.58 mg/dL (ref 0.20–1.20)
Total Protein: 7.5 g/dL (ref 6.4–8.3)

## 2017-02-14 LAB — CBC WITH DIFFERENTIAL/PLATELET
BASO%: 1 % (ref 0.0–2.0)
BASOS ABS: 0.1 10*3/uL (ref 0.0–0.1)
EOS ABS: 0.2 10*3/uL (ref 0.0–0.5)
EOS%: 2.4 % (ref 0.0–7.0)
HCT: 45.6 % (ref 34.8–46.6)
HEMOGLOBIN: 15.2 g/dL (ref 11.6–15.9)
LYMPH%: 20.6 % (ref 14.0–49.7)
MCH: 32.4 pg (ref 25.1–34.0)
MCHC: 33.3 g/dL (ref 31.5–36.0)
MCV: 97.4 fL (ref 79.5–101.0)
MONO#: 0.4 10*3/uL (ref 0.1–0.9)
MONO%: 5.9 % (ref 0.0–14.0)
NEUT#: 4.5 10*3/uL (ref 1.5–6.5)
NEUT%: 70.1 % (ref 38.4–76.8)
Platelets: 278 10*3/uL (ref 145–400)
RBC: 4.69 10*6/uL (ref 3.70–5.45)
RDW: 15.1 % — ABNORMAL HIGH (ref 11.2–14.5)
WBC: 6.5 10*3/uL (ref 3.9–10.3)
lymph#: 1.3 10*3/uL (ref 0.9–3.3)

## 2017-02-14 LAB — LACTATE DEHYDROGENASE: LDH: 173 U/L (ref 125–245)

## 2017-02-14 NOTE — Telephone Encounter (Signed)
Appointments scheduled per 4.20.18 LOS. Patient given AVS report and calendars with future scheduled appointments. °

## 2017-02-16 ENCOUNTER — Encounter: Payer: Self-pay | Admitting: Hematology and Oncology

## 2017-02-16 DIAGNOSIS — R109 Unspecified abdominal pain: Secondary | ICD-10-CM | POA: Insufficient documentation

## 2017-02-16 NOTE — Assessment & Plan Note (Signed)
I spent some time counseling the patient the importance of tobacco cessation. She is currently not interested to quit now. 

## 2017-02-16 NOTE — Assessment & Plan Note (Signed)
She has abdominal wall discomfort, unrelated to disease, likely component of abdominal wall injury from exercise Examination is benign I reassured the patient

## 2017-02-16 NOTE — Assessment & Plan Note (Signed)
Her last Imaging studies showed complete response to treatment. I reviewed the guidelines with her. Per recommendation, I will order blood work, history and exam every 3-6 months for 5 years and would defer imaging studies to be done only if clinically indicated.

## 2017-02-16 NOTE — Progress Notes (Signed)
[place] OFFICE PROGRESS NOTE  Patient Care Team: Maurice Small, MD as PCP - General (Family Medicine) Stark Klein, MD as Consulting Physician (General Surgery) Izora Gala, MD as Consulting Physician (Otolaryngology)  SUMMARY OF ONCOLOGIC HISTORY: Oncology History   Diffuse large B cell lymphoma   Staging form: Lymphoid Neoplasms, AJCC 6th Edition     Clinical stage from 12/01/2014: Stage I - Signed by Heath Lark, MD on 12/01/2014        History of B-cell lymphoma   09/08/2014 Imaging    Ultrasound pelvis detected abnormal soft tissue mass in the left groin      09/13/2014 Imaging    Ultrasound abdomen showed up to 7 cm mass-like area of mild hypoechogenicity in the left hepatic lobe      09/21/2014 Imaging    MRI of the abdomen showed large cavernous hemangioma in the liver and bilateral adrenal nodules      10/24/2014 Procedure    She underwent ultrasound-guided biopsy of the left groin mass      10/24/2014 Pathology Results    Accession: [id number] confirm diagnosis of diffuse large B-cell lymphoma      11/15/2014 Imaging    Echocardiogram showed normal ejection fraction      11/17/2014 Procedure    She has port placement.      11/23/2014 Bone Marrow Biopsy    Accession: [id number] is negative for lymphoma      11/30/2014 - 01/11/2015 Chemotherapy    She received chemotherapy with RCHOP X 3 cycles      11/30/2014 Adverse Reaction    Treatment was complicated by mild hypersensitivity reaction to rituximab      01/30/2015 Imaging    Repeat PET CT scan showed complete response to the lymphadenopathy.      02/23/2015 - 03/22/2015 Radiation Therapy    She received radiation therapy 36 Gy in 20 fractions to left groin area      08/01/2015 Imaging    PET scan showed complete response to Rx      08/13/2016 Imaging    Ct scan showed no adenopathy identified within the abdomen or pelvis.       INTERVAL HISTORY: Please see below for problem oriented  charting. She feels well. She has been doing a lot of exercise and yoga and felt some abdominal wall discomfort, localized around the epigastric region, tender to touch The patient continues to smoke due to stress.  Her husband is diagnosed with cancer relapse She denies recent infection No new lymphadenopathy Denies anorexia or weight loss  REVIEW OF SYSTEMS:   Constitutional: Denies fevers, chills or abnormal weight loss Eyes: Denies blurriness of vision Ears, nose, mouth, throat, and face: Denies mucositis or sore throat Respiratory: Denies cough, dyspnea or wheezes Cardiovascular: Denies palpitation, chest discomfort or lower extremity swelling Gastrointestinal:  Denies nausea, heartburn or change in bowel habits Skin: Denies abnormal skin rashes Lymphatics: Denies new lymphadenopathy or easy bruising Neurological:Denies numbness, tingling or new weaknesses Behavioral/Psych: Mood is stable, no new changes  All other systems were reviewed with the patient and are negative.  I have reviewed the past medical history, past surgical history, social history and family history with the patient and they are unchanged from previous note.  ALLERGIES:  is allergic to adhesive [tape]; influenza vaccines; prednisone; and sulfa antibiotics.  MEDICATIONS:  Current Outpatient Prescriptions  Medication Sig Dispense Refill  . folic acid (FOLVITE) 1 MG tablet Take 1 mg by mouth daily.    . Glucosamine-Chondroit-Vit  C-Mn (GLUCOSAMINE CHONDR 1500 COMPLX PO) Take 1 tablet by mouth daily.     Marland Kitchen HYDROcodone-acetaminophen (NORCO) 7.5-325 MG tablet Take 7.5 tablets by mouth every 3 (three) hours as needed for moderate pain. 20 tablet 0  . ibuprofen (ADVIL) 200 MG tablet Take 200 mg by mouth every 6 (six) hours as needed.    . Multiple Vitamins-Minerals (MULTIVITAMIN PO) Take by mouth daily.    . Omega-3 Fatty Acids (FISH OIL PO) Take by mouth.    . Probiotic Product (PROBIOTIC DAILY PO) Take by mouth.     . Turmeric, Curcuma Longa, (CURCUMIN) POWD by Does not apply route daily.    Marland Kitchen UNABLE TO FIND Take 1 tablet by mouth at bedtime. Med Name: Supplement "DGL"     No current facility-administered medications for this visit.     PHYSICAL EXAMINATION: ECOG PERFORMANCE STATUS: 1 - Symptomatic but completely ambulatory  Vitals:   02/14/17 0954  BP: 122/77  Pulse: 69  Resp: 18  Temp: 98.9 F (37.2 C)   Filed Weights   02/14/17 0954  Weight: 153 lb 1.6 oz (69.4 kg)    GENERAL:alert, no distress and comfortable SKIN: skin color, texture, turgor are normal, no rashes or significant lesions EYES: normal, Conjunctiva are pink and non-injected, sclera clear OROPHARYNX:no exudate, no erythema and lips, buccal mucosa, and tongue normal  NECK: supple, thyroid normal size, non-tender, without nodularity LYMPH:  no palpable lymphadenopathy in the cervical, axillary or inguinal LUNGS: clear to auscultation and percussion with normal breathing effort HEART: regular rate & rhythm and no murmurs and no lower extremity edema ABDOMEN:abdomen soft, mild tenderness on the abdominal wall area, no rebound or guarding and normal bowel sounds Musculoskeletal:no cyanosis of digits and no clubbing  NEURO: alert & oriented x 3 with fluent speech, no focal motor/sensory deficits  LABORATORY DATA:  I have reviewed the data as listed    Component Value Date/Time   NA 141 02/14/2017 0932   K 4.4 02/14/2017 0932   CL 103 11/16/2014 0949   CO2 24 02/14/2017 0932   GLUCOSE 99 02/14/2017 0932   BUN 12.5 02/14/2017 0932   CREATININE 0.8 02/14/2017 0932   CALCIUM 10.0 02/14/2017 0932   PROT 7.5 02/14/2017 0932   ALBUMIN 4.2 02/14/2017 0932   AST 21 02/14/2017 0932   ALT 32 02/14/2017 0932   ALKPHOS 48 02/14/2017 0932   BILITOT 0.58 02/14/2017 0932   GFRNONAA 76 (L) 11/16/2014 0949   GFRAA 88 (L) 11/16/2014 0949    No results found for: SPEP, UPEP  Lab Results  Component Value Date   WBC 6.5  02/14/2017   NEUTROABS 4.5 02/14/2017   HGB 15.2 02/14/2017   HCT 45.6 02/14/2017   MCV 97.4 02/14/2017   PLT 278 02/14/2017      Chemistry      Component Value Date/Time   NA 141 02/14/2017 0932   K 4.4 02/14/2017 0932   CL 103 11/16/2014 0949   CO2 24 02/14/2017 0932   BUN 12.5 02/14/2017 0932   CREATININE 0.8 02/14/2017 0932      Component Value Date/Time   CALCIUM 10.0 02/14/2017 0932   ALKPHOS 48 02/14/2017 0932   AST 21 02/14/2017 0932   ALT 32 02/14/2017 0932   BILITOT 0.58 02/14/2017 0932       ASSESSMENT & PLAN:  History of B-cell lymphoma Her last Imaging studies showed complete response to treatment. I reviewed the guidelines with her. Per recommendation, I will order blood work, history and  exam every 3-6 months for 5 years and would defer imaging studies to be done only if clinically indicated.  Abdominal discomfort She has abdominal wall discomfort, unrelated to disease, likely component of abdominal wall injury from exercise Examination is benign I reassured the patient  Tobacco abuse I spent some time counseling the patient the importance of tobacco cessation. She is currently not interested to quit now.    No orders of the defined types were placed in this encounter.  All questions were answered. The patient knows to call the clinic with any problems, questions or concerns. No barriers to learning was detected. I spent 15 minutes counseling the patient face to face. The total time spent in the appointment was 20 minutes and more than 50% was on counseling and review of test results     Heath Lark, MD 02/16/2017 7:05 AM

## 2017-03-17 DIAGNOSIS — C8590 Non-Hodgkin lymphoma, unspecified, unspecified site: Secondary | ICD-10-CM | POA: Diagnosis not present

## 2017-03-17 DIAGNOSIS — M150 Primary generalized (osteo)arthritis: Secondary | ICD-10-CM | POA: Diagnosis not present

## 2017-04-29 DIAGNOSIS — D1801 Hemangioma of skin and subcutaneous tissue: Secondary | ICD-10-CM | POA: Diagnosis not present

## 2017-04-29 DIAGNOSIS — L559 Sunburn, unspecified: Secondary | ICD-10-CM | POA: Diagnosis not present

## 2017-04-29 DIAGNOSIS — L814 Other melanin hyperpigmentation: Secondary | ICD-10-CM | POA: Diagnosis not present

## 2017-04-29 DIAGNOSIS — L821 Other seborrheic keratosis: Secondary | ICD-10-CM | POA: Diagnosis not present

## 2017-06-12 DIAGNOSIS — R490 Dysphonia: Secondary | ICD-10-CM | POA: Diagnosis not present

## 2017-06-12 DIAGNOSIS — F17210 Nicotine dependence, cigarettes, uncomplicated: Secondary | ICD-10-CM | POA: Diagnosis not present

## 2017-06-12 DIAGNOSIS — M159 Polyosteoarthritis, unspecified: Secondary | ICD-10-CM | POA: Diagnosis not present

## 2017-06-18 DIAGNOSIS — C8590 Non-Hodgkin lymphoma, unspecified, unspecified site: Secondary | ICD-10-CM | POA: Diagnosis not present

## 2017-06-18 DIAGNOSIS — M150 Primary generalized (osteo)arthritis: Secondary | ICD-10-CM | POA: Diagnosis not present

## 2017-08-08 ENCOUNTER — Other Ambulatory Visit: Payer: Self-pay | Admitting: Hematology and Oncology

## 2017-08-08 DIAGNOSIS — Z8572 Personal history of non-Hodgkin lymphomas: Secondary | ICD-10-CM

## 2017-08-14 ENCOUNTER — Other Ambulatory Visit (HOSPITAL_BASED_OUTPATIENT_CLINIC_OR_DEPARTMENT_OTHER): Payer: BLUE CROSS/BLUE SHIELD

## 2017-08-14 ENCOUNTER — Encounter: Payer: Self-pay | Admitting: Hematology and Oncology

## 2017-08-14 ENCOUNTER — Telehealth: Payer: Self-pay

## 2017-08-14 ENCOUNTER — Ambulatory Visit (HOSPITAL_BASED_OUTPATIENT_CLINIC_OR_DEPARTMENT_OTHER): Payer: BLUE CROSS/BLUE SHIELD | Admitting: Hematology and Oncology

## 2017-08-14 VITALS — BP 141/84 | HR 57 | Temp 98.4°F | Resp 18 | Ht 63.0 in | Wt 155.4 lb

## 2017-08-14 DIAGNOSIS — R61 Generalized hyperhidrosis: Secondary | ICD-10-CM

## 2017-08-14 DIAGNOSIS — D1803 Hemangioma of intra-abdominal structures: Secondary | ICD-10-CM

## 2017-08-14 DIAGNOSIS — D3500 Benign neoplasm of unspecified adrenal gland: Secondary | ICD-10-CM

## 2017-08-14 DIAGNOSIS — C8336 Diffuse large B-cell lymphoma, intrapelvic lymph nodes: Secondary | ICD-10-CM

## 2017-08-14 DIAGNOSIS — Z720 Tobacco use: Secondary | ICD-10-CM

## 2017-08-14 DIAGNOSIS — Z8572 Personal history of non-Hodgkin lymphomas: Secondary | ICD-10-CM | POA: Diagnosis not present

## 2017-08-14 DIAGNOSIS — F419 Anxiety disorder, unspecified: Secondary | ICD-10-CM

## 2017-08-14 LAB — COMPREHENSIVE METABOLIC PANEL
ALT: 29 U/L (ref 0–55)
AST: 24 U/L (ref 5–34)
Albumin: 4 g/dL (ref 3.5–5.0)
Alkaline Phosphatase: 49 U/L (ref 40–150)
Anion Gap: 9 mEq/L (ref 3–11)
BUN: 13 mg/dL (ref 7.0–26.0)
CHLORIDE: 107 meq/L (ref 98–109)
CO2: 26 meq/L (ref 22–29)
CREATININE: 0.8 mg/dL (ref 0.6–1.1)
Calcium: 10.1 mg/dL (ref 8.4–10.4)
EGFR: >60 mL/min/{1.73_m2} (ref >60)
GLUCOSE: 100 mg/dL (ref 70–140)
POTASSIUM: 4.3 meq/L (ref 3.5–5.1)
SODIUM: 141 meq/L (ref 136–145)
Total Bilirubin: 0.38 mg/dL (ref 0.20–1.20)
Total Protein: 7.6 g/dL (ref 6.4–8.3)

## 2017-08-14 LAB — CBC WITH DIFFERENTIAL/PLATELET
BASO%: 1.3 % (ref 0.0–2.0)
BASOS ABS: 0.1 10*3/uL (ref 0.0–0.1)
EOS%: 3.6 % (ref 0.0–7.0)
Eosinophils Absolute: 0.2 10*3/uL (ref 0.0–0.5)
HEMATOCRIT: 44.4 % (ref 34.8–46.6)
HGB: 14.9 g/dL (ref 11.6–15.9)
LYMPH#: 1.6 10*3/uL (ref 0.9–3.3)
LYMPH%: 29.9 % (ref 14.0–49.7)
MCH: 32.9 pg (ref 25.1–34.0)
MCHC: 33.6 g/dL (ref 31.5–36.0)
MCV: 98 fL (ref 79.5–101.0)
MONO#: 0.3 10*3/uL (ref 0.1–0.9)
MONO%: 5.5 % (ref 0.0–14.0)
NEUT#: 3.2 10*3/uL (ref 1.5–6.5)
NEUT%: 59.7 % (ref 38.4–76.8)
Platelets: 267 10*3/uL (ref 145–400)
RBC: 4.53 10*6/uL (ref 3.70–5.45)
RDW: 14.1 % (ref 11.2–14.5)
WBC: 5.3 10*3/uL (ref 3.9–10.3)

## 2017-08-14 LAB — LACTATE DEHYDROGENASE: LDH: 201 U/L (ref 125–245)

## 2017-08-14 NOTE — Assessment & Plan Note (Signed)
She has significant concern about cancer recurrence She have intermittent night sweats The patient continues to smoke I recommend CT scan of the chest, abdomen and pelvis for further evaluation

## 2017-08-14 NOTE — Assessment & Plan Note (Signed)
I spent some time counseling the patient the importance of tobacco cessation. She is currently not interested to quit now. 

## 2017-08-14 NOTE — Progress Notes (Signed)
[facility] OFFICE PROGRESS NOTE  Patient Care Team: Maurice Small, MD as PCP - General (Family Medicine) Stark Klein, MD as Consulting Physician (General Surgery) Izora Gala, MD as Consulting Physician (Otolaryngology)  SUMMARY OF ONCOLOGIC HISTORY: Oncology History   Diffuse large B cell lymphoma   Staging form: Lymphoid Neoplasms, AJCC 6th Edition     Clinical stage from 12/01/2014: Stage I - Signed by Heath Lark, MD on 12/01/2014        History of B-cell lymphoma   09/08/2014 Imaging    Ultrasound pelvis detected abnormal soft tissue mass in the left groin      09/13/2014 Imaging    Ultrasound abdomen showed up to 7 cm mass-like area of mild hypoechogenicity in the left hepatic lobe      09/21/2014 Imaging    MRI of the abdomen showed 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      11/23/2014 Bone Marrow Biopsy    Accession: [id number] is negative for lymphoma      11/30/2014 - 01/11/2015 Chemotherapy    She received chemotherapy with RCHOP X 3 cycles      11/30/2014 Adverse Reaction    Treatment was complicated by mild hypersensitivity reaction to rituximab      01/30/2015 Imaging    Repeat PET CT scan showed complete response to the lymphadenopathy.      02/23/2015 - 03/22/2015 Radiation Therapy    She received radiation therapy 36 Gy in 20 fractions to left groin area      08/01/2015 Imaging    PET scan showed complete response to Rx      08/13/2016 Imaging    Ct scan showed no adenopathy identified within the abdomen or pelvis.       INTERVAL HISTORY: Please see below for problem  oriented charting. She returns for further follow-up. She complain of intermittent neck sweats She denies new lymphadenopathy No recent infection She is concerned about cancer recurrence She have anxiety since the death of her husband She continued to smoke and unable to quit smoking She declined influenza vaccination  REVIEW OF SYSTEMS:   Constitutional: Denies fevers, chills or abnormal weight loss Eyes: Denies blurriness of vision Ears, nose, mouth, throat, and face: Denies mucositis or sore throat Respiratory: Denies cough, dyspnea or wheezes Cardiovascular: Denies palpitation, chest discomfort or lower extremity swelling Gastrointestinal:  Denies nausea, heartburn or change in bowel habits Skin: Denies abnormal skin rashes Lymphatics: Denies new lymphadenopathy or easy bruising Neurological:Denies numbness, tingling or new weaknesses Behavioral/Psych: Mood is stable, no new changes  All other systems were reviewed with the patient and are negative.  I have reviewed the past medical history, past surgical history, social history and family history with the patient and they are unchanged from previous note.  ALLERGIES:  is allergic to adhesive [tape]; influenza vaccines; prednisone; and sulfa antibiotics.  MEDICATIONS:  Current Outpatient Prescriptions  Medication Sig Dispense Refill  . folic acid (FOLVITE) 1 MG tablet Take 1 mg by mouth daily.    . Glucosamine-Chondroit-Vit C-Mn (GLUCOSAMINE CHONDR 1500 COMPLX PO) Take 1 tablet  by mouth daily.     Marland Kitchen HYDROcodone-acetaminophen (NORCO) 7.5-325 MG tablet Take 7.5 tablets by mouth every 3 (three) hours as needed for moderate pain. 20 tablet 0  . ibuprofen (ADVIL) 200 MG tablet Take 200 mg by mouth every 6 (six) hours as needed.    . Multiple Vitamins-Minerals (MULTIVITAMIN PO) Take by mouth daily.    . Omega-3 Fatty Acids (FISH OIL PO) Take by mouth.    . Probiotic Product (PROBIOTIC DAILY PO) Take by mouth.    . Turmeric, Curcuma  Longa, (CURCUMIN) POWD by Does not apply route daily.    Marland Kitchen UNABLE TO FIND Take 1 tablet by mouth at bedtime. Med Name: Supplement "DGL"     No current facility-administered medications for this visit.     PHYSICAL EXAMINATION: ECOG PERFORMANCE STATUS: 1 - Symptomatic but completely ambulatory  Vitals:   08/14/17 0950  BP: (!) 141/84  Pulse: (!) 57  Resp: 18  Temp: 98.4 F (36.9 C)  SpO2: 98%   Filed Weights   08/14/17 0950  Weight: 155 lb 6.4 oz (70.5 kg)    GENERAL:alert, no distress and comfortable SKIN: skin color, texture, turgor are normal, no rashes or significant lesions EYES: normal, Conjunctiva are pink and non-injected, sclera clear OROPHARYNX:no exudate, no erythema and lips, buccal mucosa, and tongue normal  NECK: supple, thyroid normal size, non-tender, without nodularity LYMPH:  no palpable lymphadenopathy in the cervical, axillary or inguinal LUNGS: clear to auscultation and percussion with normal breathing effort HEART: regular rate & rhythm and no murmurs and no lower extremity edema ABDOMEN:abdomen soft, non-tender and normal bowel sounds Musculoskeletal:no cyanosis of digits and no clubbing  NEURO: alert & oriented x 3 with fluent speech, no focal motor/sensory deficits  LABORATORY DATA:  I have reviewed the data as listed    Component Value Date/Time   NA 141 08/14/2017 0931   K 4.3 08/14/2017 0931   CL 103 11/16/2014 0949   CO2 26 08/14/2017 0931   GLUCOSE 100 08/14/2017 0931   BUN 13.0 08/14/2017 0931   CREATININE 0.8 08/14/2017 0931   CALCIUM 10.1 08/14/2017 0931   PROT 7.6 08/14/2017 0931   ALBUMIN 4.0 08/14/2017 0931   AST 24 08/14/2017 0931   ALT 29 08/14/2017 0931   ALKPHOS 49 08/14/2017 0931   BILITOT 0.38 08/14/2017 0931   GFRNONAA 76 (L) 11/16/2014 0949   GFRAA 88 (L) 11/16/2014 0949    No results found for: SPEP, UPEP  Lab Results  Component Value Date   WBC 5.3 08/14/2017   NEUTROABS 3.2 08/14/2017   HGB 14.9 08/14/2017    HCT 44.4 08/14/2017   MCV 98.0 08/14/2017   PLT 267 08/14/2017      Chemistry      Component Value Date/Time   NA 141 08/14/2017 0931   K 4.3 08/14/2017 0931   CL 103 11/16/2014 0949   CO2 26 08/14/2017 0931   BUN 13.0 08/14/2017 0931   CREATININE 0.8 08/14/2017 0931      Component Value Date/Time   CALCIUM 10.1 08/14/2017 0931   ALKPHOS 49 08/14/2017 0931   AST 24 08/14/2017 0931   ALT 29 08/14/2017 0931   BILITOT 0.38 08/14/2017 0931      ASSESSMENT & PLAN:  History of B-cell lymphoma She has significant concern about cancer recurrence She have intermittent night sweats The patient continues to smoke I recommend CT scan of the chest, abdomen and pelvis for further evaluation  Tobacco abuse I spent some time counseling the  patient the importance of tobacco cessation. She is currently not interested to quit now.   Adrenal adenoma The patient has abnormal imaging study in the past Due to her new concerns, I will order CT scan of the chest, abdomen and pelvis for further evaluation   Orders Placed This Encounter  Procedures  . CT ABDOMEN PELVIS W CONTRAST    Standing Status:   Future    Standing Expiration Date:   08/14/2018    Order Specific Question:   If indicated for the ordered procedure, I authorize the administration of contrast media per Radiology protocol    Answer:   Yes    Order Specific Question:   Preferred imaging location?    Answer:   [facility]    Order Specific Question:   Radiology Contrast Protocol - do NOT remove file path    Answer:   \\[email or web address]    Order Specific Question:   Is patient pregnant?    Answer:   No  . CT CHEST W CONTRAST    Standing Status:   Future    Standing Expiration Date:   08/14/2018    Order Specific Question:   If indicated for the ordered procedure, I authorize the administration of contrast media per Radiology protocol    Answer:   Yes    Order Specific Question:    Preferred imaging location?    Answer:   [facility]    Order Specific Question:   Radiology Contrast Protocol - do NOT remove file path    Answer:   \\[email or web address]    Order Specific Question:   Is patient pregnant?    Answer:   No   All questions were answered. The patient knows to call the clinic with any problems, questions or concerns. No barriers to learning was detected. I spent 15 minutes counseling the patient face to face. The total time spent in the appointment was 20 minutes and more than 50% was on counseling and review of test results     Heath Lark, MD 08/14/2017 1:34 PM

## 2017-08-14 NOTE — Telephone Encounter (Signed)
Printed avs and calender for upcoming appointment. Per 10/18 los

## 2017-08-14 NOTE — Assessment & Plan Note (Signed)
The patient has abnormal imaging study in the past Due to her new concerns, I will order CT scan of the chest, abdomen and pelvis for further evaluation

## 2017-08-20 ENCOUNTER — Ambulatory Visit (HOSPITAL_COMMUNITY)
Admission: RE | Admit: 2017-08-20 | Discharge: 2017-08-20 | Disposition: A | Discharge status: Home / Self Care | Payer: BLUE CROSS/BLUE SHIELD | Source: Ambulatory Visit | Attending: Hematology and Oncology | Admitting: Hematology and Oncology

## 2017-08-20 ENCOUNTER — Encounter (HOSPITAL_COMMUNITY): Payer: Self-pay

## 2017-08-20 DIAGNOSIS — R140 Abdominal distension (gaseous): Secondary | ICD-10-CM | POA: Diagnosis not present

## 2017-08-20 DIAGNOSIS — K760 Fatty (change of) liver, not elsewhere classified: Secondary | ICD-10-CM | POA: Diagnosis not present

## 2017-08-20 DIAGNOSIS — D3501 Benign neoplasm of right adrenal gland: Secondary | ICD-10-CM | POA: Insufficient documentation

## 2017-08-20 DIAGNOSIS — E041 Nontoxic single thyroid nodule: Secondary | ICD-10-CM | POA: Insufficient documentation

## 2017-08-20 DIAGNOSIS — M5136 Other intervertebral disc degeneration, lumbar region: Secondary | ICD-10-CM | POA: Insufficient documentation

## 2017-08-20 DIAGNOSIS — D1803 Hemangioma of intra-abdominal structures: Secondary | ICD-10-CM | POA: Diagnosis not present

## 2017-08-20 DIAGNOSIS — R918 Other nonspecific abnormal finding of lung field: Secondary | ICD-10-CM | POA: Diagnosis not present

## 2017-08-20 DIAGNOSIS — C8336 Diffuse large B-cell lymphoma, intrapelvic lymph nodes: Secondary | ICD-10-CM | POA: Diagnosis not present

## 2017-08-20 DIAGNOSIS — I700 Atherosclerosis of aorta: Secondary | ICD-10-CM | POA: Insufficient documentation

## 2017-08-20 MED ORDER — IOPAMIDOL (ISOVUE-300) INJECTION 61%
INTRAVENOUS | Status: AC
  Administered 2017-08-20: 100 mL via INTRAVENOUS
  Filled 2017-08-20: qty 100

## 2017-08-20 MED ORDER — IOPAMIDOL (ISOVUE-300) INJECTION 61%
100.0000 mL | Freq: Once | INTRAVENOUS | Status: AC | PRN
  Administered 2017-08-20: 100 mL via INTRAVENOUS

## 2017-08-25 ENCOUNTER — Telehealth: Payer: Self-pay

## 2017-08-25 NOTE — Telephone Encounter (Signed)
Called with below message. 

## 2017-08-25 NOTE — Telephone Encounter (Signed)
-----   Message from Heath Lark, MD sent at 08/25/2017  6:26 AM EDT ----- Regarding: CT scan PLs let her know CT showed no evidence of lymphoma Liver cysts are stable ----- Message ----- From: Interface, Rad Results In Sent: 08/20/2017   9:24 AM To: Heath Lark, MD

## 2017-08-26 ENCOUNTER — Telehealth: Payer: Self-pay | Admitting: *Deleted

## 2017-08-26 NOTE — Telephone Encounter (Signed)
Notified of message below

## 2017-08-26 NOTE — Telephone Encounter (Signed)
-----   Message from Heath Lark, MD sent at 08/25/2017  6:26 AM EDT ----- Regarding: CT scan PLs let her know CT showed no evidence of lymphoma Liver cysts are stable ----- Message ----- From: Interface, Rad Results In Sent: 08/20/2017   9:24 AM To: Heath Lark, MD

## 2017-08-26 NOTE — Telephone Encounter (Signed)
LM to call Dr Alvy Bimler nurse at her convenience

## 2017-09-16 DIAGNOSIS — R05 Cough: Secondary | ICD-10-CM | POA: Diagnosis not present

## 2017-10-01 DIAGNOSIS — C8590 Non-Hodgkin lymphoma, unspecified, unspecified site: Secondary | ICD-10-CM | POA: Diagnosis not present

## 2017-10-01 DIAGNOSIS — M150 Primary generalized (osteo)arthritis: Secondary | ICD-10-CM | POA: Diagnosis not present

## 2017-10-09 IMAGING — US US THYROID
1 series · 13 of 25 positions shown · non-contrast
Comparison: 09/10/2013

CLINICAL DATA: Goiter.  History of lymphoma.

EXAM:
THYROID ULTRASOUND
TECHNIQUE: Ultrasound examination of the thyroid gland and adjacent soft
tissues was performed.

[Series 1: us thyroid · 0.06mm/px · 13 of 68 slices shown]
[im 1/68]
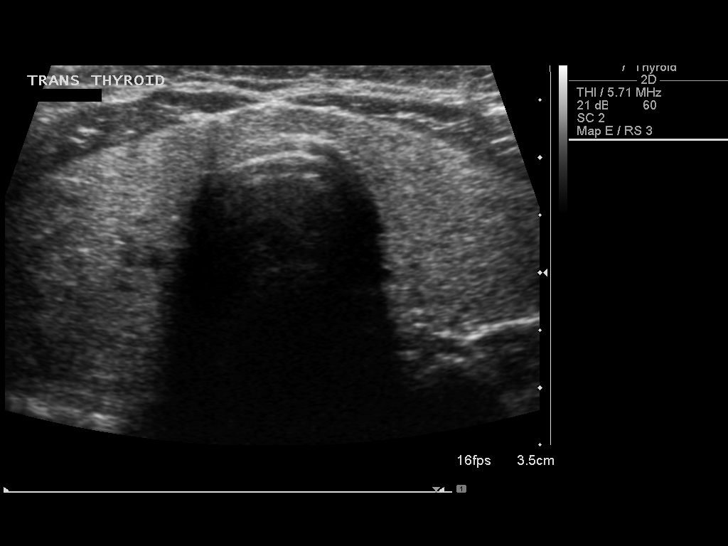
[im 6/68]
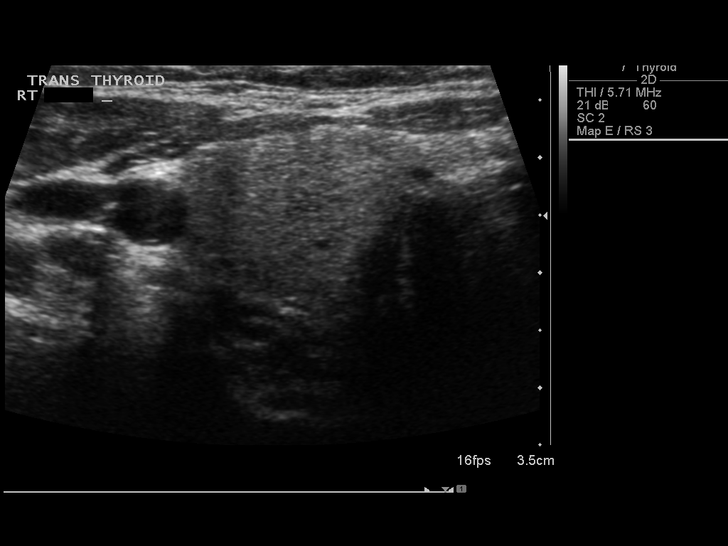
[im 12/68]
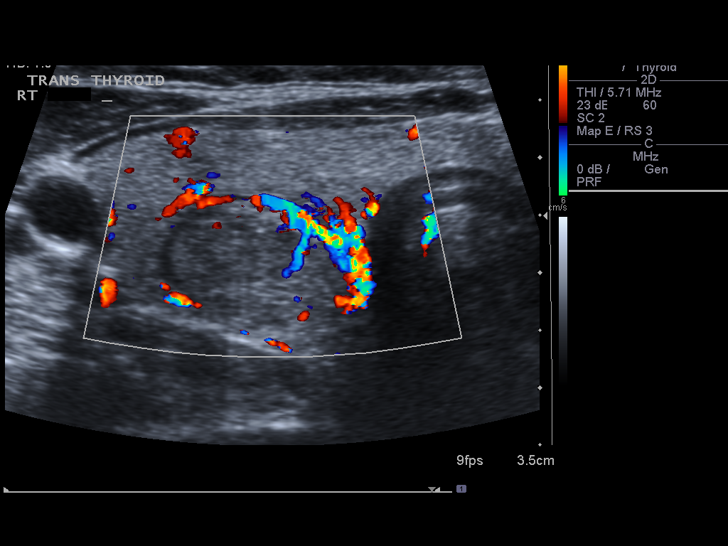
[im 17/68]
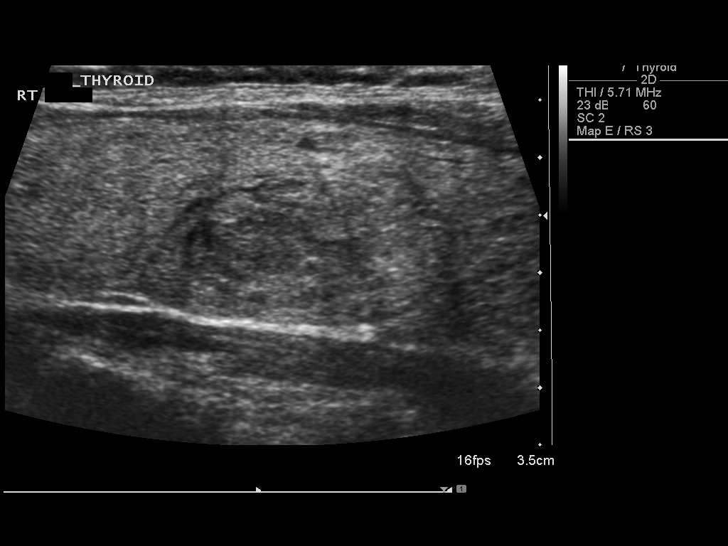
[im 23/68]
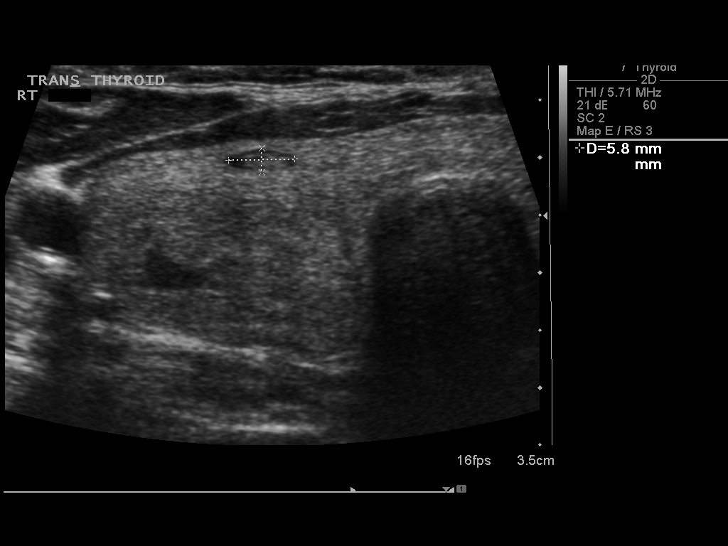
[im 28/68]
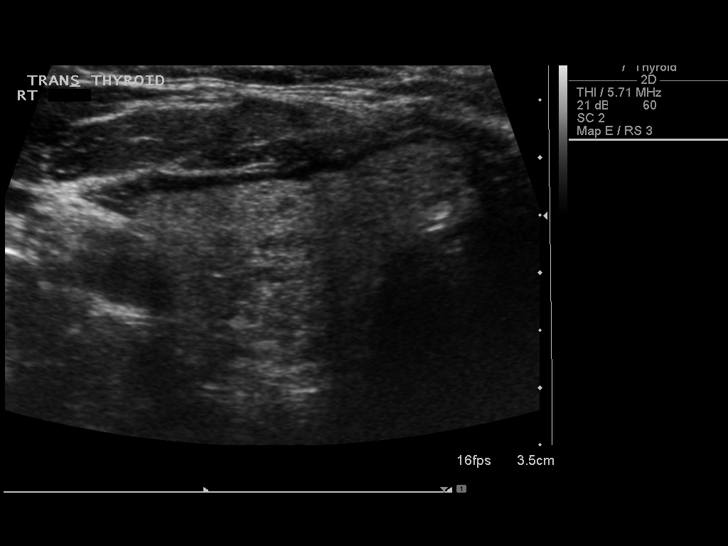
[im 34/68]
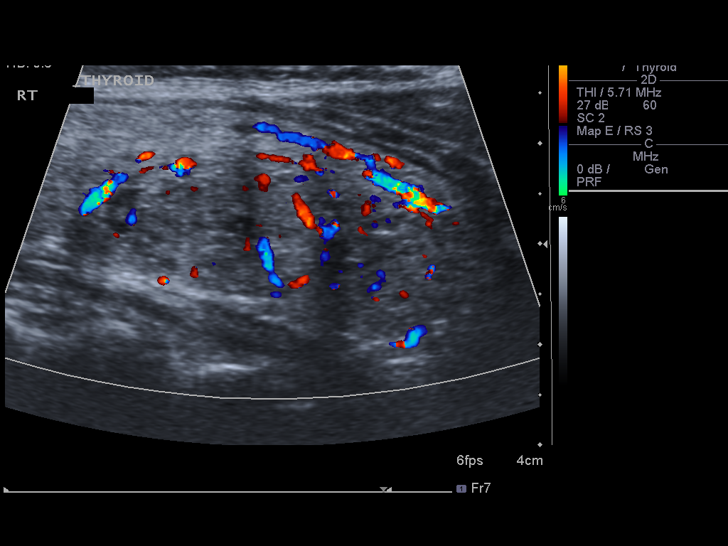
[im 40/68]
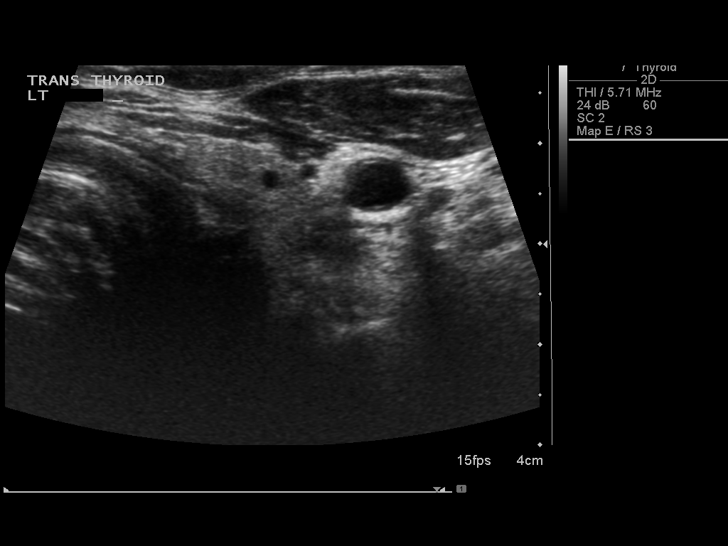
[im 45/68]
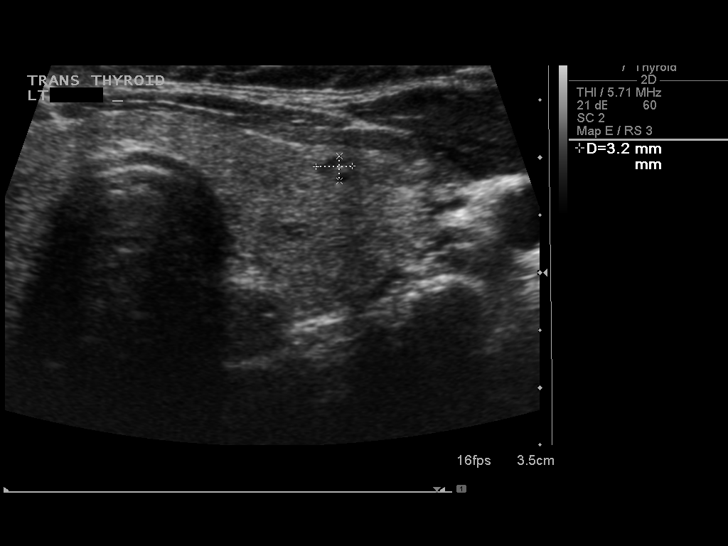
[im 51/68]
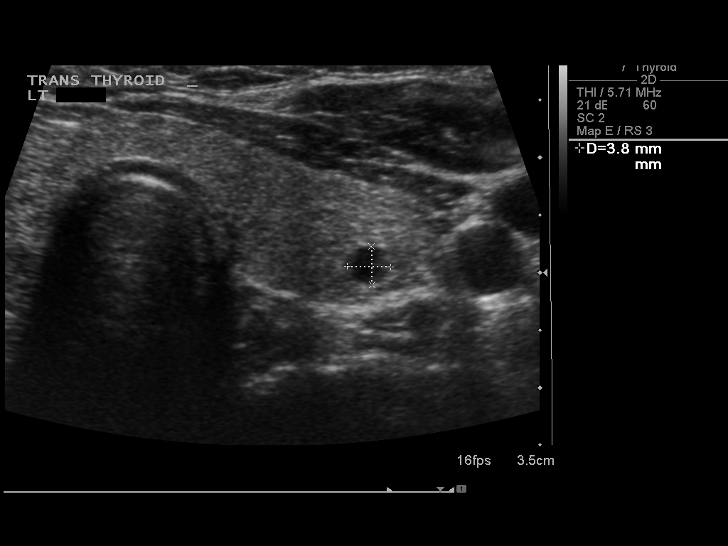
[im 56/68]
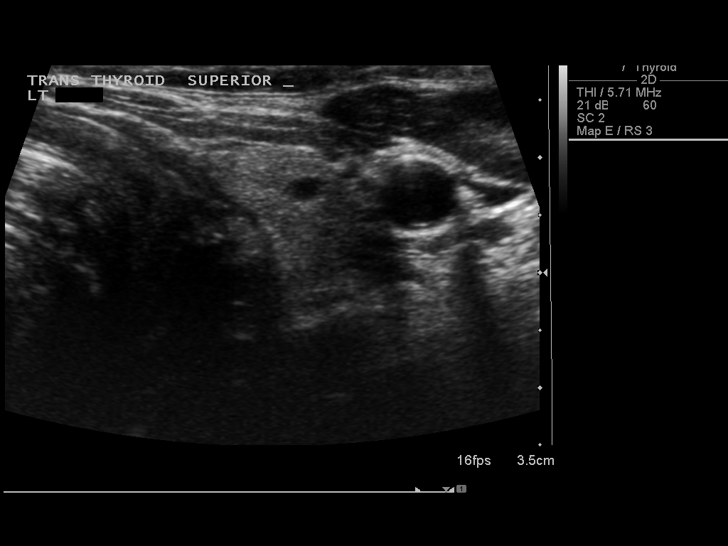
[im 62/68]
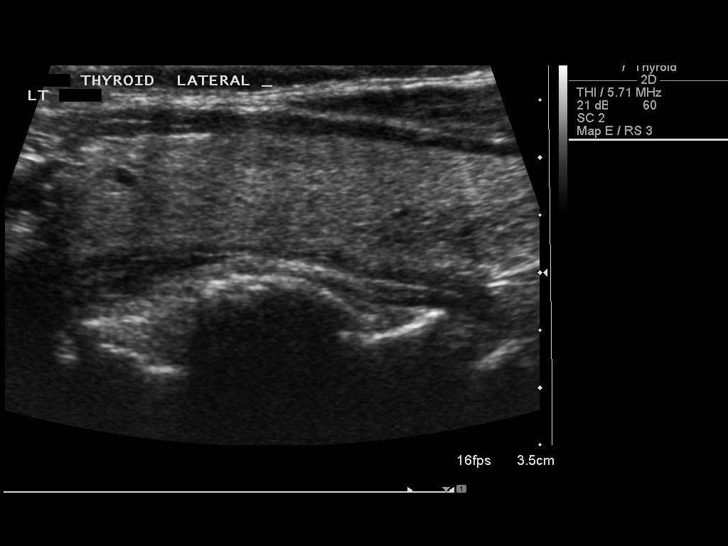
[im 68/68]
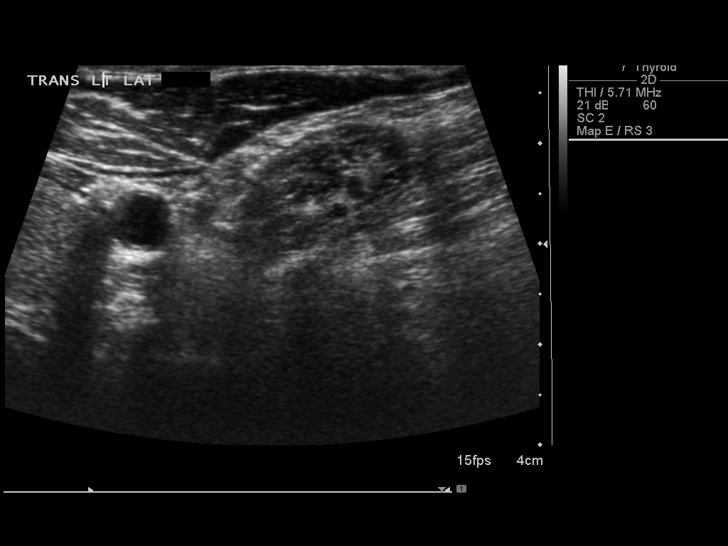

[13 of 25 positions shown; findings below may reference images not displayed]

FINDINGS: Parenchymal Echotexture: Mildly heterogenous

Estimated total number of nodules >/= 1 cm: 1

Number of spongiform nodules >/=  2 cm not described below (TR1): 0

Number of mixed cystic and solid nodules >/= 1.5 cm not described
below (TR2): 0

_________________________________________________________

Isthmus: 0.4 cm thickness, stable

No discrete nodules are identified within the thyroid isthmus.

_________________________________________________________

Right lobe: 5.9 x 2.1 x 2.7 cm (previously 5.5 x 1.8 x 2.5)

Nodule # 1:

Location: Right; Mid

Size: 2.4 x 1.2 x 1.7 cm (previously 2.5 x 1.3 x 2)

Composition: solid/almost completely solid (2)

Echogenicity: hypoechoic (2)

Shape: not taller-than-wide (0)

Margins: smooth (0)

Echogenic foci: punctate echogenic foci (3)

ACR TI-RADS total points: 7.

ACR TI-RADS risk category: TR5 (>/= 7 points).

ACR TI-RADS recommendations:

**Given size (>/= 1.0 cm) and appearance, fine needle aspiration of
this highly suspicious nodule should be considered based on TI-RADS
criteria.

There are 2 complex mid lobe cysts each measuring 6 mm maximum
diameter.

_________________________________________________________

Left lobe: 5.3 x 1.8 x 2 cm (previously 5.4 x 1.7 x 2)

At least 3 small hypoechoic/cystic lesions, all less than 5 mm
IMPRESSION: 1. Stable thyromegaly with bilateral nodules.
2. Recommend FNA biopsy of highly suspicious 2.4 cm right mid
nodule.

The above is in keeping with the ACR TI-RADS recommendations - [HOSPITAL] 2626;[DATE].

## 2017-11-13 ENCOUNTER — Telehealth: Payer: Self-pay

## 2017-11-13 NOTE — Telephone Encounter (Signed)
Called with below message. Scheduling message sent.

## 2017-11-13 NOTE — Telephone Encounter (Signed)
I can see her at 9 am, tomorrow, 30 mins Please send scheduling msg

## 2017-11-13 NOTE — Telephone Encounter (Signed)
Patient called and left message to call her.  Called back. Found a lump today that is about 1/2 inch wide under her breast at center of breast bone. Very anxious and would like your first availble appt.

## 2017-11-14 ENCOUNTER — Inpatient Hospital Stay: Payer: BLUE CROSS/BLUE SHIELD | Attending: Hematology and Oncology | Admitting: Hematology and Oncology

## 2017-11-14 ENCOUNTER — Encounter: Payer: Self-pay | Admitting: Hematology and Oncology

## 2017-11-14 DIAGNOSIS — K760 Fatty (change of) liver, not elsewhere classified: Secondary | ICD-10-CM

## 2017-11-14 DIAGNOSIS — D3501 Benign neoplasm of right adrenal gland: Secondary | ICD-10-CM | POA: Diagnosis not present

## 2017-11-14 DIAGNOSIS — M5136 Other intervertebral disc degeneration, lumbar region: Secondary | ICD-10-CM | POA: Diagnosis not present

## 2017-11-14 DIAGNOSIS — E041 Nontoxic single thyroid nodule: Secondary | ICD-10-CM | POA: Diagnosis not present

## 2017-11-14 DIAGNOSIS — D3502 Benign neoplasm of left adrenal gland: Secondary | ICD-10-CM | POA: Diagnosis not present

## 2017-11-14 DIAGNOSIS — R229 Localized swelling, mass and lump, unspecified: Secondary | ICD-10-CM | POA: Insufficient documentation

## 2017-11-14 DIAGNOSIS — I700 Atherosclerosis of aorta: Secondary | ICD-10-CM | POA: Insufficient documentation

## 2017-11-14 DIAGNOSIS — D1800 Hemangioma unspecified site: Secondary | ICD-10-CM | POA: Diagnosis not present

## 2017-11-14 DIAGNOSIS — Z8572 Personal history of non-Hodgkin lymphomas: Secondary | ICD-10-CM | POA: Diagnosis not present

## 2017-11-14 DIAGNOSIS — M419 Scoliosis, unspecified: Secondary | ICD-10-CM | POA: Insufficient documentation

## 2017-11-14 DIAGNOSIS — Z923 Personal history of irradiation: Secondary | ICD-10-CM | POA: Diagnosis not present

## 2017-11-14 DIAGNOSIS — R222 Localized swelling, mass and lump, trunk: Secondary | ICD-10-CM | POA: Insufficient documentation

## 2017-11-14 NOTE — Assessment & Plan Note (Signed)
She has significant concern about cancer recurrence Examination of the palpable nodule is not due to cancer recurrence.  The patient is reassured

## 2017-11-14 NOTE — Progress Notes (Signed)
[facility] OFFICE PROGRESS NOTE  Patient Care Team: Maurice Small, MD as PCP - General (Family Medicine) Stark Klein, MD as Consulting Physician (General Surgery) Izora Gala, MD as Consulting Physician (Otolaryngology)  SUMMARY OF ONCOLOGIC HISTORY: Oncology History   Diffuse large B cell lymphoma   Staging form: Lymphoid Neoplasms, AJCC 6th Edition     Clinical stage from 12/01/2014: Stage I - Signed by Heath Lark, MD on 12/01/2014        History of B-cell lymphoma   09/08/2014 Imaging    Ultrasound pelvis detected abnormal soft tissue mass in the left groin      09/13/2014 Imaging    Ultrasound abdomen showed up to 7 cm mass-like area of mild hypoechogenicity in the left hepatic lobe      09/21/2014 Imaging    MRI of the abdomen showed 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      11/23/2014 Bone Marrow Biopsy    Accession: [id number] is negative for lymphoma      11/30/2014 - 01/11/2015 Chemotherapy    She received chemotherapy with RCHOP X 3 cycles      11/30/2014 Adverse Reaction    Treatment was complicated by mild hypersensitivity reaction to rituximab      01/30/2015 Imaging    Repeat PET CT scan showed complete response to the lymphadenopathy.      02/23/2015 - 03/22/2015 Radiation Therapy    She received radiation therapy 36 Gy in 20 fractions to left groin area      08/01/2015 Imaging    PET scan showed complete response to Rx      08/13/2016 Imaging    Ct scan showed no adenopathy identified within the abdomen or pelvis.      08/20/2017 Imaging    1. No findings of active  lymphoma. 2. Hepatic hemangiomas and bilateral adrenal adenomas. 3. Right posterior thyroid lobe nodule was not previously hypermetabolic and accordingly is probably benign. 4. Calcified granuloma in the left upper lobe. No worrisome pulmonary nodule. 5.  Aortic Atherosclerosis (ICD10-I70.0). 6. Mild lumbar scoliosis and degenerative disc disease. 7. Diffuse hepatic steatosis.       INTERVAL HISTORY: Please see below for problem oriented charting. She returns for further follow-up and urgent request due to palpable skin nodule near her bra line It has been present for 1 day She denies pain No recent fever or chills She denies any recent abnormal breast examination, palpable mass, abnormal breast appearance or nipple changes  REVIEW OF SYSTEMS:   Constitutional: Denies fevers, chills or abnormal weight loss Eyes: Denies blurriness of vision Ears, nose, mouth, throat, and face: Denies mucositis or sore throat Respiratory: Denies cough, dyspnea or wheezes Cardiovascular: Denies palpitation, chest discomfort or lower extremity swelling Gastrointestinal:  Denies nausea, heartburn or change in bowel habits Lymphatics: Denies new lymphadenopathy or easy bruising Neurological:Denies numbness, tingling or new weaknesses Behavioral/Psych: Mood is stable, no new changes  All other systems were reviewed with the patient and are negative.  I have reviewed the past medical history, past surgical history, social history and family history  with the patient and they are unchanged from previous note.  ALLERGIES:  is allergic to adhesive [tape]; influenza vaccines; prednisone; and sulfa antibiotics.  MEDICATIONS:  Current Outpatient Medications  Medication Sig Dispense Refill  . folic acid (FOLVITE) 1 MG tablet Take 1 mg by mouth daily.    . Glucosamine-Chondroit-Vit C-Mn (GLUCOSAMINE CHONDR 1500 COMPLX PO) Take 1 tablet by mouth daily.     Marland Kitchen ibuprofen (ADVIL) 200 MG tablet Take 200 mg by mouth  every 6 (six) hours as needed.    . Multiple Vitamins-Minerals (MULTIVITAMIN PO) Take by mouth daily.    . Omega-3 Fatty Acids (FISH OIL PO) Take by mouth.    . Probiotic Product (PROBIOTIC DAILY PO) Take by mouth.    . Turmeric, Curcuma Longa, (CURCUMIN) POWD by Does not apply route daily.    Marland Kitchen UNABLE TO FIND Take 1 tablet by mouth at bedtime. Med Name: Supplement "DGL"     No current facility-administered medications for this visit.     PHYSICAL EXAMINATION: ECOG PERFORMANCE STATUS: 0 - Asymptomatic  Vitals:   11/14/17 0955  BP: (!) 144/93  Pulse: 71  Resp: 18  Temp: 98.2 F (36.8 C)  SpO2: 98%   Filed Weights   11/14/17 0955  Weight: 161 lb 6.4 oz (73.2 kg)    GENERAL:alert, no distress and comfortable SKIN: She has a palpable subcutaneous nodule between her breasts at the bra line compatible with a cyst EYES: normal, Conjunctiva are pink and non-injected, sclera clear OROPHARYNX:no exudate, no erythema and lips, buccal mucosa, and tongue normal  NECK: supple, thyroid normal size, non-tender, without nodularity LYMPH:  no palpable lymphadenopathy in the cervical, axillary or inguinal LUNGS: clear to auscultation and percussion with normal breathing effort HEART: regular rate & rhythm and no murmurs and no lower extremity edema ABDOMEN:abdomen soft, non-tender and normal bowel sounds Musculoskeletal:no cyanosis of digits and no clubbing  NEURO: alert & oriented x 3 with fluent speech, no focal motor/sensory deficits  LABORATORY DATA:  I have reviewed the data as listed    Component Value Date/Time   NA 141 08/14/2017 0931   K 4.3 08/14/2017 0931   CL 103 11/16/2014 0949   CO2 26 08/14/2017 0931   GLUCOSE 100 08/14/2017 0931   BUN 13.0 08/14/2017 0931   CREATININE 0.8 08/14/2017 0931   CALCIUM 10.1 08/14/2017 0931   PROT 7.6 08/14/2017 0931   ALBUMIN 4.0 08/14/2017 0931   AST 24 08/14/2017 0931   ALT 29 08/14/2017 0931   ALKPHOS 49 08/14/2017 0931   BILITOT  0.38 08/14/2017 0931   GFRNONAA 76 (L) 11/16/2014 0949   GFRAA 88 (L) 11/16/2014 0949    No results found for: SPEP, UPEP  Lab Results  Component Value Date   WBC 5.3 08/14/2017   NEUTROABS 3.2 08/14/2017   HGB 14.9 08/14/2017   HCT 44.4 08/14/2017   MCV 98.0 08/14/2017   PLT 267 08/14/2017      Chemistry      Component Value Date/Time   NA 141 08/14/2017 0931   K 4.3 08/14/2017 0931   CL 103 11/16/2014 0949   CO2 26 08/14/2017 0931   BUN 13.0 08/14/2017 0931   CREATININE 0.8 08/14/2017 0931      Component Value Date/Time   CALCIUM 10.1 08/14/2017 0931   ALKPHOS 49 08/14/2017 0931   AST 24 08/14/2017 0931   ALT 29 08/14/2017 0931   BILITOT 0.38 08/14/2017 0931       ASSESSMENT & PLAN:  History  of B-cell lymphoma She has significant concern about cancer recurrence Examination of the palpable nodule is not due to cancer recurrence.  The patient is reassured  Skin nodule The appearance of the skin nodule is compatible with a cyst I reassured the patient If it bothers the patient, I recommend general surgery consult to have it removed   No orders of the defined types were placed in this encounter.  All questions were answered. The patient knows to call the clinic with any problems, questions or concerns. No barriers to learning was detected. I spent 10 minutes counseling the patient face to face. The total time spent in the appointment was 15 minutes and more than 50% was on counseling and review of test results     Heath Lark, MD 11/14/2017 8:01 PM

## 2017-11-14 NOTE — Assessment & Plan Note (Signed)
The appearance of the skin nodule is compatible with a cyst I reassured the patient If it bothers the patient, I recommend general surgery consult to have it removed

## 2017-11-17 ENCOUNTER — Telehealth: Payer: Self-pay | Admitting: Hematology and Oncology

## 2017-11-17 NOTE — Telephone Encounter (Signed)
Return for no new orders per 1/18 los.  °

## 2017-12-02 DIAGNOSIS — R7301 Impaired fasting glucose: Secondary | ICD-10-CM | POA: Diagnosis not present

## 2017-12-02 DIAGNOSIS — D3500 Benign neoplasm of unspecified adrenal gland: Secondary | ICD-10-CM | POA: Diagnosis not present

## 2017-12-08 ENCOUNTER — Other Ambulatory Visit: Payer: Self-pay | Admitting: Endocrinology

## 2017-12-08 DIAGNOSIS — R7301 Impaired fasting glucose: Secondary | ICD-10-CM | POA: Diagnosis not present

## 2017-12-08 DIAGNOSIS — D3500 Benign neoplasm of unspecified adrenal gland: Secondary | ICD-10-CM | POA: Diagnosis not present

## 2017-12-08 DIAGNOSIS — E049 Nontoxic goiter, unspecified: Secondary | ICD-10-CM

## 2017-12-18 DIAGNOSIS — D3500 Benign neoplasm of unspecified adrenal gland: Secondary | ICD-10-CM | POA: Diagnosis not present

## 2018-02-03 ENCOUNTER — Telehealth: Payer: Self-pay | Admitting: Hematology and Oncology

## 2018-02-03 NOTE — Telephone Encounter (Signed)
Left message for patient regarding upcoming appointment updates per provider request.

## 2018-02-11 ENCOUNTER — Ambulatory Visit
Admission: RE | Admit: 2018-02-11 | Discharge: 2018-02-11 | Disposition: A | Discharge status: Home / Self Care | Payer: BLUE CROSS/BLUE SHIELD | Source: Ambulatory Visit | Attending: Endocrinology | Admitting: Endocrinology

## 2018-02-11 DIAGNOSIS — E049 Nontoxic goiter, unspecified: Secondary | ICD-10-CM

## 2018-02-11 DIAGNOSIS — E042 Nontoxic multinodular goiter: Secondary | ICD-10-CM | POA: Diagnosis not present

## 2018-02-12 ENCOUNTER — Ambulatory Visit: Payer: BLUE CROSS/BLUE SHIELD | Admitting: Hematology and Oncology

## 2018-02-12 ENCOUNTER — Other Ambulatory Visit: Payer: BLUE CROSS/BLUE SHIELD

## 2018-02-12 ENCOUNTER — Other Ambulatory Visit: Payer: Self-pay | Admitting: Endocrinology

## 2018-02-12 DIAGNOSIS — E041 Nontoxic single thyroid nodule: Secondary | ICD-10-CM

## 2018-02-23 DIAGNOSIS — G8929 Other chronic pain: Secondary | ICD-10-CM | POA: Diagnosis not present

## 2018-02-23 DIAGNOSIS — C8590 Non-Hodgkin lymphoma, unspecified, unspecified site: Secondary | ICD-10-CM | POA: Diagnosis not present

## 2018-02-23 DIAGNOSIS — M545 Low back pain: Secondary | ICD-10-CM | POA: Diagnosis not present

## 2018-02-23 DIAGNOSIS — M150 Primary generalized (osteo)arthritis: Secondary | ICD-10-CM | POA: Diagnosis not present

## 2018-02-25 ENCOUNTER — Ambulatory Visit
Admission: RE | Admit: 2018-02-25 | Discharge: 2018-02-25 | Disposition: A | Discharge status: Home / Self Care | Payer: BLUE CROSS/BLUE SHIELD | Source: Ambulatory Visit | Attending: Endocrinology | Admitting: Endocrinology

## 2018-02-25 ENCOUNTER — Other Ambulatory Visit (HOSPITAL_COMMUNITY)
Admission: RE | Admit: 2018-02-25 | Discharge: 2018-02-25 | Disposition: A | Discharge status: Home / Self Care | Payer: BLUE CROSS/BLUE SHIELD | Source: Ambulatory Visit | Attending: Radiology | Admitting: Radiology

## 2018-02-25 DIAGNOSIS — E041 Nontoxic single thyroid nodule: Secondary | ICD-10-CM | POA: Insufficient documentation

## 2018-03-05 ENCOUNTER — Other Ambulatory Visit: Payer: Self-pay | Admitting: Hematology and Oncology

## 2018-03-05 ENCOUNTER — Telehealth: Payer: Self-pay

## 2018-03-05 DIAGNOSIS — C8336 Diffuse large B-cell lymphoma, intrapelvic lymph nodes: Secondary | ICD-10-CM

## 2018-03-05 DIAGNOSIS — R5383 Other fatigue: Secondary | ICD-10-CM | POA: Insufficient documentation

## 2018-03-05 NOTE — Telephone Encounter (Signed)
She called and left a message she has a lab appt and she will see Dr. Alvy Bimler tomorrow. She is asking if Dr. Alvy Bimler can order a complete set of labs tomorrow?  If not, could she order labs for thyroid function?

## 2018-03-05 NOTE — Telephone Encounter (Signed)
Called with below message. Verbalized understanding. 

## 2018-03-05 NOTE — Telephone Encounter (Signed)
OK I ordered full labs and TSH

## 2018-03-06 ENCOUNTER — Inpatient Hospital Stay (HOSPITAL_BASED_OUTPATIENT_CLINIC_OR_DEPARTMENT_OTHER): Payer: BLUE CROSS/BLUE SHIELD | Admitting: Hematology and Oncology

## 2018-03-06 ENCOUNTER — Inpatient Hospital Stay: Payer: BLUE CROSS/BLUE SHIELD | Attending: Hematology and Oncology

## 2018-03-06 ENCOUNTER — Telehealth: Payer: Self-pay | Admitting: Hematology and Oncology

## 2018-03-06 ENCOUNTER — Telehealth: Payer: Self-pay | Admitting: *Deleted

## 2018-03-06 ENCOUNTER — Encounter: Payer: Self-pay | Admitting: Hematology and Oncology

## 2018-03-06 DIAGNOSIS — D3501 Benign neoplasm of right adrenal gland: Secondary | ICD-10-CM | POA: Insufficient documentation

## 2018-03-06 DIAGNOSIS — M5136 Other intervertebral disc degeneration, lumbar region: Secondary | ICD-10-CM | POA: Insufficient documentation

## 2018-03-06 DIAGNOSIS — Z923 Personal history of irradiation: Secondary | ICD-10-CM | POA: Insufficient documentation

## 2018-03-06 DIAGNOSIS — F17210 Nicotine dependence, cigarettes, uncomplicated: Secondary | ICD-10-CM | POA: Insufficient documentation

## 2018-03-06 DIAGNOSIS — D1800 Hemangioma unspecified site: Secondary | ICD-10-CM

## 2018-03-06 DIAGNOSIS — D3502 Benign neoplasm of left adrenal gland: Secondary | ICD-10-CM | POA: Insufficient documentation

## 2018-03-06 DIAGNOSIS — R499 Unspecified voice and resonance disorder: Secondary | ICD-10-CM | POA: Insufficient documentation

## 2018-03-06 DIAGNOSIS — Z720 Tobacco use: Secondary | ICD-10-CM

## 2018-03-06 DIAGNOSIS — Z8572 Personal history of non-Hodgkin lymphomas: Secondary | ICD-10-CM | POA: Diagnosis not present

## 2018-03-06 DIAGNOSIS — R5383 Other fatigue: Secondary | ICD-10-CM | POA: Diagnosis not present

## 2018-03-06 DIAGNOSIS — I700 Atherosclerosis of aorta: Secondary | ICD-10-CM

## 2018-03-06 DIAGNOSIS — E041 Nontoxic single thyroid nodule: Secondary | ICD-10-CM | POA: Insufficient documentation

## 2018-03-06 DIAGNOSIS — Z79899 Other long term (current) drug therapy: Secondary | ICD-10-CM | POA: Diagnosis not present

## 2018-03-06 DIAGNOSIS — C8336 Diffuse large B-cell lymphoma, intrapelvic lymph nodes: Secondary | ICD-10-CM

## 2018-03-06 DIAGNOSIS — K760 Fatty (change of) liver, not elsewhere classified: Secondary | ICD-10-CM | POA: Diagnosis not present

## 2018-03-06 DIAGNOSIS — Z9221 Personal history of antineoplastic chemotherapy: Secondary | ICD-10-CM

## 2018-03-06 LAB — CBC WITH DIFFERENTIAL/PLATELET
BASOS ABS: 0.1 10*3/uL (ref 0.0–0.1)
Basophils Relative: 1 %
EOS PCT: 3 %
Eosinophils Absolute: 0.1 10*3/uL (ref 0.0–0.5)
HCT: 44.2 % (ref 34.8–46.6)
Hemoglobin: 14.6 g/dL (ref 11.6–15.9)
LYMPHS PCT: 29 %
Lymphs Abs: 1.6 10*3/uL (ref 0.9–3.3)
MCH: 32.5 pg (ref 25.1–34.0)
MCHC: 33.1 g/dL (ref 31.5–36.0)
MCV: 98 fL (ref 79.5–101.0)
MONO ABS: 0.4 10*3/uL (ref 0.1–0.9)
Monocytes Relative: 7 %
Neutro Abs: 3.3 10*3/uL (ref 1.5–6.5)
Neutrophils Relative %: 60 %
PLATELETS: 277 10*3/uL (ref 145–400)
RBC: 4.51 MIL/uL (ref 3.70–5.45)
RDW: 13.7 % (ref 11.2–14.5)
WBC: 5.4 10*3/uL (ref 3.9–10.3)

## 2018-03-06 LAB — COMPREHENSIVE METABOLIC PANEL
ALT: 40 U/L (ref 0–55)
ANION GAP: 8 (ref 3–11)
AST: 22 U/L (ref 5–34)
Albumin: 4 g/dL (ref 3.5–5.0)
Alkaline Phosphatase: 50 U/L (ref 40–150)
BUN: 16 mg/dL (ref 7–26)
CHLORIDE: 108 mmol/L (ref 98–109)
CO2: 25 mmol/L (ref 22–29)
Calcium: 9.5 mg/dL (ref 8.4–10.4)
Creatinine, Ser: 0.83 mg/dL (ref 0.60–1.10)
GFR calc Af Amer: >60 mL/min (ref >60)
Glucose, Bld: 108 mg/dL (ref 70–140)
POTASSIUM: 4.3 mmol/L (ref 3.5–5.1)
Sodium: 141 mmol/L (ref 136–145)
Total Bilirubin: 0.4 mg/dL (ref 0.2–1.2)
Total Protein: 7.3 g/dL (ref 6.4–8.3)

## 2018-03-06 LAB — TSH: TSH: 1.03 u[IU]/mL (ref 0.308–3.960)

## 2018-03-06 LAB — LACTATE DEHYDROGENASE: LDH: 179 U/L (ref 125–245)

## 2018-03-06 NOTE — Telephone Encounter (Signed)
Left message with note below. To call if has any questions 

## 2018-03-06 NOTE — Telephone Encounter (Signed)
Gave avs and calendar ° °

## 2018-03-06 NOTE — Assessment & Plan Note (Signed)
She has excessive fatigue TSH is pending.  I will call her with test result I suspect her fatigue is due to poor sleep We discussed home remedies

## 2018-03-06 NOTE — Assessment & Plan Note (Signed)
She complained the recent voice change again She had negative ENT evaluation in August 2018 She underwent ultrasound of her neck and was noted to have thyroid nodule. FNA was negative for malignancy The patient continues to have significant concerns about cancer within the head and neck region I recommend her to call her ENT doctor for follow-up appointment and she agreed

## 2018-03-06 NOTE — Assessment & Plan Note (Signed)
I spent some time counseling the patient the importance of tobacco cessation. She is currently interested to quit on her own

## 2018-03-06 NOTE — Progress Notes (Signed)
[facility] OFFICE PROGRESS NOTE  Patient Care Team: Maurice Small, MD as PCP - General (Family Medicine) Stark Klein, MD as Consulting Physician (General Surgery) Izora Gala, MD as Consulting Physician (Otolaryngology)  ASSESSMENT & PLAN:  History of B-cell lymphoma Her examination is benign I recommend her to quit smoking I will see her once a year with history, physical examination and blood work only  Tobacco abuse I spent some time counseling the patient the importance of tobacco cessation. She is currently interested to quit on her own   Change in voice She complained the recent voice change again She had negative ENT evaluation in August 2018 She underwent ultrasound of her neck and was noted to have thyroid nodule. FNA was negative for malignancy The patient continues to have significant concerns about cancer within the head and neck region I recommend her to call her ENT doctor for follow-up appointment and she agreed  Other fatigue She has excessive fatigue TSH is pending.  I will call her with test result I suspect her fatigue is due to poor sleep We discussed home remedies   No orders of the defined types were placed in this encounter.   INTERVAL HISTORY: Please see below for problem oriented charting. She returns for further follow-up She is concerned about voice changes although I cannot appreciate voice change today She had extensive evaluation by ENT last year that was negative for malignancy She continues to smoke about 6 cigarettes/day She sleeps poorly and complained of excessive fatigue She had extensive evaluation of her thyroid recently with negative fine-needle aspirate and biopsy She denies new lymphadenopathy Denies recent infection  SUMMARY OF ONCOLOGIC HISTORY: Oncology History   Diffuse large B cell lymphoma   Staging form: Lymphoid Neoplasms, AJCC 6th Edition     Clinical stage from 12/01/2014: Stage I - Signed by Heath Lark, MD on 12/01/2014        History of B-cell lymphoma   09/08/2014 Imaging    Ultrasound pelvis detected abnormal soft tissue mass in the left groin      09/13/2014 Imaging    Ultrasound abdomen showed up to 7 cm mass-like area of mild hypoechogenicity in the left hepatic lobe      09/21/2014 Imaging    MRI of the abdomen showed 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      11/23/2014 Bone Marrow Biopsy    Accession: [id number] is negative for lymphoma      11/30/2014 - 01/11/2015 Chemotherapy    She received chemotherapy with RCHOP X 3 cycles      11/30/2014 Adverse Reaction    Treatment was complicated by mild hypersensitivity reaction to rituximab      01/30/2015 Imaging    Repeat PET CT scan showed complete response to the lymphadenopathy.      02/23/2015 - 03/22/2015 Radiation Therapy    She received radiation therapy 36 Gy in 20 fractions to left groin area      08/01/2015 Imaging    PET scan showed complete response to Rx      08/13/2016  Imaging    Ct scan showed no adenopathy identified within the abdomen or pelvis.      08/20/2017 Imaging    1. No findings of active lymphoma. 2. Hepatic hemangiomas and bilateral adrenal adenomas. 3. Right posterior thyroid lobe nodule was not previously hypermetabolic and accordingly is probably benign. 4. Calcified granuloma in the left upper lobe. No worrisome pulmonary nodule. 5.  Aortic Atherosclerosis (ICD10-I70.0). 6. Mild lumbar scoliosis and degenerative disc disease. 7. Diffuse hepatic steatosis.       REVIEW OF SYSTEMS:   Constitutional: Denies  fevers, chills or abnormal weight loss Eyes: Denies blurriness of vision Ears, nose, mouth, throat, and face: Denies mucositis or sore throat Respiratory: Denies cough, dyspnea or wheezes Cardiovascular: Denies palpitation, chest discomfort or lower extremity swelling Gastrointestinal:  Denies nausea, heartburn or change in bowel habits Skin: Denies abnormal skin rashes Lymphatics: Denies new lymphadenopathy or easy bruising Neurological:Denies numbness, tingling or new weaknesses Behavioral/Psych: Mood is stable, no new changes  All other systems were reviewed with the patient and are negative.  I have reviewed the past medical history, past surgical history, social history and family history with the patient and they are unchanged from previous note.  ALLERGIES:  is allergic to adhesive [tape]; influenza vaccines; prednisone; and sulfa antibiotics.  MEDICATIONS:  Current Outpatient Medications  Medication Sig Dispense Refill  . folic acid (FOLVITE) 1 MG tablet Take 1 mg by mouth daily.    . Glucosamine-Chondroit-Vit C-Mn (GLUCOSAMINE CHONDR 1500 COMPLX PO) Take 1 tablet by mouth daily.     Marland Kitchen ibuprofen (ADVIL) 200 MG tablet Take 200 mg by mouth every 6 (six) hours as needed.    . Multiple Vitamins-Minerals (MULTIVITAMIN PO) Take by mouth daily.    . Omega-3 Fatty Acids (FISH OIL PO) Take by mouth.    . Probiotic Product (PROBIOTIC DAILY PO) Take by mouth.    . Turmeric, Curcuma Longa, (CURCUMIN) POWD by Does not apply route daily.    Marland Kitchen UNABLE TO FIND Take 1 tablet by mouth at bedtime. Med Name: Supplement "DGL"     No current facility-administered medications for this visit.     PHYSICAL EXAMINATION: ECOG PERFORMANCE STATUS: 1 - Symptomatic but completely ambulatory  Vitals:   03/06/18 0934  BP: 129/82  Pulse: 66  Resp: 18  Temp: 98.9 F (37.2 C)  SpO2: 98%   Filed Weights   03/06/18 0934  Weight: 160 lb 4.8 oz (72.7 kg)    GENERAL:alert, no distress and  comfortable SKIN: skin color, texture, turgor are normal, no rashes or significant lesions EYES: normal, Conjunctiva are pink and non-injected, sclera clear OROPHARYNX:no exudate, no erythema and lips, buccal mucosa, and tongue normal  NECK: supple, thyroid normal size, non-tender, without nodularity LYMPH:  no palpable lymphadenopathy in the cervical, axillary or inguinal LUNGS: clear to auscultation and percussion with normal breathing effort HEART: regular rate & rhythm and no murmurs and no lower extremity edema ABDOMEN:abdomen soft, non-tender and normal bowel sounds Musculoskeletal:no cyanosis of digits and no clubbing  NEURO: alert & oriented x 3 with fluent speech, no focal motor/sensory deficits  LABORATORY DATA:  I have reviewed the data as listed    Component Value Date/Time   NA 141 03/06/2018 0917   NA 141 08/14/2017 0931   K 4.3 03/06/2018 0917   K 4.3 08/14/2017 0931   CL 108 03/06/2018 0917   CO2 25 03/06/2018 0917   CO2 26 08/14/2017 0931   GLUCOSE 108 03/06/2018 0917   GLUCOSE 100  08/14/2017 0931   BUN 16 03/06/2018 0917   BUN 13.0 08/14/2017 0931   CREATININE 0.83 03/06/2018 0917   CREATININE 0.8 08/14/2017 0931   CALCIUM 9.5 03/06/2018 0917   CALCIUM 10.1 08/14/2017 0931   PROT 7.3 03/06/2018 0917   PROT 7.6 08/14/2017 0931   ALBUMIN 4.0 03/06/2018 0917   ALBUMIN 4.0 08/14/2017 0931   AST 22 03/06/2018 0917   AST 24 08/14/2017 0931   ALT 40 03/06/2018 0917   ALT 29 08/14/2017 0931   ALKPHOS 50 03/06/2018 0917   ALKPHOS 49 08/14/2017 0931   BILITOT 0.4 03/06/2018 0917   BILITOT 0.38 08/14/2017 0931   GFRNONAA >60 03/06/2018 0917   GFRAA >60 03/06/2018 0917    No results found for: SPEP, UPEP  Lab Results  Component Value Date   WBC 5.4 03/06/2018   NEUTROABS 3.3 03/06/2018   HGB 14.6 03/06/2018   HCT 44.2 03/06/2018   MCV 98.0 03/06/2018   PLT 277 03/06/2018      Chemistry      Component Value Date/Time   NA 141 03/06/2018 0917   NA  141 08/14/2017 0931   K 4.3 03/06/2018 0917   K 4.3 08/14/2017 0931   CL 108 03/06/2018 0917   CO2 25 03/06/2018 0917   CO2 26 08/14/2017 0931   BUN 16 03/06/2018 0917   BUN 13.0 08/14/2017 0931   CREATININE 0.83 03/06/2018 0917   CREATININE 0.8 08/14/2017 0931      Component Value Date/Time   CALCIUM 9.5 03/06/2018 0917   CALCIUM 10.1 08/14/2017 0931   ALKPHOS 50 03/06/2018 0917   ALKPHOS 49 08/14/2017 0931   AST 22 03/06/2018 0917   AST 24 08/14/2017 0931   ALT 40 03/06/2018 0917   ALT 29 08/14/2017 0931   BILITOT 0.4 03/06/2018 0917   BILITOT 0.38 08/14/2017 0931       RADIOGRAPHIC STUDIES: I have personally reviewed the radiological images as listed and agreed with the findings in the report. [place] Fna Biopsy Thyroid 1st Lesion  Result Date: 02/25/2018 INDICATION: Indeterminate thyroid nodule Right inferior thyroid nodule; 2.5 cm EXAM: ULTRASOUND GUIDED FINE NEEDLE ASPIRATION OF INDETERMINATE THYROID NODULE COMPARISON:  US Thyroid 02/11/18 MEDICATIONS: 5 cc 1% lidocaine COMPLICATIONS: None immediate. TECHNIQUE: Informed written consent was obtained from the patient after a discussion of the risks, benefits and alternatives to treatment. Questions regarding the procedure were encouraged and answered. A timeout was performed prior to the initiation of the procedure. Pre-procedural ultrasound scanning demonstrated unchanged size and appearance of the indeterminate nodule within the right thyroid The procedure was planned. The neck was prepped in the usual sterile fashion, and a sterile drape was applied covering the operative field. A timeout was performed prior to the initiation of the procedure. Local anesthesia was provided with 1% lidocaine. Under direct ultrasound guidance, 5 FNA biopsies were performed of the right inferior thyroid nodule with a 25 gauge needle. 2 of these samples were obtained for [facility] per ordering MD Multiple ultrasound images were saved for procedural  documentation purposes. The samples were prepared and submitted to pathology. Limited post procedural scanning was negative for hematoma or additional complication. Dressings were placed. The patient tolerated the above procedures procedure well without immediate postprocedural complication. FINDINGS: Nodule reference number based on prior diagnostic ultrasound: 1 Maximum size: 2.5 cm Location: Right; Inferior ACR TI-RADS risk category: TR4 (4-6 points) Reason for biopsy: meets ACR TI-RADS criteria Ultrasound imaging confirms appropriate placement of the needles within the thyroid nodule. IMPRESSION:  Technically successful ultrasound guided fine needle aspiration of right inferior thyroid nodule Read by Lavonia Drafts [facility] Electronically Signed   By: Jerilynn Mages.  Shick M.D.   On: 02/25/2018 15:07   US Thyroid  Result Date: 02/11/2018 CLINICAL DATA:  Goiter EXAM: THYROID ULTRASOUND TECHNIQUE: Ultrasound examination of the thyroid gland and adjacent soft tissues was performed. COMPARISON:  10/24/2016, 09/20/2013 FINDINGS: Parenchymal Echotexture: Mildly heterogenous Isthmus: 0.4 cm thickness, stable Right lobe: 5.3 x 2.2 x 2.8 cm, previously 5.9 x 2.1 x 2.7 Left lobe: 5 x 2 x 2 cm, previously 5.3 x 1.8 x 2 _________________________________________________________ Estimated total number of nodules >/= 1 cm: 1 Number of spongiform nodules >/=  2 cm not described below (TR1): 0 Number of mixed cystic and solid nodules >/= 1.5 cm not described below ([place]): 0 _________________________________________________________ Nodule # 1: Prior biopsy: No Location: Right; Inferior Maximum size: 2.5 cm; Other 2 dimensions: 1.4 x 2 cm, previously, 2.4 x 1.2 x 1.7 cm Composition: solid/almost completely solid (2) Echogenicity: isoechoic (1) Shape: not taller-than-wide (0) Margins: ill-defined (0) Echogenic foci: punctate echogenic foci (3) ACR TI-RADS total points: 6. ACR TI-RADS risk category:  TR4 (4-6 points). Significant change in size  (>/= 20% in two dimensions and minimal increase of 2 mm): No Change in features: No Change in ACR TI-RADS risk category: No ACR TI-RADS recommendations: **Given size (>/= 1.5 cm) and appearance, fine needle aspiration of this moderately suspicious nodule should be considered based on TI-RADS criteria. _________________________________________________________ 0.6 x 0.3 x 0.4 cm complex nodule, mid right, stable 0.5 x 0.3 cm isoechoic nodule, mid right, stable 0.6 x 0.4 cm hypoechoic nodule, inferior left, previously 0.4 x 0.3 IMPRESSION: 1. Thyromegaly with stable bilateral nodules. 2. Recommend FNA biopsy of moderately suspicious inferior right 2.5 cm nodule. The above is in keeping with the ACR TI-RADS recommendations - J Am Coll Radiol 2017;14:587-595. Electronically Signed   By: Lucrezia Europe M.D.   On: 02/11/2018 16:23    All questions were answered. The patient knows to call the clinic with any problems, questions or concerns. No barriers to learning was detected.  I spent 15 minutes counseling the patient face to face. The total time spent in the appointment was 20 minutes and more than 50% was on counseling and review of test results  Heath Lark, MD 03/06/2018 10:00 AM

## 2018-03-06 NOTE — Telephone Encounter (Signed)
-----   Message from Heath Lark, MD sent at 03/06/2018 10:58 AM EDT ----- Regarding: all labs normal pls call her and let her know all labs are normal ----- Message ----- From: Interface, [facility] [place] Sent: 03/06/2018   9:34 AM To: Heath Lark, MD

## 2018-03-06 NOTE — Assessment & Plan Note (Signed)
Her examination is benign I recommend her to quit smoking I will see her once a year with history, physical examination and blood work only

## 2018-04-15 DIAGNOSIS — J209 Acute bronchitis, unspecified: Secondary | ICD-10-CM | POA: Diagnosis not present

## 2018-05-04 DIAGNOSIS — R05 Cough: Secondary | ICD-10-CM | POA: Diagnosis not present

## 2018-05-27 DIAGNOSIS — R05 Cough: Secondary | ICD-10-CM | POA: Diagnosis not present

## 2018-05-27 DIAGNOSIS — C8590 Non-Hodgkin lymphoma, unspecified, unspecified site: Secondary | ICD-10-CM | POA: Diagnosis not present

## 2018-05-27 DIAGNOSIS — M150 Primary generalized (osteo)arthritis: Secondary | ICD-10-CM | POA: Diagnosis not present

## 2018-05-27 DIAGNOSIS — M545 Low back pain: Secondary | ICD-10-CM | POA: Diagnosis not present

## 2018-06-08 DIAGNOSIS — L821 Other seborrheic keratosis: Secondary | ICD-10-CM | POA: Diagnosis not present

## 2018-06-08 DIAGNOSIS — D229 Melanocytic nevi, unspecified: Secondary | ICD-10-CM | POA: Diagnosis not present

## 2018-06-08 DIAGNOSIS — L820 Inflamed seborrheic keratosis: Secondary | ICD-10-CM | POA: Diagnosis not present

## 2018-06-08 DIAGNOSIS — D1801 Hemangioma of skin and subcutaneous tissue: Secondary | ICD-10-CM | POA: Diagnosis not present

## 2018-08-31 DIAGNOSIS — G8929 Other chronic pain: Secondary | ICD-10-CM | POA: Diagnosis not present

## 2018-08-31 DIAGNOSIS — C8590 Non-Hodgkin lymphoma, unspecified, unspecified site: Secondary | ICD-10-CM | POA: Diagnosis not present

## 2018-08-31 DIAGNOSIS — M150 Primary generalized (osteo)arthritis: Secondary | ICD-10-CM | POA: Diagnosis not present

## 2018-10-12 DIAGNOSIS — D3500 Benign neoplasm of unspecified adrenal gland: Secondary | ICD-10-CM | POA: Diagnosis not present

## 2018-11-26 DIAGNOSIS — R2231 Localized swelling, mass and lump, right upper limb: Secondary | ICD-10-CM | POA: Diagnosis not present

## 2018-11-26 DIAGNOSIS — R2232 Localized swelling, mass and lump, left upper limb: Secondary | ICD-10-CM | POA: Diagnosis not present

## 2018-12-11 DIAGNOSIS — R7301 Impaired fasting glucose: Secondary | ICD-10-CM | POA: Diagnosis not present

## 2018-12-11 DIAGNOSIS — D3500 Benign neoplasm of unspecified adrenal gland: Secondary | ICD-10-CM | POA: Diagnosis not present

## 2018-12-11 DIAGNOSIS — E049 Nontoxic goiter, unspecified: Secondary | ICD-10-CM | POA: Diagnosis not present

## 2018-12-11 DIAGNOSIS — Z13220 Encounter for screening for lipoid disorders: Secondary | ICD-10-CM | POA: Diagnosis not present

## 2018-12-18 DIAGNOSIS — D3500 Benign neoplasm of unspecified adrenal gland: Secondary | ICD-10-CM | POA: Diagnosis not present

## 2018-12-18 DIAGNOSIS — R7301 Impaired fasting glucose: Secondary | ICD-10-CM | POA: Diagnosis not present

## 2018-12-18 DIAGNOSIS — E049 Nontoxic goiter, unspecified: Secondary | ICD-10-CM | POA: Diagnosis not present

## 2018-12-18 DIAGNOSIS — E249 Cushing's syndrome, unspecified: Secondary | ICD-10-CM | POA: Diagnosis not present

## 2018-12-21 DIAGNOSIS — M545 Low back pain: Secondary | ICD-10-CM | POA: Diagnosis not present

## 2018-12-21 DIAGNOSIS — C8590 Non-Hodgkin lymphoma, unspecified, unspecified site: Secondary | ICD-10-CM | POA: Diagnosis not present

## 2018-12-21 DIAGNOSIS — M150 Primary generalized (osteo)arthritis: Secondary | ICD-10-CM | POA: Diagnosis not present

## 2019-03-01 ENCOUNTER — Other Ambulatory Visit: Payer: Self-pay | Admitting: Hematology and Oncology

## 2019-03-01 DIAGNOSIS — C8336 Diffuse large B-cell lymphoma, intrapelvic lymph nodes: Secondary | ICD-10-CM

## 2019-03-05 ENCOUNTER — Inpatient Hospital Stay: Payer: BLUE CROSS/BLUE SHIELD | Admitting: Hematology and Oncology

## 2019-03-05 ENCOUNTER — Inpatient Hospital Stay: Payer: BLUE CROSS/BLUE SHIELD

## 2019-03-09 ENCOUNTER — Telehealth: Payer: Self-pay | Admitting: Hematology and Oncology

## 2019-03-09 NOTE — Telephone Encounter (Signed)
Scheduled appt per sch msg. Called and left msg.  °

## 2019-03-23 ENCOUNTER — Inpatient Hospital Stay: Payer: BLUE CROSS/BLUE SHIELD | Attending: Hematology and Oncology | Admitting: Hematology and Oncology

## 2019-03-23 ENCOUNTER — Other Ambulatory Visit: Payer: Self-pay

## 2019-03-23 ENCOUNTER — Inpatient Hospital Stay: Payer: BLUE CROSS/BLUE SHIELD

## 2019-03-23 DIAGNOSIS — Z8572 Personal history of non-Hodgkin lymphomas: Secondary | ICD-10-CM | POA: Diagnosis not present

## 2019-03-23 DIAGNOSIS — Z79899 Other long term (current) drug therapy: Secondary | ICD-10-CM | POA: Diagnosis not present

## 2019-03-23 DIAGNOSIS — R229 Localized swelling, mass and lump, unspecified: Secondary | ICD-10-CM | POA: Diagnosis not present

## 2019-03-23 DIAGNOSIS — Z720 Tobacco use: Secondary | ICD-10-CM

## 2019-03-23 DIAGNOSIS — Z9221 Personal history of antineoplastic chemotherapy: Secondary | ICD-10-CM | POA: Diagnosis not present

## 2019-03-23 DIAGNOSIS — Z923 Personal history of irradiation: Secondary | ICD-10-CM

## 2019-03-23 DIAGNOSIS — F17210 Nicotine dependence, cigarettes, uncomplicated: Secondary | ICD-10-CM

## 2019-03-24 ENCOUNTER — Encounter: Payer: Self-pay | Admitting: Hematology and Oncology

## 2019-03-24 NOTE — Progress Notes (Signed)
[facility] OFFICE PROGRESS NOTE  Patient Care Team: Maurice Small, MD as PCP - General (Family Medicine) Stark Klein, MD as Consulting Physician (General Surgery) Izora Gala, MD as Consulting Physician (Otolaryngology)  ASSESSMENT & PLAN:  History of B-cell lymphoma Clinically, she have no signs of cancer recurrence We discussed the importance of nicotine cessation I will see her again in 1 year with history, physical examination and blood work However, the patient is concerned about copayment for blood draw and I recommend she discuss with her primary care doctor to have a CBC drawn in her yearly doctor's visit to save her the co-pay  Tobacco abuse I spent some time counseling the patient the importance of tobacco cessation. She is currently interested to quit on her own    No orders of the defined types were placed in this encounter.   INTERVAL HISTORY: Please see below for problem oriented charting. She returns for further follow-up She declined blood draw today No new lymphadenopathy Denies recent infection, fever or chills She continues to smoke three-quarter of a pack of cigarettes per day and is trying to quit on her own She saw a surgeon recently for persistent palpable skin nodules that comes and goes  SUMMARY OF ONCOLOGIC HISTORY: Oncology History   Diffuse large B cell lymphoma   Staging form: Lymphoid Neoplasms, AJCC 6th Edition     Clinical stage from 12/01/2014: Stage I - Signed by Heath Lark, MD on 12/01/2014        History of B-cell lymphoma   09/08/2014 Imaging    Ultrasound pelvis detected abnormal soft tissue mass in the left groin    09/13/2014 Imaging    Ultrasound abdomen showed up to 7 cm mass-like area of mild hypoechogenicity in the left hepatic lobe    09/21/2014 Imaging    MRI of the abdomen showed 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    11/23/2014 Bone Marrow Biopsy    Accession: [id number] is negative for lymphoma    11/30/2014 - 01/11/2015 Chemotherapy    She received chemotherapy with RCHOP X 3 cycles    11/30/2014 Adverse Reaction    Treatment was complicated by mild hypersensitivity reaction to rituximab    01/30/2015 Imaging    Repeat PET CT scan showed complete response to the lymphadenopathy.    02/23/2015 - 03/22/2015 Radiation Therapy    She received radiation therapy 36 Gy in 20 fractions to left groin area    08/01/2015 Imaging    PET scan showed complete response to Rx    08/13/2016 Imaging    Ct scan showed no adenopathy identified within the abdomen or pelvis.    08/20/2017 Imaging    1. No findings of active lymphoma. 2. Hepatic hemangiomas and bilateral adrenal adenomas. 3. Right posterior thyroid lobe nodule was not previously hypermetabolic and accordingly is probably benign. 4. Calcified granuloma in the left upper lobe. No worrisome pulmonary nodule. 5.  Aortic Atherosclerosis (ICD10-I70.0). 6. Mild lumbar scoliosis and degenerative disc disease. 7. Diffuse hepatic steatosis.     REVIEW OF SYSTEMS:   Constitutional: Denies fevers, chills or abnormal weight loss  Eyes: Denies blurriness of vision Ears, nose, mouth, throat, and face: Denies mucositis or sore throat Respiratory: Denies cough, dyspnea or wheezes Cardiovascular: Denies palpitation, chest discomfort or lower extremity swelling Gastrointestinal:  Denies nausea, heartburn or change in bowel habits Skin: Denies abnormal skin rashes Lymphatics: Denies new lymphadenopathy or easy bruising Neurological:Denies numbness, tingling or new  weaknesses Behavioral/Psych: Mood is stable, no new changes  All other systems were reviewed with the patient and are negative.  I have reviewed the past medical history, past surgical history, social history and family history with the patient and they are unchanged from previous note.  ALLERGIES:  is allergic to adhesive [tape]; influenza vaccines; prednisone; and sulfa antibiotics.  MEDICATIONS:  Current Outpatient Medications  Medication Sig Dispense Refill  . folic acid (FOLVITE) 1 MG tablet Take 1 mg by mouth daily.    . Glucosamine-Chondroit-Vit C-Mn (GLUCOSAMINE CHONDR 1500 COMPLX PO) Take 1 tablet by mouth daily.     Marland Kitchen ibuprofen (ADVIL) 200 MG tablet Take 200 mg by mouth every 6 (six) hours as needed.    . Multiple Vitamins-Minerals (MULTIVITAMIN PO) Take by mouth daily.    . Omega-3 Fatty Acids (FISH OIL PO) Take by mouth.    . Probiotic Product (PROBIOTIC DAILY PO) Take by mouth.    . Turmeric, Curcuma Longa, (CURCUMIN) POWD by Does not apply route daily.    Marland Kitchen UNABLE TO FIND Take 1 tablet by mouth at bedtime. Med Name: Supplement "DGL"     No current facility-administered medications for this visit.     PHYSICAL EXAMINATION: ECOG PERFORMANCE STATUS: 0 - Asymptomatic  Vitals:   03/23/19 0921  BP: 138/84  Pulse: 69  Resp: 18  Temp: 98 F (36.7 C)  SpO2: 99%   Filed Weights   03/23/19 0921  Weight: 166 lb (75.3 kg)    GENERAL:alert, no distress and comfortable SKIN: skin color, texture, turgor are normal, no rashes or significant lesions EYES: normal, Conjunctiva are pink and non-injected, sclera clear OROPHARYNX:no exudate, no erythema and lips, buccal mucosa, and tongue normal  NECK: supple, thyroid normal size, non-tender, without nodularity LYMPH:  no palpable lymphadenopathy in the cervical, axillary or inguinal LUNGS: clear to auscultation and percussion with normal breathing effort HEART: regular rate & rhythm and no murmurs and no lower extremity  edema ABDOMEN:abdomen soft, non-tender and normal bowel sounds Musculoskeletal:no cyanosis of digits and no clubbing  NEURO: alert & oriented x 3 with fluent speech, no focal motor/sensory deficits  LABORATORY DATA:  I have reviewed the data as listed    Component Value Date/Time   NA 141 03/06/2018 0917   NA 141 08/14/2017 0931   K 4.3 03/06/2018 0917   K 4.3 08/14/2017 0931   CL 108 03/06/2018 0917   CO2 25 03/06/2018 0917   CO2 26 08/14/2017 0931   GLUCOSE 108 03/06/2018 0917   GLUCOSE 100 08/14/2017 0931   BUN 16 03/06/2018 0917   BUN 13.0 08/14/2017 0931   CREATININE 0.83 03/06/2018 0917   CREATININE 0.8 08/14/2017 0931   CALCIUM 9.5 03/06/2018 0917   CALCIUM 10.1 08/14/2017 0931   PROT 7.3 03/06/2018 0917   PROT 7.6 08/14/2017 0931   ALBUMIN 4.0 03/06/2018 0917   ALBUMIN 4.0 08/14/2017 0931   AST 22 03/06/2018 0917   AST 24 08/14/2017 0931   ALT 40 03/06/2018 0917   ALT 29 08/14/2017 0931   ALKPHOS 50 03/06/2018 0917   ALKPHOS 49 08/14/2017 0931   BILITOT 0.4 03/06/2018 1096  BILITOT 0.38 08/14/2017 0931   GFRNONAA >60 03/06/2018 0917   GFRAA >60 03/06/2018 0917    No results found for: SPEP, UPEP  Lab Results  Component Value Date   WBC 5.4 03/06/2018   NEUTROABS 3.3 03/06/2018   HGB 14.6 03/06/2018   HCT 44.2 03/06/2018   MCV 98.0 03/06/2018   PLT 277 03/06/2018      Chemistry      Component Value Date/Time   NA 141 03/06/2018 0917   NA 141 08/14/2017 0931   K 4.3 03/06/2018 0917   K 4.3 08/14/2017 0931   CL 108 03/06/2018 0917   CO2 25 03/06/2018 0917   CO2 26 08/14/2017 0931   BUN 16 03/06/2018 0917   BUN 13.0 08/14/2017 0931   CREATININE 0.83 03/06/2018 0917   CREATININE 0.8 08/14/2017 0931      Component Value Date/Time   CALCIUM 9.5 03/06/2018 0917   CALCIUM 10.1 08/14/2017 0931   ALKPHOS 50 03/06/2018 0917   ALKPHOS 49 08/14/2017 0931   AST 22 03/06/2018 0917   AST 24 08/14/2017 0931   ALT 40 03/06/2018 0917   ALT 29 08/14/2017  0931   BILITOT 0.4 03/06/2018 0917   BILITOT 0.38 08/14/2017 0931       All questions were answered. The patient knows to call the clinic with any problems, questions or concerns. No barriers to learning was detected.  I spent 10 minutes counseling the patient face to face. The total time spent in the appointment was 15 minutes and more than 50% was on counseling and review of test results  Heath Lark, MD 03/24/2019 7:33 AM

## 2019-03-24 NOTE — Assessment & Plan Note (Signed)
I spent some time counseling the patient the importance of tobacco cessation. She is currently interested to quit on her own

## 2019-03-24 NOTE — Assessment & Plan Note (Signed)
Clinically, she have no signs of cancer recurrence We discussed the importance of nicotine cessation I will see her again in 1 year with history, physical examination and blood work However, the patient is concerned about copayment for blood draw and I recommend she discuss with her primary care doctor to have a CBC drawn in her yearly doctor's visit to save her the co-pay

## 2019-04-26 DIAGNOSIS — Z20828 Contact with and (suspected) exposure to other viral communicable diseases: Secondary | ICD-10-CM | POA: Diagnosis not present

## 2019-04-27 DIAGNOSIS — Z20828 Contact with and (suspected) exposure to other viral communicable diseases: Secondary | ICD-10-CM | POA: Diagnosis not present

## 2019-04-27 DIAGNOSIS — R05 Cough: Secondary | ICD-10-CM | POA: Diagnosis not present

## 2019-06-09 ENCOUNTER — Other Ambulatory Visit: Payer: Self-pay | Admitting: Family Medicine

## 2019-06-09 DIAGNOSIS — Z1231 Encounter for screening mammogram for malignant neoplasm of breast: Secondary | ICD-10-CM

## 2019-06-21 DIAGNOSIS — M545 Low back pain: Secondary | ICD-10-CM | POA: Diagnosis not present

## 2019-06-21 DIAGNOSIS — G8929 Other chronic pain: Secondary | ICD-10-CM | POA: Diagnosis not present

## 2019-06-21 DIAGNOSIS — M150 Primary generalized (osteo)arthritis: Secondary | ICD-10-CM | POA: Diagnosis not present

## 2019-06-21 DIAGNOSIS — C8590 Non-Hodgkin lymphoma, unspecified, unspecified site: Secondary | ICD-10-CM | POA: Diagnosis not present

## 2019-07-27 ENCOUNTER — Ambulatory Visit: Payer: BLUE CROSS/BLUE SHIELD

## 2019-07-27 DIAGNOSIS — Z79899 Other long term (current) drug therapy: Secondary | ICD-10-CM | POA: Diagnosis not present

## 2019-07-27 DIAGNOSIS — L689 Hypertrichosis, unspecified: Secondary | ICD-10-CM | POA: Diagnosis not present

## 2019-07-27 DIAGNOSIS — D3500 Benign neoplasm of unspecified adrenal gland: Secondary | ICD-10-CM | POA: Diagnosis not present

## 2019-07-27 DIAGNOSIS — E249 Cushing's syndrome, unspecified: Secondary | ICD-10-CM | POA: Diagnosis not present

## 2019-07-28 DIAGNOSIS — H9202 Otalgia, left ear: Secondary | ICD-10-CM | POA: Diagnosis not present

## 2019-08-26 DIAGNOSIS — F17200 Nicotine dependence, unspecified, uncomplicated: Secondary | ICD-10-CM | POA: Diagnosis not present

## 2019-08-26 DIAGNOSIS — K219 Gastro-esophageal reflux disease without esophagitis: Secondary | ICD-10-CM | POA: Diagnosis not present

## 2019-08-26 DIAGNOSIS — R490 Dysphonia: Secondary | ICD-10-CM | POA: Diagnosis not present

## 2019-08-26 DIAGNOSIS — M26629 Arthralgia of temporomandibular joint, unspecified side: Secondary | ICD-10-CM | POA: Diagnosis not present

## 2019-09-06 ENCOUNTER — Ambulatory Visit
Admission: RE | Admit: 2019-09-06 | Discharge: 2019-09-06 | Disposition: A | Discharge status: Home / Self Care | Payer: BC Managed Care – PPO | Source: Ambulatory Visit | Attending: Family Medicine | Admitting: Family Medicine

## 2019-09-06 ENCOUNTER — Other Ambulatory Visit: Payer: Self-pay

## 2019-09-06 DIAGNOSIS — Z1231 Encounter for screening mammogram for malignant neoplasm of breast: Secondary | ICD-10-CM | POA: Diagnosis not present

## 2019-09-15 ENCOUNTER — Other Ambulatory Visit (HOSPITAL_COMMUNITY)
Admission: RE | Admit: 2019-09-15 | Discharge: 2019-09-15 | Disposition: A | Discharge status: Home / Self Care | Payer: BC Managed Care – PPO | Source: Ambulatory Visit | Attending: Family Medicine | Admitting: Family Medicine

## 2019-09-15 DIAGNOSIS — Z01411 Encounter for gynecological examination (general) (routine) with abnormal findings: Secondary | ICD-10-CM | POA: Insufficient documentation

## 2019-09-15 DIAGNOSIS — Z124 Encounter for screening for malignant neoplasm of cervix: Secondary | ICD-10-CM | POA: Diagnosis not present

## 2019-09-15 DIAGNOSIS — Z0000 Encounter for general adult medical examination without abnormal findings: Secondary | ICD-10-CM | POA: Diagnosis not present

## 2019-09-17 LAB — CYTOLOGY - PAP
Comment: NEGATIVE
Diagnosis: NEGATIVE
High risk HPV: NEGATIVE

## 2019-09-21 DIAGNOSIS — Z79899 Other long term (current) drug therapy: Secondary | ICD-10-CM | POA: Diagnosis not present

## 2019-09-21 DIAGNOSIS — E249 Cushing's syndrome, unspecified: Secondary | ICD-10-CM | POA: Diagnosis not present

## 2019-09-21 DIAGNOSIS — L689 Hypertrichosis, unspecified: Secondary | ICD-10-CM | POA: Diagnosis not present

## 2019-09-27 DIAGNOSIS — E249 Cushing's syndrome, unspecified: Secondary | ICD-10-CM | POA: Diagnosis not present

## 2019-10-12 DIAGNOSIS — E249 Cushing's syndrome, unspecified: Secondary | ICD-10-CM | POA: Diagnosis not present

## 2019-12-20 DIAGNOSIS — C8590 Non-Hodgkin lymphoma, unspecified, unspecified site: Secondary | ICD-10-CM | POA: Diagnosis not present

## 2019-12-20 DIAGNOSIS — Z87891 Personal history of nicotine dependence: Secondary | ICD-10-CM | POA: Diagnosis not present

## 2019-12-20 DIAGNOSIS — M150 Primary generalized (osteo)arthritis: Secondary | ICD-10-CM | POA: Diagnosis not present

## 2019-12-20 DIAGNOSIS — G8929 Other chronic pain: Secondary | ICD-10-CM | POA: Diagnosis not present

## 2019-12-20 DIAGNOSIS — M545 Low back pain: Secondary | ICD-10-CM | POA: Diagnosis not present

## 2019-12-24 DIAGNOSIS — R7301 Impaired fasting glucose: Secondary | ICD-10-CM | POA: Diagnosis not present

## 2019-12-24 DIAGNOSIS — E049 Nontoxic goiter, unspecified: Secondary | ICD-10-CM | POA: Diagnosis not present

## 2019-12-24 DIAGNOSIS — E1169 Type 2 diabetes mellitus with other specified complication: Secondary | ICD-10-CM | POA: Diagnosis not present

## 2019-12-24 DIAGNOSIS — D3500 Benign neoplasm of unspecified adrenal gland: Secondary | ICD-10-CM | POA: Diagnosis not present

## 2019-12-24 DIAGNOSIS — E249 Cushing's syndrome, unspecified: Secondary | ICD-10-CM | POA: Diagnosis not present

## 2019-12-29 ENCOUNTER — Other Ambulatory Visit: Payer: Self-pay | Admitting: Endocrinology

## 2019-12-29 DIAGNOSIS — E049 Nontoxic goiter, unspecified: Secondary | ICD-10-CM

## 2019-12-31 DIAGNOSIS — E249 Cushing's syndrome, unspecified: Secondary | ICD-10-CM | POA: Diagnosis not present

## 2020-01-11 ENCOUNTER — Telehealth: Payer: Self-pay | Admitting: *Deleted

## 2020-01-11 ENCOUNTER — Telehealth: Payer: Self-pay

## 2020-01-11 NOTE — Telephone Encounter (Signed)
She called and left a message. Is it okay to get the covid vaccine? Do you have a preference on which vaccine? She has a history of allergic reaction to flu vaccine.

## 2020-01-11 NOTE — Telephone Encounter (Signed)
Ok to proceed with vaccine No preference

## 2020-01-11 NOTE — Telephone Encounter (Signed)
Per Dr. Alvy Bimler, left voicemail advising pt to proceed with vaccine and there is no preference for the vaccine

## 2020-02-14 ENCOUNTER — Ambulatory Visit
Admission: RE | Admit: 2020-02-14 | Discharge: 2020-02-14 | Disposition: A | Discharge status: Home / Self Care | Payer: BC Managed Care – PPO | Source: Ambulatory Visit | Attending: Endocrinology | Admitting: Endocrinology

## 2020-02-14 DIAGNOSIS — E049 Nontoxic goiter, unspecified: Secondary | ICD-10-CM

## 2020-02-14 DIAGNOSIS — E041 Nontoxic single thyroid nodule: Secondary | ICD-10-CM | POA: Diagnosis not present

## 2020-03-02 DIAGNOSIS — E249 Cushing's syndrome, unspecified: Secondary | ICD-10-CM | POA: Diagnosis not present

## 2020-03-17 DIAGNOSIS — Z720 Tobacco use: Secondary | ICD-10-CM | POA: Diagnosis not present

## 2020-03-23 ENCOUNTER — Other Ambulatory Visit: Payer: Self-pay

## 2020-03-23 ENCOUNTER — Encounter: Payer: Self-pay | Admitting: Hematology and Oncology

## 2020-03-23 ENCOUNTER — Telehealth: Payer: Self-pay | Admitting: Hematology and Oncology

## 2020-03-23 ENCOUNTER — Inpatient Hospital Stay: Payer: BC Managed Care – PPO

## 2020-03-23 ENCOUNTER — Inpatient Hospital Stay: Payer: BC Managed Care – PPO | Attending: Hematology and Oncology | Admitting: Hematology and Oncology

## 2020-03-23 VITALS — BP 141/76 | HR 74 | Temp 98.7°F | Resp 18 | Ht 63.0 in | Wt 178.2 lb

## 2020-03-23 DIAGNOSIS — Z9221 Personal history of antineoplastic chemotherapy: Secondary | ICD-10-CM | POA: Diagnosis not present

## 2020-03-23 DIAGNOSIS — Z8572 Personal history of non-Hodgkin lymphomas: Secondary | ICD-10-CM

## 2020-03-23 DIAGNOSIS — R5383 Other fatigue: Secondary | ICD-10-CM

## 2020-03-23 DIAGNOSIS — Z299 Encounter for prophylactic measures, unspecified: Secondary | ICD-10-CM

## 2020-03-23 DIAGNOSIS — Z923 Personal history of irradiation: Secondary | ICD-10-CM | POA: Insufficient documentation

## 2020-03-23 DIAGNOSIS — Z720 Tobacco use: Secondary | ICD-10-CM | POA: Diagnosis not present

## 2020-03-23 LAB — CBC WITH DIFFERENTIAL/PLATELET
Abs Immature Granulocytes: 0.02 10*3/uL (ref 0.00–0.07)
Basophils Absolute: 0.1 10*3/uL (ref 0.0–0.1)
Basophils Relative: 1 %
Eosinophils Absolute: 0.2 10*3/uL (ref 0.0–0.5)
Eosinophils Relative: 3 %
HCT: 42.1 % (ref 36.0–46.0)
Hemoglobin: 14.1 g/dL (ref 12.0–15.0)
Immature Granulocytes: 0 %
Lymphocytes Relative: 27 %
Lymphs Abs: 1.6 10*3/uL (ref 0.7–4.0)
MCH: 32.9 pg (ref 26.0–34.0)
MCHC: 33.5 g/dL (ref 30.0–36.0)
MCV: 98.4 fL (ref 80.0–100.0)
Monocytes Absolute: 0.4 10*3/uL (ref 0.1–1.0)
Monocytes Relative: 7 %
Neutro Abs: 3.5 10*3/uL (ref 1.7–7.7)
Neutrophils Relative %: 62 %
Platelets: 262 10*3/uL (ref 150–400)
RBC: 4.28 MIL/uL (ref 3.87–5.11)
RDW: 14.2 % (ref 11.5–15.5)
WBC: 5.7 10*3/uL (ref 4.0–10.5)
nRBC: 0 % (ref 0.0–0.2)

## 2020-03-23 LAB — COMPREHENSIVE METABOLIC PANEL
ALT: 42 U/L (ref 0–44)
AST: 26 U/L (ref 15–41)
Albumin: 3.7 g/dL (ref 3.5–5.0)
Alkaline Phosphatase: 45 U/L (ref 38–126)
Anion gap: 7 (ref 5–15)
BUN: 12 mg/dL (ref 6–20)
CO2: 27 mmol/L (ref 22–32)
Calcium: 9.5 mg/dL (ref 8.9–10.3)
Chloride: 108 mmol/L (ref 98–111)
Creatinine, Ser: 0.75 mg/dL (ref 0.44–1.00)
GFR calc Af Amer: >60 mL/min (ref >60)
GFR calc non Af Amer: >60 mL/min (ref >60)
Glucose, Bld: 107 mg/dL — ABNORMAL HIGH (ref 70–99)
Potassium: 4.3 mmol/L (ref 3.5–5.1)
Sodium: 142 mmol/L (ref 135–145)
Total Bilirubin: 0.3 mg/dL (ref 0.3–1.2)
Total Protein: 6.8 g/dL (ref 6.5–8.1)

## 2020-03-23 LAB — T4, FREE: Free T4: 1 ng/dL (ref 0.61–1.12)

## 2020-03-23 NOTE — Assessment & Plan Note (Signed)
I spent some time counseling the patient the importance of tobacco cessation. She is currently interested to quit on her own

## 2020-03-23 NOTE — Telephone Encounter (Signed)
Scheduled appts per 5/27 sch msg. Pt confirmed appt date and time.  °

## 2020-03-23 NOTE — Assessment & Plan Note (Signed)
Clinically, she have no signs of cancer recurrence We discussed the importance of nicotine cessation I will see her again in 1 year with history, physical examination and blood work

## 2020-03-23 NOTE — Assessment & Plan Note (Signed)
We discussed the importance of Covid vaccination I recommend she proceed

## 2020-03-23 NOTE — Progress Notes (Signed)
[place] OFFICE PROGRESS NOTE  Patient Care Team: Maurice Small, MD as PCP - General (Family Medicine) Stark Klein, MD as Consulting Physician (General Surgery) Izora Gala, MD as Consulting Physician (Otolaryngology)  ASSESSMENT & PLAN:  History of B-cell lymphoma Clinically, she have no signs of cancer recurrence We discussed the importance of nicotine cessation I will see her again in 1 year with history, physical examination and blood work  Tobacco abuse I spent some time counseling the patient the importance of tobacco cessation. She is currently interested to quit on her own   Preventive measure We discussed the importance of Covid vaccination I recommend she proceed  Other fatigue She complained of fatigue Thyroid function test is within normal limits   Orders Placed This Encounter  Procedures  . CBC with Differential/Platelet    Standing Status:   Future    Number of Occurrences:   1    Standing Expiration Date:   03/23/2021  . Comprehensive metabolic panel    Standing Status:   Future    Number of Occurrences:   1    Standing Expiration Date:   03/23/2021  . Lactate dehydrogenase    Standing Status:   Future    Number of Occurrences:   1    Standing Expiration Date:   03/23/2021  . TSH    Standing Status:   Future    Number of Occurrences:   1    Standing Expiration Date:   03/23/2021  . T4, free    Standing Status:   Future    Number of Occurrences:   1    Standing Expiration Date:   03/23/2021    All questions were answered. The patient knows to call the clinic with any problems, questions or concerns. The total time spent in the appointment was 20 minutes encounter with patients including review of chart and various tests results, discussions about plan of care and coordination of care plan   Heath Lark, MD 03/23/2020 2:19 PM  INTERVAL HISTORY: Please see below for problem oriented charting. She returns for further follow-up She is  following up with endocrinologist at [facility] She is concerned about the prominent fat pad on both sides of the neck No new lymphadenopathy No recent infection, fever or chills She continues to smoke approximately 12 cigarettes/day but she believe she is making good progress towards quitting smoking  SUMMARY OF ONCOLOGIC HISTORY: Oncology History Overview Note  Diffuse large B cell lymphoma   Staging form: Lymphoid Neoplasms, AJCC 6th Edition     Clinical stage from 12/01/2014: Stage I - Signed by Heath Lark, MD on 12/01/2014      History of B-cell lymphoma  09/08/2014 Imaging   Ultrasound pelvis detected abnormal soft tissue mass in the left groin   09/13/2014 Imaging   Ultrasound abdomen showed up to 7 cm mass-like area of mild hypoechogenicity in the left hepatic lobe   09/21/2014 Imaging   MRI of the abdomen showed 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   11/23/2014 Bone Marrow Biopsy   Accession: [id number] is negative for lymphoma   11/30/2014 - 01/11/2015 Chemotherapy   She received chemotherapy with RCHOP  X 3 cycles   11/30/2014 Adverse Reaction   Treatment was complicated by mild hypersensitivity reaction to rituximab   01/30/2015 Imaging   Repeat PET CT scan showed complete response to the lymphadenopathy.   02/23/2015 - 03/22/2015 Radiation Therapy   She received radiation therapy 36 Gy in 20 fractions to left groin area   08/01/2015 Imaging   PET scan showed complete response to Rx   08/13/2016 Imaging   Ct scan showed no adenopathy identified within the abdomen or pelvis.   08/20/2017 Imaging   1. No findings of active  lymphoma. 2. Hepatic hemangiomas and bilateral adrenal adenomas. 3. Right posterior thyroid lobe nodule was not previously hypermetabolic and accordingly is probably benign. 4. Calcified granuloma in the left upper lobe. No worrisome pulmonary nodule. 5.  Aortic Atherosclerosis (ICD10-I70.0). 6. Mild lumbar scoliosis and degenerative disc disease. 7. Diffuse hepatic steatosis.     REVIEW OF SYSTEMS:   Constitutional: Denies fevers, chills or abnormal weight loss Eyes: Denies blurriness of vision Ears, nose, mouth, throat, and face: Denies mucositis or sore throat Respiratory: Denies cough, dyspnea or wheezes Cardiovascular: Denies palpitation, chest discomfort or lower extremity swelling Gastrointestinal:  Denies nausea, heartburn or change in bowel habits Skin: Denies abnormal skin rashes Lymphatics: Denies new lymphadenopathy or easy bruising Neurological:Denies numbness, tingling or new weaknesses Behavioral/Psych: Mood is stable, no new changes  All other systems were reviewed with the patient and are negative.  I have reviewed the past medical history, past surgical history, social history and family history with the patient and they are unchanged from previous note.  ALLERGIES:  is allergic to adhesive [tape]; influenza vaccines; prednisone; and sulfa antibiotics.  MEDICATIONS:  Current Outpatient Medications  Medication Sig Dispense Refill  . Glucosamine-Chondroit-Vit C-Mn (GLUCOSAMINE CHONDR 1500 COMPLX PO) Take 1 tablet by mouth daily.     Marland Kitchen ibuprofen (ADVIL) 200 MG tablet Take 200 mg by mouth every 6 (six) hours as needed.    . Multiple Vitamins-Minerals (MULTIVITAMIN PO) Take by mouth daily.    . Omega-3 Fatty Acids (FISH OIL PO) Take by mouth.    . Probiotic Product (PROBIOTIC DAILY PO) Take by mouth.    . Turmeric, Curcuma Longa, (CURCUMIN) POWD by Does not apply route daily.    Marland Kitchen UNABLE TO FIND Take 1 tablet by mouth at bedtime. Med Name: Supplement "DGL"     No  current facility-administered medications for this visit.    PHYSICAL EXAMINATION: ECOG PERFORMANCE STATUS: 1 - Symptomatic but completely ambulatory  Vitals:   03/23/20 0919  BP: (!) 141/76  Pulse: 74  Resp: 18  Temp: 98.7 F (37.1 C)  SpO2: 98%   Filed Weights   03/23/20 0919  Weight: 178 lb 3.2 oz (80.8 kg)    GENERAL:alert, no distress and comfortable SKIN: skin color, texture, turgor are normal, no rashes or significant lesions EYES: normal, Conjunctiva are pink and non-injected, sclera clear OROPHARYNX:no exudate, no erythema and lips, buccal mucosa, and tongue normal  NECK: supple, thyroid normal size, non-tender, without nodularity.  The prominent fat pad on both sides of the neck is consistent with buffalo hump appearance LYMPH:  no palpable lymphadenopathy in the cervical, axillary or inguinal LUNGS: clear to auscultation and percussion with normal breathing effort HEART: regular rate & rhythm and no murmurs and no lower extremity edema ABDOMEN:abdomen soft, non-tender and normal bowel sounds Musculoskeletal:no cyanosis of digits and no clubbing  NEURO: alert & oriented x 3 with fluent speech, no focal motor/sensory deficits  LABORATORY  DATA:  I have reviewed the data as listed    Component Value Date/Time   NA 142 03/23/2020 0950   NA 141 08/14/2017 0931   K 4.3 03/23/2020 0950   K 4.3 08/14/2017 0931   CL 108 03/23/2020 0950   CO2 27 03/23/2020 0950   CO2 26 08/14/2017 0931   GLUCOSE 107 (H) 03/23/2020 0950   GLUCOSE 100 08/14/2017 0931   BUN 12 03/23/2020 0950   BUN 13.0 08/14/2017 0931   CREATININE 0.75 03/23/2020 0950   CREATININE 0.8 08/14/2017 0931   CALCIUM 9.5 03/23/2020 0950   CALCIUM 10.1 08/14/2017 0931   PROT 6.8 03/23/2020 0950   PROT 7.6 08/14/2017 0931   ALBUMIN 3.7 03/23/2020 0950   ALBUMIN 4.0 08/14/2017 0931   AST 26 03/23/2020 0950   AST 24 08/14/2017 0931   ALT 42 03/23/2020 0950   ALT 29 08/14/2017 0931   ALKPHOS 45 03/23/2020  0950   ALKPHOS 49 08/14/2017 0931   BILITOT 0.3 03/23/2020 0950   BILITOT 0.38 08/14/2017 0931   GFRNONAA >60 03/23/2020 0950   GFRAA >60 03/23/2020 0950    No results found for: SPEP, UPEP  Lab Results  Component Value Date   WBC 5.7 03/23/2020   NEUTROABS 3.5 03/23/2020   HGB 14.1 03/23/2020   HCT 42.1 03/23/2020   MCV 98.4 03/23/2020   PLT 262 03/23/2020      Chemistry      Component Value Date/Time   NA 142 03/23/2020 0950   NA 141 08/14/2017 0931   K 4.3 03/23/2020 0950   K 4.3 08/14/2017 0931   CL 108 03/23/2020 0950   CO2 27 03/23/2020 0950   CO2 26 08/14/2017 0931   BUN 12 03/23/2020 0950   BUN 13.0 08/14/2017 0931   CREATININE 0.75 03/23/2020 0950   CREATININE 0.8 08/14/2017 0931      Component Value Date/Time   CALCIUM 9.5 03/23/2020 0950   CALCIUM 10.1 08/14/2017 0931   ALKPHOS 45 03/23/2020 0950   ALKPHOS 49 08/14/2017 0931   AST 26 03/23/2020 0950   AST 24 08/14/2017 0931   ALT 42 03/23/2020 0950   ALT 29 08/14/2017 0931   BILITOT 0.3 03/23/2020 0950   BILITOT 0.38 08/14/2017 0931

## 2020-03-23 NOTE — Assessment & Plan Note (Signed)
She complained of fatigue Thyroid function test is within normal limits

## 2020-03-24 ENCOUNTER — Encounter: Payer: Self-pay | Admitting: Hematology and Oncology

## 2020-03-24 LAB — TSH: TSH: 0.922 u[IU]/mL (ref 0.308–3.960)

## 2020-03-24 LAB — LACTATE DEHYDROGENASE: LDH: 205 U/L — ABNORMAL HIGH (ref 98–192)

## 2020-04-01 ENCOUNTER — Ambulatory Visit: Payer: BC Managed Care – PPO

## 2020-04-17 ENCOUNTER — Ambulatory Visit: Payer: BC Managed Care – PPO | Attending: Internal Medicine

## 2020-04-17 DIAGNOSIS — Z23 Encounter for immunization: Secondary | ICD-10-CM

## 2020-04-17 NOTE — Progress Notes (Signed)
   Covid-19 Vaccination Clinic  Name:  Alexandra Allen    MRN: [id number] DOB: 08-Jan-1961  04/17/2020  [name] was observed post Covid-19 immunization for 15 minutes without incident. She was provided with Vaccine Information Sheet and instruction to access the V-Safe system.   [name] was instructed to call 911 with any severe reactions post vaccine: Marland Kitchen Difficulty breathing  . Swelling of face and throat  . A fast heartbeat  . A bad rash all over body  . Dizziness and weakness   Immunizations Administered    Name Date Dose VIS Date Route   Pfizer COVID-19 Vaccine 04/17/2020  1:07 PM 0.3 mL 12/22/2018 Intramuscular   Manufacturer: [place]   Lot: [id number]   [place]: [id number]

## 2020-04-19 ENCOUNTER — Other Ambulatory Visit: Payer: Self-pay | Admitting: Endocrinology

## 2020-04-19 DIAGNOSIS — D3500 Benign neoplasm of unspecified adrenal gland: Secondary | ICD-10-CM

## 2020-04-19 DIAGNOSIS — E249 Cushing's syndrome, unspecified: Secondary | ICD-10-CM

## 2020-04-19 DIAGNOSIS — Z79899 Other long term (current) drug therapy: Secondary | ICD-10-CM

## 2020-05-05 ENCOUNTER — Ambulatory Visit
Admission: RE | Admit: 2020-05-05 | Discharge: 2020-05-05 | Disposition: A | Discharge status: Home / Self Care | Payer: BC Managed Care – PPO | Source: Ambulatory Visit | Attending: Endocrinology | Admitting: Endocrinology

## 2020-05-05 DIAGNOSIS — E249 Cushing's syndrome, unspecified: Secondary | ICD-10-CM

## 2020-05-05 DIAGNOSIS — Z79899 Other long term (current) drug therapy: Secondary | ICD-10-CM

## 2020-05-05 DIAGNOSIS — E278 Other specified disorders of adrenal gland: Secondary | ICD-10-CM | POA: Diagnosis not present

## 2020-05-05 DIAGNOSIS — D3500 Benign neoplasm of unspecified adrenal gland: Secondary | ICD-10-CM

## 2020-05-05 MED ORDER — IOPAMIDOL (ISOVUE-300) INJECTION 61%
100.0000 mL | Freq: Once | INTRAVENOUS | Status: AC | PRN
  Administered 2020-05-05: 100 mL via INTRAVENOUS

## 2020-05-08 ENCOUNTER — Ambulatory Visit: Payer: BC Managed Care – PPO | Attending: Family Medicine

## 2020-05-08 DIAGNOSIS — Z23 Encounter for immunization: Secondary | ICD-10-CM

## 2020-05-08 NOTE — Progress Notes (Signed)
   Covid-19 Vaccination Clinic  Name:  Brityn Mastrogiovanni    MRN: [id number] DOB: 10/30/1960  05/08/2020  Ms. Bonilla was observed post Covid-19 immunization for 15 minutes without incident. She was provided with Vaccine Information Sheet and instruction to access the V-Safe system.   Ms. Satcher was instructed to call 911 with any severe reactions post vaccine: Marland Kitchen Difficulty breathing  . Swelling of face and throat  . A fast heartbeat  . A bad rash all over body  . Dizziness and weakness   Immunizations Administered    Name Date Dose VIS Date Route   Pfizer COVID-19 Vaccine 05/08/2020  2:04 PM 0.3 mL 12/22/2018 Intramuscular   Manufacturer: [place]   Lot: [id number]   [place]: [id number]

## 2020-05-11 DIAGNOSIS — E249 Cushing's syndrome, unspecified: Secondary | ICD-10-CM | POA: Diagnosis not present

## 2020-05-11 DIAGNOSIS — D3500 Benign neoplasm of unspecified adrenal gland: Secondary | ICD-10-CM | POA: Diagnosis not present

## 2020-05-29 DIAGNOSIS — J0100 Acute maxillary sinusitis, unspecified: Secondary | ICD-10-CM | POA: Diagnosis not present

## 2020-06-06 DIAGNOSIS — Z20822 Contact with and (suspected) exposure to covid-19: Secondary | ICD-10-CM | POA: Diagnosis not present

## 2020-06-06 DIAGNOSIS — Z03818 Encounter for observation for suspected exposure to other biological agents ruled out: Secondary | ICD-10-CM | POA: Diagnosis not present

## 2020-06-19 DIAGNOSIS — C8590 Non-Hodgkin lymphoma, unspecified, unspecified site: Secondary | ICD-10-CM | POA: Diagnosis not present

## 2020-06-19 DIAGNOSIS — M545 Low back pain: Secondary | ICD-10-CM | POA: Diagnosis not present

## 2020-06-19 DIAGNOSIS — M150 Primary generalized (osteo)arthritis: Secondary | ICD-10-CM | POA: Diagnosis not present

## 2020-06-19 DIAGNOSIS — G8929 Other chronic pain: Secondary | ICD-10-CM | POA: Diagnosis not present

## 2020-06-19 DIAGNOSIS — E240 Pituitary-dependent Cushing's disease: Secondary | ICD-10-CM | POA: Diagnosis not present

## 2020-06-27 DIAGNOSIS — E249 Cushing's syndrome, unspecified: Secondary | ICD-10-CM | POA: Diagnosis not present

## 2020-08-18 ENCOUNTER — Other Ambulatory Visit: Payer: Self-pay | Admitting: Family Medicine

## 2020-08-18 DIAGNOSIS — Z1231 Encounter for screening mammogram for malignant neoplasm of breast: Secondary | ICD-10-CM

## 2020-08-25 DIAGNOSIS — L814 Other melanin hyperpigmentation: Secondary | ICD-10-CM | POA: Diagnosis not present

## 2020-08-25 DIAGNOSIS — L820 Inflamed seborrheic keratosis: Secondary | ICD-10-CM | POA: Diagnosis not present

## 2020-08-25 DIAGNOSIS — L905 Scar conditions and fibrosis of skin: Secondary | ICD-10-CM | POA: Diagnosis not present

## 2020-08-25 DIAGNOSIS — L578 Other skin changes due to chronic exposure to nonionizing radiation: Secondary | ICD-10-CM | POA: Diagnosis not present

## 2020-08-25 DIAGNOSIS — Z85828 Personal history of other malignant neoplasm of skin: Secondary | ICD-10-CM | POA: Diagnosis not present

## 2020-09-20 ENCOUNTER — Ambulatory Visit
Admission: RE | Admit: 2020-09-20 | Discharge: 2020-09-20 | Disposition: A | Discharge status: Home / Self Care | Payer: BC Managed Care – PPO | Source: Ambulatory Visit | Attending: Family Medicine | Admitting: Family Medicine

## 2020-09-20 ENCOUNTER — Other Ambulatory Visit: Payer: Self-pay

## 2020-09-20 DIAGNOSIS — Z1231 Encounter for screening mammogram for malignant neoplasm of breast: Secondary | ICD-10-CM | POA: Diagnosis not present

## 2020-09-25 DIAGNOSIS — C8330 Diffuse large B-cell lymphoma, unspecified site: Secondary | ICD-10-CM | POA: Diagnosis not present

## 2020-09-25 DIAGNOSIS — Z0000 Encounter for general adult medical examination without abnormal findings: Secondary | ICD-10-CM | POA: Diagnosis not present

## 2020-09-25 DIAGNOSIS — R871 Abnormal level of hormones in specimens from female genital organs: Secondary | ICD-10-CM | POA: Diagnosis not present

## 2020-09-25 DIAGNOSIS — Z5181 Encounter for therapeutic drug level monitoring: Secondary | ICD-10-CM | POA: Diagnosis not present

## 2020-09-25 DIAGNOSIS — E782 Mixed hyperlipidemia: Secondary | ICD-10-CM | POA: Diagnosis not present

## 2020-10-12 DIAGNOSIS — E119 Type 2 diabetes mellitus without complications: Secondary | ICD-10-CM | POA: Diagnosis not present

## 2020-10-12 DIAGNOSIS — D3500 Benign neoplasm of unspecified adrenal gland: Secondary | ICD-10-CM | POA: Diagnosis not present

## 2020-10-12 DIAGNOSIS — Z79899 Other long term (current) drug therapy: Secondary | ICD-10-CM | POA: Diagnosis not present

## 2020-10-12 DIAGNOSIS — E559 Vitamin D deficiency, unspecified: Secondary | ICD-10-CM | POA: Diagnosis not present

## 2020-10-12 DIAGNOSIS — Z9189 Other specified personal risk factors, not elsewhere classified: Secondary | ICD-10-CM | POA: Diagnosis not present

## 2020-10-12 DIAGNOSIS — E249 Cushing's syndrome, unspecified: Secondary | ICD-10-CM | POA: Diagnosis not present

## 2020-10-12 DIAGNOSIS — L689 Hypertrichosis, unspecified: Secondary | ICD-10-CM | POA: Diagnosis not present

## 2020-10-17 ENCOUNTER — Other Ambulatory Visit: Payer: Self-pay | Admitting: Family Medicine

## 2020-10-17 DIAGNOSIS — E2839 Other primary ovarian failure: Secondary | ICD-10-CM

## 2020-10-24 DIAGNOSIS — D3500 Benign neoplasm of unspecified adrenal gland: Secondary | ICD-10-CM | POA: Diagnosis not present

## 2020-10-24 DIAGNOSIS — E559 Vitamin D deficiency, unspecified: Secondary | ICD-10-CM | POA: Diagnosis not present

## 2020-10-24 DIAGNOSIS — E119 Type 2 diabetes mellitus without complications: Secondary | ICD-10-CM | POA: Diagnosis not present

## 2020-10-24 DIAGNOSIS — L689 Hypertrichosis, unspecified: Secondary | ICD-10-CM | POA: Diagnosis not present

## 2020-10-24 DIAGNOSIS — E249 Cushing's syndrome, unspecified: Secondary | ICD-10-CM | POA: Diagnosis not present

## 2020-10-24 DIAGNOSIS — Z79899 Other long term (current) drug therapy: Secondary | ICD-10-CM | POA: Diagnosis not present

## 2020-11-16 DIAGNOSIS — Z1211 Encounter for screening for malignant neoplasm of colon: Secondary | ICD-10-CM | POA: Diagnosis not present

## 2020-12-25 DIAGNOSIS — G8929 Other chronic pain: Secondary | ICD-10-CM | POA: Diagnosis not present

## 2020-12-25 DIAGNOSIS — M150 Primary generalized (osteo)arthritis: Secondary | ICD-10-CM | POA: Diagnosis not present

## 2020-12-25 DIAGNOSIS — C8590 Non-Hodgkin lymphoma, unspecified, unspecified site: Secondary | ICD-10-CM | POA: Diagnosis not present

## 2020-12-25 DIAGNOSIS — E240 Pituitary-dependent Cushing's disease: Secondary | ICD-10-CM | POA: Diagnosis not present

## 2021-02-28 ENCOUNTER — Ambulatory Visit (HOSPITAL_BASED_OUTPATIENT_CLINIC_OR_DEPARTMENT_OTHER): Payer: BC Managed Care – PPO

## 2021-03-14 ENCOUNTER — Other Ambulatory Visit: Payer: Self-pay

## 2021-03-14 ENCOUNTER — Ambulatory Visit (HOSPITAL_BASED_OUTPATIENT_CLINIC_OR_DEPARTMENT_OTHER)
Admission: RE | Admit: 2021-03-14 | Discharge: 2021-03-14 | Disposition: A | Discharge status: Home / Self Care | Payer: BC Managed Care – PPO | Source: Ambulatory Visit | Attending: Family Medicine | Admitting: Family Medicine

## 2021-03-14 DIAGNOSIS — E2839 Other primary ovarian failure: Secondary | ICD-10-CM | POA: Insufficient documentation

## 2021-03-14 DIAGNOSIS — Z780 Asymptomatic menopausal state: Secondary | ICD-10-CM | POA: Diagnosis not present

## 2021-03-20 ENCOUNTER — Telehealth: Payer: Self-pay

## 2021-03-20 DIAGNOSIS — E049 Nontoxic goiter, unspecified: Secondary | ICD-10-CM | POA: Diagnosis not present

## 2021-03-20 DIAGNOSIS — D3500 Benign neoplasm of unspecified adrenal gland: Secondary | ICD-10-CM | POA: Diagnosis not present

## 2021-03-20 DIAGNOSIS — E1169 Type 2 diabetes mellitus with other specified complication: Secondary | ICD-10-CM | POA: Diagnosis not present

## 2021-03-20 NOTE — Telephone Encounter (Signed)
Called and left a message asking her to call the office back. Asking if she can come in any earlier on 5/26.

## 2021-03-20 NOTE — Telephone Encounter (Signed)
She called back. She is unable to come in any earlier to appt. Appt left as scheduled.

## 2021-03-22 ENCOUNTER — Inpatient Hospital Stay: Payer: BC Managed Care – PPO | Attending: Hematology and Oncology | Admitting: Hematology and Oncology

## 2021-03-22 ENCOUNTER — Encounter: Payer: Self-pay | Admitting: Hematology and Oncology

## 2021-03-22 ENCOUNTER — Telehealth: Payer: Self-pay | Admitting: Hematology and Oncology

## 2021-03-22 ENCOUNTER — Other Ambulatory Visit: Payer: Self-pay

## 2021-03-22 DIAGNOSIS — R7303 Prediabetes: Secondary | ICD-10-CM

## 2021-03-22 DIAGNOSIS — Z8572 Personal history of non-Hodgkin lymphomas: Secondary | ICD-10-CM | POA: Diagnosis not present

## 2021-03-22 DIAGNOSIS — F17210 Nicotine dependence, cigarettes, uncomplicated: Secondary | ICD-10-CM | POA: Insufficient documentation

## 2021-03-22 DIAGNOSIS — Z9221 Personal history of antineoplastic chemotherapy: Secondary | ICD-10-CM | POA: Diagnosis not present

## 2021-03-22 DIAGNOSIS — Z720 Tobacco use: Secondary | ICD-10-CM | POA: Diagnosis not present

## 2021-03-22 DIAGNOSIS — Z923 Personal history of irradiation: Secondary | ICD-10-CM | POA: Insufficient documentation

## 2021-03-22 NOTE — Telephone Encounter (Signed)
Scheduled appointment per 05/26 scheduled message. Patient is aware.

## 2021-03-22 NOTE — Assessment & Plan Note (Signed)
Clinically, she have no signs of cancer recurrence We discussed the importance of nicotine cessation I will see her again in 1 year with history, physical examination and blood work

## 2021-03-22 NOTE — Assessment & Plan Note (Signed)
I spent some time counseling the patient the importance of tobacco cessation. She is currently interested to quit on her own

## 2021-03-22 NOTE — Assessment & Plan Note (Signed)
I reviewed her outside test results She has signs of diabetes She managed to lose some weight recently We discussed the importance of dietary modification and weight loss to reduce risk of lymphoma recurrence

## 2021-03-22 NOTE — Progress Notes (Signed)
[place] OFFICE PROGRESS NOTE  Patient Care Team: Maurice Small, MD as PCP - General (Family Medicine) Stark Klein, MD as Consulting Physician (General Surgery) Izora Gala, MD as Consulting Physician (Otolaryngology)  ASSESSMENT & PLAN:  History of B-cell lymphoma Clinically, she have no signs of cancer recurrence We discussed the importance of nicotine cessation I will see her again in 1 year with history, physical examination and blood work  Tobacco abuse I spent some time counseling the patient the importance of tobacco cessation. She is currently interested to quit on her own   Prediabetes I reviewed her outside test results She has signs of diabetes She managed to lose some weight recently We discussed the importance of dietary modification and weight loss to reduce risk of lymphoma recurrence   No orders of the defined types were placed in this encounter.   All questions were answered. The patient knows to call the clinic with any problems, questions or concerns. The total time spent in the appointment was 20 minutes encounter with patients including review of chart and various tests results, discussions about plan of care and coordination of care plan   Alexandra Lark, MD 03/22/2021 10:43 AM  INTERVAL HISTORY: Please see below for problem oriented charting. She returns for further follow-up She is still smoking approximately half a pack of cigarettes per day No recent infection, fever or chills No new lymphadenopathy We reviewed some of her test results  SUMMARY OF ONCOLOGIC HISTORY: Oncology History Overview Note  Diffuse large B cell lymphoma   Staging form: Lymphoid Neoplasms, AJCC 6th Edition     Clinical stage from 12/01/2014: Stage I - Signed by Alexandra Lark, MD on 12/01/2014      History of B-cell lymphoma  09/08/2014 Imaging   Ultrasound pelvis detected abnormal soft tissue mass in the left groin   09/13/2014 Imaging   Ultrasound abdomen  showed up to 7 cm mass-like area of mild hypoechogenicity in the left hepatic lobe   09/21/2014 Imaging   MRI of the abdomen showed 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   11/23/2014 Bone Marrow Biopsy   Accession: [id number] is negative for lymphoma   11/30/2014 - 01/11/2015 Chemotherapy   She received chemotherapy with RCHOP X 3 cycles   11/30/2014 Adverse Reaction   Treatment was complicated by mild hypersensitivity reaction to rituximab   01/30/2015 Imaging   Repeat PET CT scan showed complete response to the lymphadenopathy.   02/23/2015 - 03/22/2015 Radiation Therapy   She received radiation therapy 36 Gy in 20 fractions to left groin area   08/01/2015 Imaging   PET scan showed complete response to Rx   08/13/2016 Imaging   Ct scan showed no adenopathy identified within the abdomen or pelvis.   08/20/2017 Imaging   1. No findings of active lymphoma. 2. Hepatic hemangiomas and bilateral adrenal adenomas. 3. Right posterior thyroid lobe nodule was not previously hypermetabolic and accordingly is probably benign. 4. Calcified granuloma in the left upper lobe. No worrisome pulmonary nodule. 5.  Aortic Atherosclerosis (ICD10-I70.0). 6. Mild lumbar scoliosis and degenerative disc disease. 7. Diffuse hepatic steatosis.     REVIEW OF SYSTEMS:   Constitutional:  Denies fevers, chills or abnormal weight loss Eyes: Denies blurriness of vision Ears, nose, mouth, throat, and face: Denies mucositis or sore throat Respiratory: Denies cough, dyspnea or wheezes Cardiovascular: Denies palpitation, chest discomfort or lower extremity  swelling Gastrointestinal:  Denies nausea, heartburn or change in bowel habits Skin: Denies abnormal skin rashes Lymphatics: Denies new lymphadenopathy or easy bruising Neurological:Denies numbness, tingling or new weaknesses Behavioral/Psych: Mood is stable, no new changes  All other systems were reviewed with the patient and are negative.  I have reviewed the past medical history, past surgical history, social history and family history with the patient and they are unchanged from previous note.  ALLERGIES:  is allergic to adhesive [tape], influenza vaccines, prednisone, and sulfa antibiotics.  MEDICATIONS:  Current Outpatient Medications  Medication Sig Dispense Refill  . ibuprofen (ADVIL) 200 MG tablet Take 200 mg by mouth every 6 (six) hours as needed.    . Multiple Vitamins-Minerals (MULTIVITAMIN PO) Take by mouth daily.    . Omega-3 Fatty Acids (FISH OIL PO) Take by mouth.    . Probiotic Product (PROBIOTIC DAILY PO) Take by mouth.    . Turmeric, Curcuma Longa, (CURCUMIN) POWD by Does not apply route daily.     No current facility-administered medications for this visit.    PHYSICAL EXAMINATION: ECOG PERFORMANCE STATUS: 0 - Asymptomatic  Vitals:   03/22/21 1009  BP: (!) 141/84  Pulse: 67  Resp: 18  Temp: 97.9 F (36.6 C)  SpO2: 98%   Filed Weights   03/22/21 1009  Weight: 169 lb 6.4 oz (76.8 kg)    GENERAL:alert, no distress and comfortable SKIN: skin color, texture, turgor are normal, no rashes or significant lesions EYES: normal, Conjunctiva are pink and non-injected, sclera clear OROPHARYNX:no exudate, no erythema and lips, buccal mucosa, and tongue normal  NECK: supple, thyroid normal size, non-tender, without nodularity LYMPH:  no palpable lymphadenopathy in the cervical, axillary or inguinal LUNGS: clear to auscultation and percussion with normal breathing effort HEART: regular rate & rhythm and no murmurs and no lower extremity edema ABDOMEN:abdomen soft,  non-tender and normal bowel sounds Musculoskeletal:no cyanosis of digits and no clubbing  NEURO: alert & oriented x 3 with fluent speech, no focal motor/sensory deficits  LABORATORY DATA:  I have reviewed the data as listed    Component Value Date/Time   NA 142 03/23/2020 0950   NA 141 08/14/2017 0931   K 4.3 03/23/2020 0950   K 4.3 08/14/2017 0931   CL 108 03/23/2020 0950   CO2 27 03/23/2020 0950   CO2 26 08/14/2017 0931   GLUCOSE 107 (H) 03/23/2020 0950   GLUCOSE 100 08/14/2017 0931   BUN 12 03/23/2020 0950   BUN 13.0 08/14/2017 0931   CREATININE 0.75 03/23/2020 0950   CREATININE 0.8 08/14/2017 0931   CALCIUM 9.5 03/23/2020 0950   CALCIUM 10.1 08/14/2017 0931   PROT 6.8 03/23/2020 0950   PROT 7.6 08/14/2017 0931   ALBUMIN 3.7 03/23/2020 0950   ALBUMIN 4.0 08/14/2017 0931   AST 26 03/23/2020 0950   AST 24 08/14/2017 0931   ALT 42 03/23/2020 0950   ALT 29 08/14/2017 0931   ALKPHOS 45 03/23/2020 0950   ALKPHOS 49 08/14/2017 0931   BILITOT 0.3 03/23/2020 0950   BILITOT 0.38 08/14/2017 0931   GFRNONAA >60 03/23/2020 0950   GFRAA >60 03/23/2020 0950    No results found for: SPEP, UPEP  Lab Results  Component Value Date   WBC 5.7 03/23/2020   NEUTROABS 3.5 03/23/2020  HGB 14.1 03/23/2020   HCT 42.1 03/23/2020   MCV 98.4 03/23/2020   PLT 262 03/23/2020      Chemistry      Component Value Date/Time   NA 142 03/23/2020 0950   NA 141 08/14/2017 0931   K 4.3 03/23/2020 0950   K 4.3 08/14/2017 0931   CL 108 03/23/2020 0950   CO2 27 03/23/2020 0950   CO2 26 08/14/2017 0931   BUN 12 03/23/2020 0950   BUN 13.0 08/14/2017 0931   CREATININE 0.75 03/23/2020 0950   CREATININE 0.8 08/14/2017 0931      Component Value Date/Time   CALCIUM 9.5 03/23/2020 0950   CALCIUM 10.1 08/14/2017 0931   ALKPHOS 45 03/23/2020 0950   ALKPHOS 49 08/14/2017 0931   AST 26 03/23/2020 0950   AST 24 08/14/2017 0931   ALT 42 03/23/2020 0950   ALT 29 08/14/2017 0931   BILITOT 0.3  03/23/2020 0950   BILITOT 0.38 08/14/2017 0931       RADIOGRAPHIC STUDIES: I have personally reviewed the radiological images as listed and agreed with the findings in the report. DG BONE DENSITY (DXA)  Result Date: 03/14/2021 EXAM: DUAL X-RAY ABSORPTIOMETRY (DXA) FOR BONE MINERAL DENSITY IMPRESSION: Referring Physician:  Maurice Small Your patient completed a bone mineral density test using GE Lunar iDXA system (analysis version: 16). Technologist: ALW PATIENT: Name:Alexandra Allen, Alexandra Allen Patient OF:[id number] Birth Date:29-Aug-1962Height:63.0 in. DPO:[id number]:00/86/7619JKDTOI:712.4 lbs. Indications:Caucasian, Estrogen Deficency, Hysterectomy, Post Menopausal, Tobacco User (Current Smoker)Fractures: Treatments:Multivitamin ASSESSMENT: The BMD measured at DualFemur Neck Left is 1.099 g/cm2 with a T-score of 0.4. This patient is considered normal according to [facility]) criteria. The scan quality is good. L-2 and L-3 were excluded due to degenerative change. SiteRegionMeasured DateMeasured AgeYABMDSignificant CHANGE T-score DualFemurNeck Left05/18/202258.20.41.099 g/cm2 AP SpineL1-L4 (L2,L3)05/18/202258.20.91.295 g/cm2 DualFemurTotal Mean05/18/202258.21.01.131 g/cm2 World Health Organization [facility]) criteria for post-menopausal, Caucasian Women: NormalT-score at or above -1 SD OsteopeniaT-score between -1 and -2.5 SD OsteoporosisT-score at or below -2.5 SD RECOMMENDATION: 1. All patients should optimize calcium and vitamin D inake. 2. Consider FDA approved medical therapies in postmenopausal women and men aged 45 years and older, based on the following: a. A hip or vetebral (clinical or morphometric) fracture b. T-score = -2.5 at the femoral neck or spine after appropriate evaluation to exclude secondary causes c. Low bone mass (T-score between -1.0 and -2.5 at the femoral neck or spine) and a 10- year probability of a hip fracure = 3% or a 10 year probabilty of a major osteoporosis-related  fracture = 20% based on the [place]-adapted WHO algorithm. 3. Clinician judgement and/or patient preference may indicate treatment fo people with10-year fracture probabilities above or below these levels. FOLLOW-UP: Patients with diagnosis of osteoporosis or at high risk for fracture should have regular bone mineral density tests.For patients eligible for medicare routine testing is allowed once every 2 years. The testing frequency can be increased to one year for patients who have rapidly progressing disease, those who are receiving or discontinuing medical therapy to restore bone mass, or have additional risk factors. I have reviewed this study and agree with the findings. [facility] Electronically Signed   By: Lowella Grip III M.D.   On: 03/14/2021 16:32

## 2021-03-27 DIAGNOSIS — E249 Cushing's syndrome, unspecified: Secondary | ICD-10-CM | POA: Diagnosis not present

## 2021-03-27 DIAGNOSIS — E1169 Type 2 diabetes mellitus with other specified complication: Secondary | ICD-10-CM | POA: Diagnosis not present

## 2021-03-27 DIAGNOSIS — D3500 Benign neoplasm of unspecified adrenal gland: Secondary | ICD-10-CM | POA: Diagnosis not present

## 2021-03-27 DIAGNOSIS — E049 Nontoxic goiter, unspecified: Secondary | ICD-10-CM | POA: Diagnosis not present

## 2021-06-05 ENCOUNTER — Other Ambulatory Visit: Payer: Self-pay | Admitting: Endocrinology

## 2021-06-05 DIAGNOSIS — D3500 Benign neoplasm of unspecified adrenal gland: Secondary | ICD-10-CM

## 2021-06-05 DIAGNOSIS — E249 Cushing's syndrome, unspecified: Secondary | ICD-10-CM

## 2021-06-25 DIAGNOSIS — R053 Chronic cough: Secondary | ICD-10-CM | POA: Diagnosis not present

## 2021-06-25 DIAGNOSIS — M150 Primary generalized (osteo)arthritis: Secondary | ICD-10-CM | POA: Diagnosis not present

## 2021-06-25 DIAGNOSIS — C8590 Non-Hodgkin lymphoma, unspecified, unspecified site: Secondary | ICD-10-CM | POA: Diagnosis not present

## 2021-06-25 DIAGNOSIS — E240 Pituitary-dependent Cushing's disease: Secondary | ICD-10-CM | POA: Diagnosis not present

## 2021-06-25 DIAGNOSIS — G8929 Other chronic pain: Secondary | ICD-10-CM | POA: Diagnosis not present

## 2021-08-28 HISTORY — PX: ADRENAL GLAND SURGERY: SHX544

## 2021-09-05 IMAGING — MG DIGITAL SCREENING BILAT W/ TOMO W/ CAD
6 of 10 series · 6 of 30 positions shown · non-contrast
Comparison: Previous exam(s).

CLINICAL DATA: Screening.

EXAM:
DIGITAL SCREENING BILATERAL MAMMOGRAM WITH TOMO AND CAD

[R CC synth-2D]
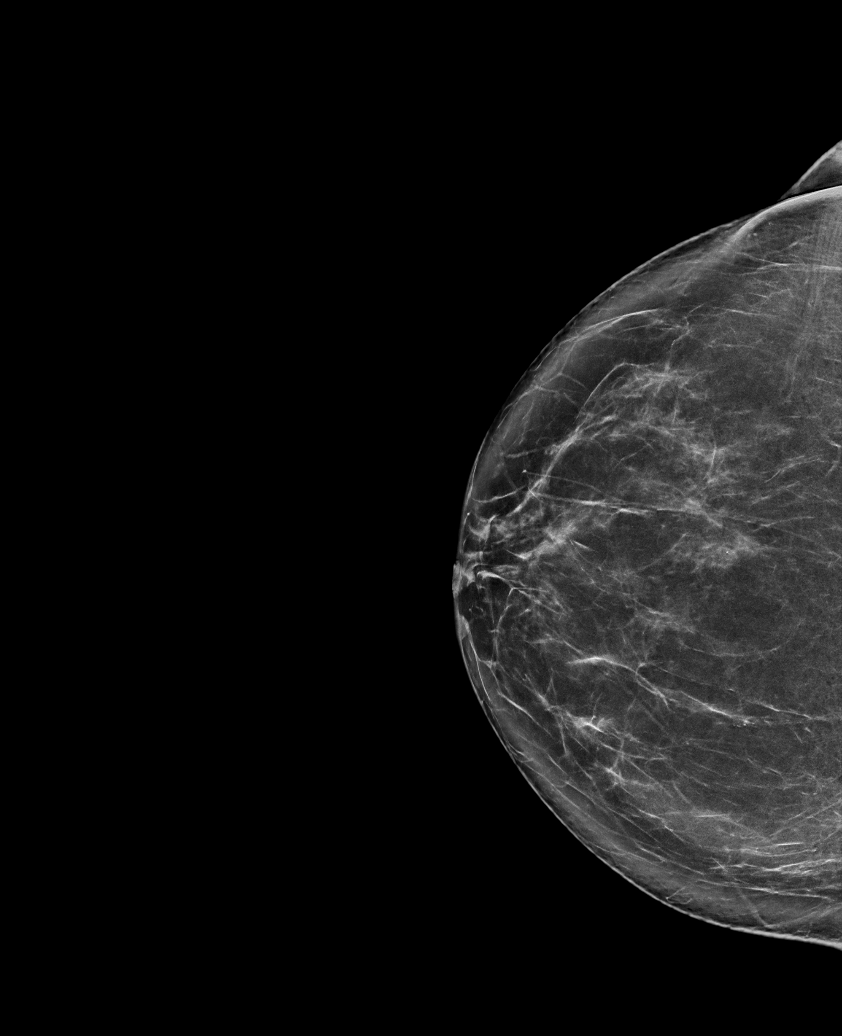

[L MLO synth-2D (1 of 2)]
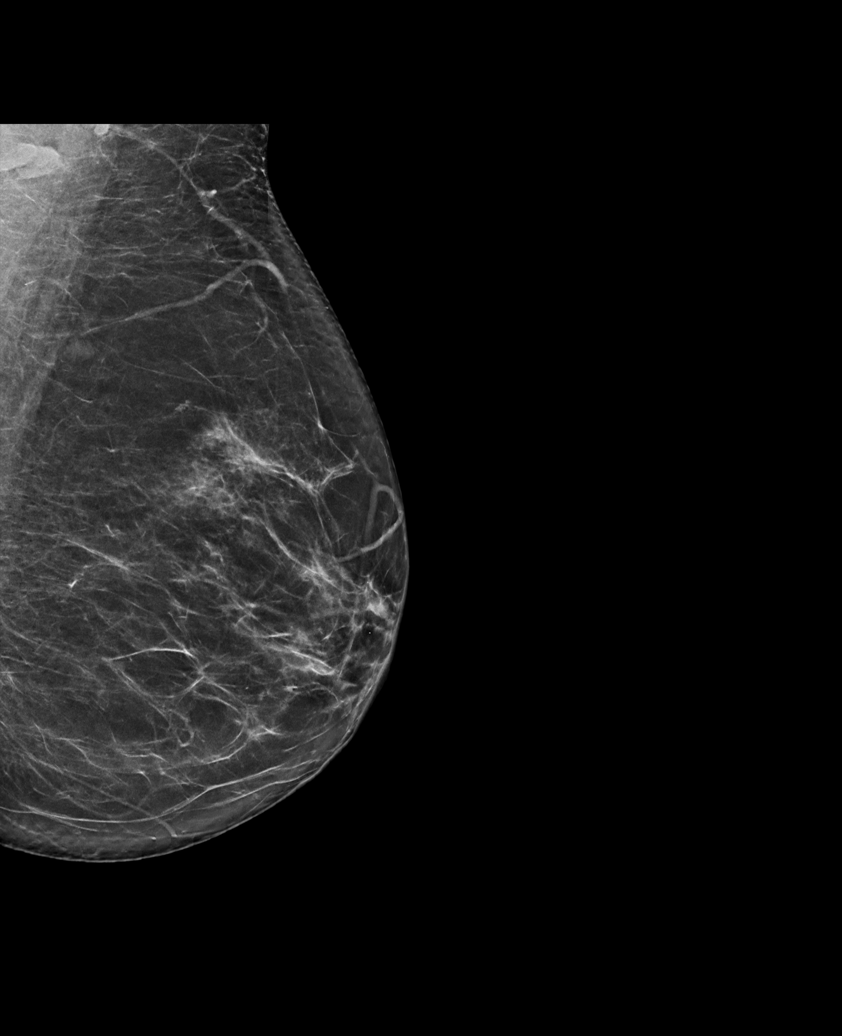

[R MLO synth-2D]
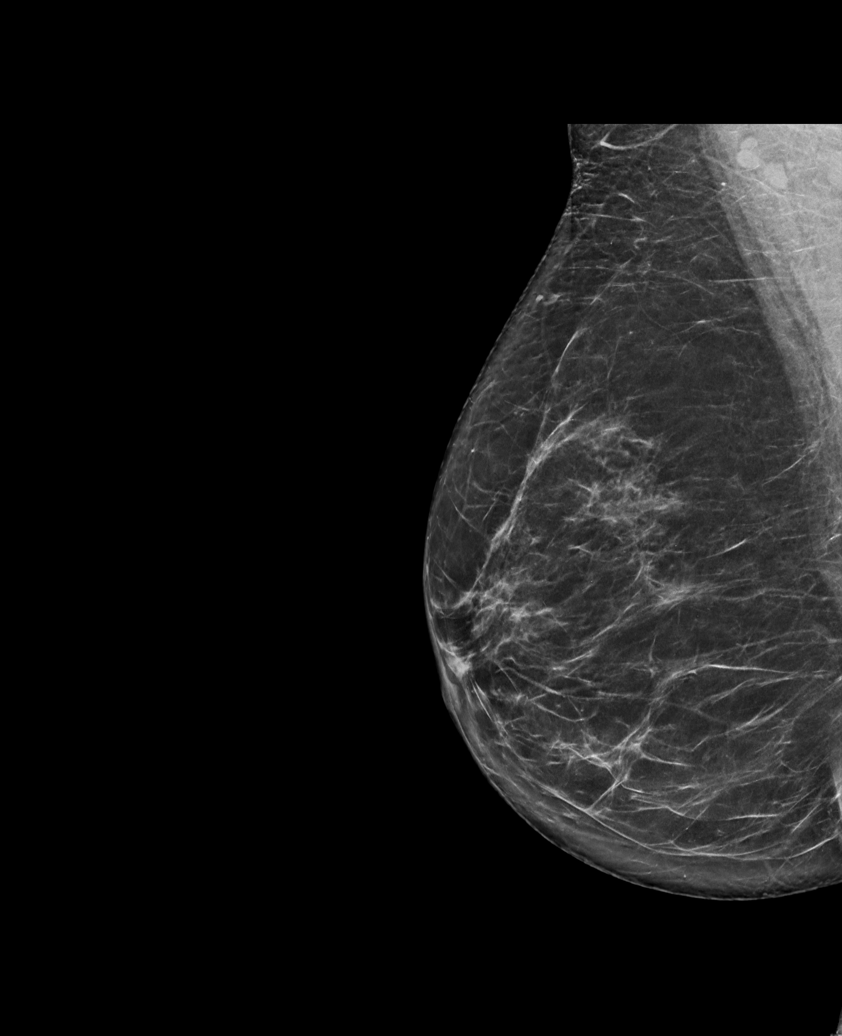

[L MLO synth-2D (2 of 2)]
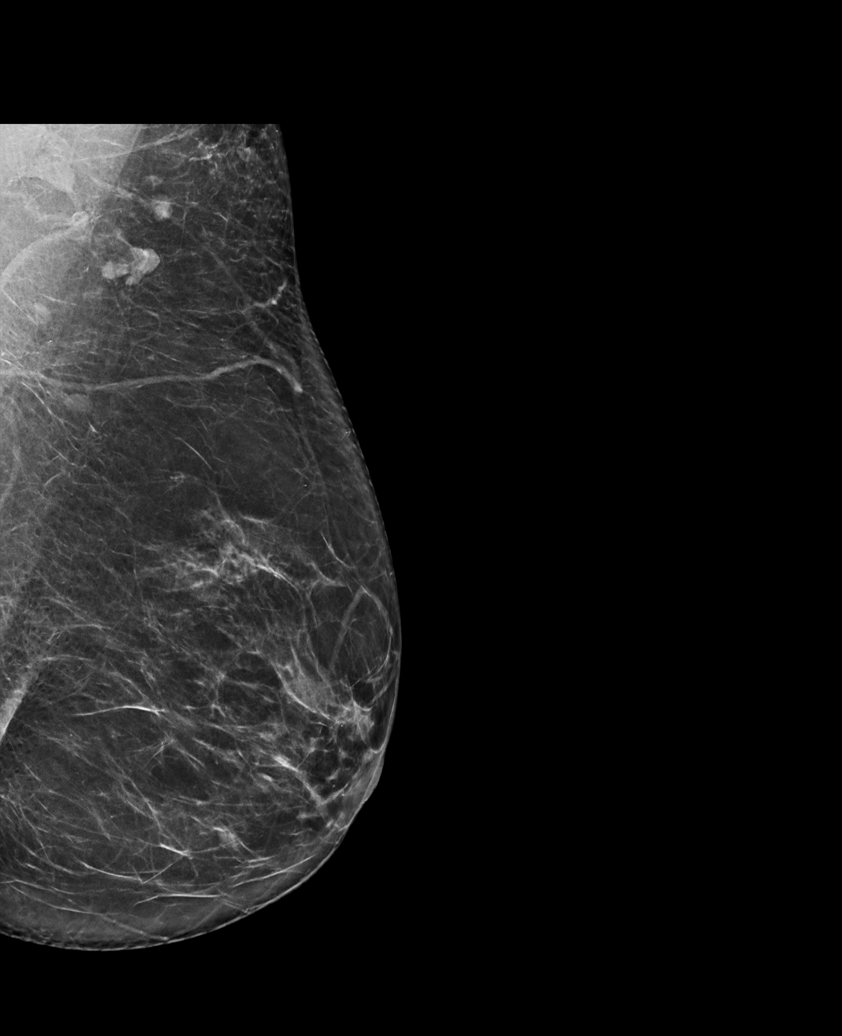

[L CC synth-2D]
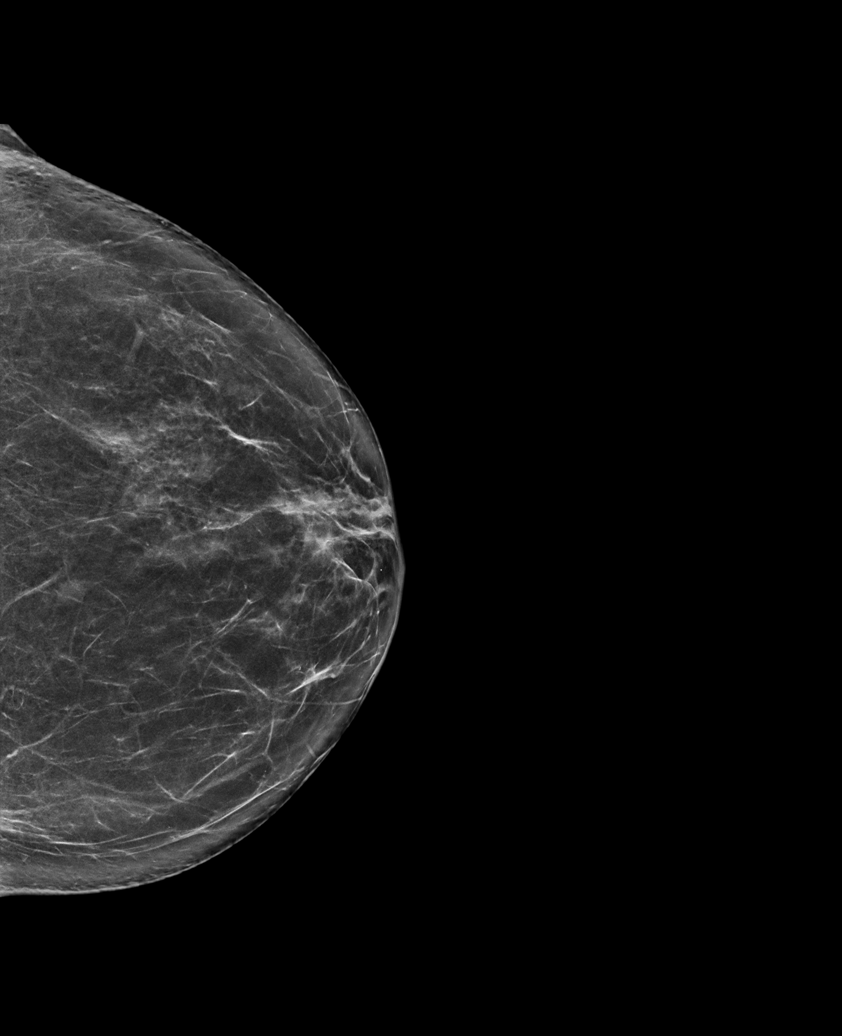

[R CC tomo · tomo slice 41/81.0]
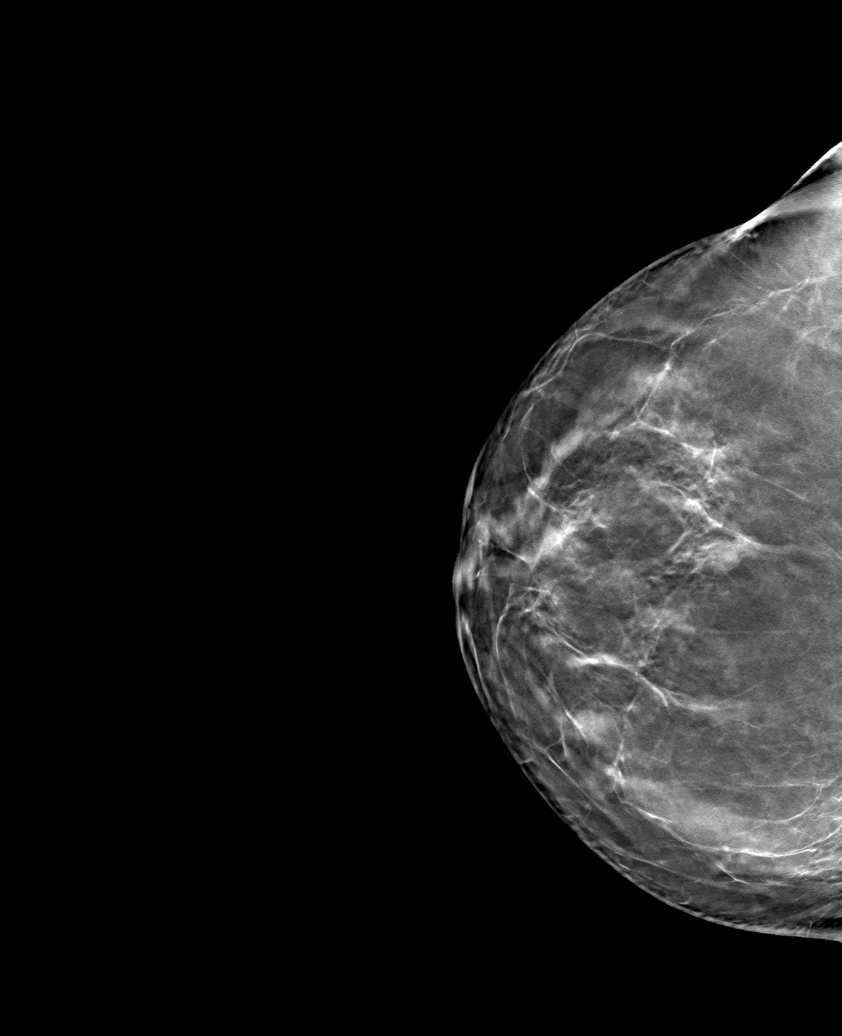

[6 of 30 positions shown; findings below may reference images not displayed]

ACR Breast Density Category b: There are scattered areas of
fibroglandular density.
FINDINGS: There are no findings suspicious for malignancy. Images were
processed with CAD.
IMPRESSION: No mammographic evidence of malignancy. A result letter of this
screening mammogram will be mailed directly to the patient.

RECOMMENDATION:
Screening mammogram in one year. (Code:[id number])

BI-RADS CATEGORY  1: Negative.

## 2021-09-18 ENCOUNTER — Ambulatory Visit
Admission: RE | Admit: 2021-09-18 | Discharge: 2021-09-18 | Disposition: A | Discharge status: Home / Self Care | Payer: BC Managed Care – PPO | Source: Ambulatory Visit | Attending: Endocrinology | Admitting: Endocrinology

## 2021-09-18 DIAGNOSIS — D3500 Benign neoplasm of unspecified adrenal gland: Secondary | ICD-10-CM

## 2021-09-18 DIAGNOSIS — D3501 Benign neoplasm of right adrenal gland: Secondary | ICD-10-CM | POA: Diagnosis not present

## 2021-09-18 DIAGNOSIS — M419 Scoliosis, unspecified: Secondary | ICD-10-CM | POA: Diagnosis not present

## 2021-09-18 DIAGNOSIS — K828 Other specified diseases of gallbladder: Secondary | ICD-10-CM | POA: Diagnosis not present

## 2021-09-18 DIAGNOSIS — E249 Cushing's syndrome, unspecified: Secondary | ICD-10-CM

## 2021-09-18 DIAGNOSIS — M47816 Spondylosis without myelopathy or radiculopathy, lumbar region: Secondary | ICD-10-CM | POA: Diagnosis not present

## 2021-09-24 DIAGNOSIS — R208 Other disturbances of skin sensation: Secondary | ICD-10-CM | POA: Diagnosis not present

## 2021-09-24 DIAGNOSIS — L814 Other melanin hyperpigmentation: Secondary | ICD-10-CM | POA: Diagnosis not present

## 2021-09-24 DIAGNOSIS — L570 Actinic keratosis: Secondary | ICD-10-CM | POA: Diagnosis not present

## 2021-09-24 DIAGNOSIS — L538 Other specified erythematous conditions: Secondary | ICD-10-CM | POA: Diagnosis not present

## 2021-09-24 DIAGNOSIS — L820 Inflamed seborrheic keratosis: Secondary | ICD-10-CM | POA: Diagnosis not present

## 2021-09-24 DIAGNOSIS — L821 Other seborrheic keratosis: Secondary | ICD-10-CM | POA: Diagnosis not present

## 2021-09-24 DIAGNOSIS — L309 Dermatitis, unspecified: Secondary | ICD-10-CM | POA: Diagnosis not present

## 2021-09-24 DIAGNOSIS — D225 Melanocytic nevi of trunk: Secondary | ICD-10-CM | POA: Diagnosis not present

## 2021-10-01 DIAGNOSIS — E042 Nontoxic multinodular goiter: Secondary | ICD-10-CM | POA: Diagnosis not present

## 2021-10-01 DIAGNOSIS — C8330 Diffuse large B-cell lymphoma, unspecified site: Secondary | ICD-10-CM | POA: Diagnosis not present

## 2021-10-01 DIAGNOSIS — E782 Mixed hyperlipidemia: Secondary | ICD-10-CM | POA: Diagnosis not present

## 2021-10-01 DIAGNOSIS — Z0000 Encounter for general adult medical examination without abnormal findings: Secondary | ICD-10-CM | POA: Diagnosis not present

## 2021-10-01 DIAGNOSIS — R7303 Prediabetes: Secondary | ICD-10-CM | POA: Diagnosis not present

## 2021-10-04 DIAGNOSIS — E119 Type 2 diabetes mellitus without complications: Secondary | ICD-10-CM | POA: Diagnosis not present

## 2021-10-04 DIAGNOSIS — E249 Cushing's syndrome, unspecified: Secondary | ICD-10-CM | POA: Diagnosis not present

## 2021-10-17 DIAGNOSIS — E249 Cushing's syndrome, unspecified: Secondary | ICD-10-CM | POA: Diagnosis not present

## 2021-10-17 DIAGNOSIS — E119 Type 2 diabetes mellitus without complications: Secondary | ICD-10-CM | POA: Diagnosis not present

## 2021-10-17 DIAGNOSIS — Z79899 Other long term (current) drug therapy: Secondary | ICD-10-CM | POA: Diagnosis not present

## 2021-10-17 DIAGNOSIS — D3500 Benign neoplasm of unspecified adrenal gland: Secondary | ICD-10-CM | POA: Diagnosis not present

## 2021-10-17 DIAGNOSIS — L689 Hypertrichosis, unspecified: Secondary | ICD-10-CM | POA: Diagnosis not present

## 2021-10-23 DIAGNOSIS — Z79899 Other long term (current) drug therapy: Secondary | ICD-10-CM | POA: Diagnosis not present

## 2021-10-23 DIAGNOSIS — E249 Cushing's syndrome, unspecified: Secondary | ICD-10-CM | POA: Diagnosis not present

## 2021-10-24 DIAGNOSIS — E249 Cushing's syndrome, unspecified: Secondary | ICD-10-CM | POA: Diagnosis not present

## 2021-10-24 DIAGNOSIS — Z79899 Other long term (current) drug therapy: Secondary | ICD-10-CM | POA: Diagnosis not present

## 2021-10-24 DIAGNOSIS — D3500 Benign neoplasm of unspecified adrenal gland: Secondary | ICD-10-CM | POA: Diagnosis not present

## 2021-12-21 DIAGNOSIS — H52203 Unspecified astigmatism, bilateral: Secondary | ICD-10-CM | POA: Diagnosis not present

## 2021-12-21 DIAGNOSIS — E119 Type 2 diabetes mellitus without complications: Secondary | ICD-10-CM | POA: Diagnosis not present

## 2021-12-31 DIAGNOSIS — M150 Primary generalized (osteo)arthritis: Secondary | ICD-10-CM | POA: Diagnosis not present

## 2021-12-31 DIAGNOSIS — E240 Pituitary-dependent Cushing's disease: Secondary | ICD-10-CM | POA: Diagnosis not present

## 2021-12-31 DIAGNOSIS — M79645 Pain in left finger(s): Secondary | ICD-10-CM | POA: Diagnosis not present

## 2021-12-31 DIAGNOSIS — C8590 Non-Hodgkin lymphoma, unspecified, unspecified site: Secondary | ICD-10-CM | POA: Diagnosis not present

## 2021-12-31 DIAGNOSIS — G8929 Other chronic pain: Secondary | ICD-10-CM | POA: Diagnosis not present

## 2022-01-01 DIAGNOSIS — Z800 Family history of malignant neoplasm of digestive organs: Secondary | ICD-10-CM | POA: Diagnosis not present

## 2022-01-01 DIAGNOSIS — Z86010 Personal history of colonic polyps: Secondary | ICD-10-CM | POA: Diagnosis not present

## 2022-02-13 ENCOUNTER — Other Ambulatory Visit: Payer: Self-pay | Admitting: Endocrinology

## 2022-02-13 DIAGNOSIS — E049 Nontoxic goiter, unspecified: Secondary | ICD-10-CM

## 2022-02-14 ENCOUNTER — Other Ambulatory Visit: Payer: Self-pay | Admitting: Family Medicine

## 2022-02-14 DIAGNOSIS — Z1231 Encounter for screening mammogram for malignant neoplasm of breast: Secondary | ICD-10-CM

## 2022-02-15 ENCOUNTER — Encounter: Payer: Self-pay | Admitting: Hematology and Oncology

## 2022-02-26 ENCOUNTER — Telehealth: Payer: Self-pay | Admitting: Gastroenterology

## 2022-02-26 NOTE — Telephone Encounter (Signed)
Hi Dr. Silverio Decamp, ? ?Patient is preferring you as GI provider. ? ?We received records for review for transfer of care. Patient has short history with [facility]. Per patient, [facility] "cannot do procedures locally." Last colonoscopy/EGD 10/18/2016. Some records available on care everywhere. I will also send up operative and pathology results. Please advise on scheduling. ? ?Thank you ?

## 2022-02-27 NOTE — Telephone Encounter (Signed)
Called patient to schedule, no response. Left voicemail. ?

## 2022-02-28 ENCOUNTER — Encounter: Payer: Self-pay | Admitting: Gastroenterology

## 2022-02-28 DIAGNOSIS — D3500 Benign neoplasm of unspecified adrenal gland: Secondary | ICD-10-CM | POA: Diagnosis not present

## 2022-02-28 DIAGNOSIS — E049 Nontoxic goiter, unspecified: Secondary | ICD-10-CM | POA: Diagnosis not present

## 2022-02-28 DIAGNOSIS — E1169 Type 2 diabetes mellitus with other specified complication: Secondary | ICD-10-CM | POA: Diagnosis not present

## 2022-03-01 ENCOUNTER — Ambulatory Visit
Admission: RE | Admit: 2022-03-01 | Discharge: 2022-03-01 | Disposition: A | Discharge status: Home / Self Care | Payer: BC Managed Care – PPO | Source: Ambulatory Visit | Attending: Family Medicine | Admitting: Family Medicine

## 2022-03-01 DIAGNOSIS — Z1231 Encounter for screening mammogram for malignant neoplasm of breast: Secondary | ICD-10-CM | POA: Diagnosis not present

## 2022-03-05 ENCOUNTER — Encounter: Payer: Self-pay | Admitting: Hematology and Oncology

## 2022-03-07 ENCOUNTER — Inpatient Hospital Stay: Payer: BC Managed Care – PPO

## 2022-03-07 ENCOUNTER — Other Ambulatory Visit: Payer: Self-pay

## 2022-03-07 ENCOUNTER — Inpatient Hospital Stay: Payer: BC Managed Care – PPO | Attending: Hematology and Oncology | Admitting: Hematology and Oncology

## 2022-03-07 DIAGNOSIS — Z9221 Personal history of antineoplastic chemotherapy: Secondary | ICD-10-CM | POA: Diagnosis not present

## 2022-03-07 DIAGNOSIS — G629 Polyneuropathy, unspecified: Secondary | ICD-10-CM

## 2022-03-07 DIAGNOSIS — Z720 Tobacco use: Secondary | ICD-10-CM

## 2022-03-07 DIAGNOSIS — Z923 Personal history of irradiation: Secondary | ICD-10-CM | POA: Insufficient documentation

## 2022-03-07 DIAGNOSIS — Z8572 Personal history of non-Hodgkin lymphomas: Secondary | ICD-10-CM

## 2022-03-08 ENCOUNTER — Telehealth: Payer: Self-pay | Admitting: Hematology and Oncology

## 2022-03-08 ENCOUNTER — Encounter: Payer: Self-pay | Admitting: Hematology and Oncology

## 2022-03-08 DIAGNOSIS — G629 Polyneuropathy, unspecified: Secondary | ICD-10-CM | POA: Insufficient documentation

## 2022-03-08 NOTE — Assessment & Plan Note (Signed)
I spent some time counseling the patient the importance of tobacco cessation. ?She is currently interested to quit on her own ? ?

## 2022-03-08 NOTE — Assessment & Plan Note (Signed)
Clinically, she have no signs of disease recurrence ?We will discuss signs and symptoms to watch out for ?I will see her again in a year for further follow-up ?

## 2022-03-08 NOTE — Telephone Encounter (Signed)
Scheduled appointment per 5/11 los. Patient is aware. ?

## 2022-03-08 NOTE — Assessment & Plan Note (Signed)
She has neuropathy in the feet ?We discussed risk factors for neuropathy including diabetes as well as alcohol use ?I recommend the patient to continue to work on dietary modification and weight loss that could help control her blood sugar and to abstain from alcohol intake ?

## 2022-03-08 NOTE — Progress Notes (Signed)
Alexandra Allen ?OFFICE PROGRESS NOTE ? ?Patient Care Team: ?London Pepper, MD as PCP - General (Family Medicine) ?Stark Klein, MD as Consulting Physician (General Surgery) ?Izora Gala, MD as Consulting Physician (Otolaryngology) ? ?ASSESSMENT & PLAN:  ?History of B-cell lymphoma ?Clinically, she have no signs of disease recurrence ?We will discuss signs and symptoms to watch out for ?I will see her again in a year for further follow-up ? ?Neuropathy ?She has neuropathy in the feet ?We discussed risk factors for neuropathy including diabetes as well as alcohol use ?I recommend the patient to continue to work on dietary modification and weight loss that could help control her blood sugar and to abstain from alcohol intake ? ?Tobacco abuse ?I spent some time counseling the patient the importance of tobacco cessation. ?She is currently interested to quit on her own ? ? ?No orders of the defined types were placed in this encounter. ? ? ?All questions were answered. The patient knows to call the clinic with any problems, questions or concerns. ?The total time spent in the appointment was 20 minutes encounter with patients including review of chart and various tests results, discussions about plan of care and coordination of care plan ?  ?Heath Lark, MD ?03/08/2022 10:12 AM ? ?INTERVAL HISTORY: ?Please see below for problem oriented charting. ?she returns for surveillance follow-up ?She have no signs or symptoms to suggest cancer recurrence such as lymphadenopathy or abnormal night sweats ?She have lost some weight through dietary modification and exercise ?She is attempting to quit smoking to her best of ability, currently about 5 to 10 cigarettes/day ?She has noted some peripheral neuropathy in her toes on both sides ?She complains of chronic fatigue ? ?REVIEW OF SYSTEMS:   ?Constitutional: Denies fevers, chills or abnormal weight loss ?Eyes: Denies blurriness of vision ?Ears, nose, mouth, throat, and face:  Denies mucositis or sore throat ?Respiratory: Denies cough, dyspnea or wheezes ?Cardiovascular: Denies palpitation, chest discomfort or lower extremity swelling ?Gastrointestinal:  Denies nausea, heartburn or change in bowel habits ?Skin: Denies abnormal skin rashes ?Lymphatics: Denies new lymphadenopathy or easy bruising ?Behavioral/Psych: Mood is stable, no new changes  ?All other systems were reviewed with the patient and are negative. ? ?I have reviewed the past medical history, past surgical history, social history and family history with the patient and they are unchanged from previous note. ? ?ALLERGIES:  is allergic to adhesive [tape], influenza vaccines, prednisone, and sulfa antibiotics. ? ?MEDICATIONS:  ?Current Outpatient Medications  ?Medication Sig Dispense Refill  ? ibuprofen (ADVIL) 200 MG tablet Take 200 mg by mouth every 6 (six) hours as needed.    ? Multiple Vitamins-Minerals (MULTIVITAMIN PO) Take by mouth daily.    ? Omega-3 Fatty Acids (FISH OIL PO) Take by mouth.    ? Probiotic Product (PROBIOTIC DAILY PO) Take by mouth.    ? Turmeric, Curcuma Longa, (CURCUMIN) POWD by Does not apply route daily.    ? ?No current facility-administered medications for this visit.  ? ? ?SUMMARY OF ONCOLOGIC HISTORY: ?Oncology History Overview Note  ?Diffuse large B cell lymphoma ?  Staging form: Lymphoid Neoplasms, AJCC 6th Edition ?    Clinical stage from 12/01/2014: Stage I - Signed by Heath Lark, MD on 12/01/2014 ? ? ? ? ?  ?History of B-cell lymphoma  ?09/08/2014 Imaging  ? Ultrasound pelvis detected abnormal soft tissue mass in the left groin ? ?  ?09/13/2014 Imaging  ? Ultrasound abdomen showed up to 7 cm mass-like area of mild hypoechogenicity  in the left hepatic lobe ? ?  ?09/21/2014 Imaging  ? 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 ?  ?10/24/2014 Procedure  ? She underwent ultrasound-guided biopsy of the left groin mass ? ?  ?10/24/2014 Pathology  Results  ? Accession: [id number] confirm diagnosis of diffuse large B-cell lymphoma ? ?  ?11/15/2014 Imaging  ? Echocardiogram showed normal ejection fraction ? ?  ?11/17/2014 Procedure  ? She has port placement. ? ?  ?11/23/2014 Bone Marrow Biopsy  ? Accession: [id number] is negative for lymphoma ? ?  ?11/30/2014 - 01/11/2015 Chemotherapy  ? She received chemotherapy with RCHOP X 3 cycles ? ?  ?11/30/2014 Adverse Reaction  ? Treatment was complicated by mild hypersensitivity reaction to rituximab ? ?  ?01/30/2015 Imaging  ? Repeat PET CT scan showed complete response to the lymphadenopathy. ? ?  ?02/23/2015 - 03/22/2015 Radiation Therapy  ? She received radiation therapy 36 Gy in 20 fractions to left groin area ? ?  ?08/01/2015 Imaging  ? PET scan showed complete response to Rx ? ?  ?08/13/2016 Imaging  ? Ct scan showed no adenopathy identified within the abdomen or pelvis. ? ?  ?08/20/2017 Imaging  ? 1. No findings of active lymphoma. ?2. Hepatic hemangiomas and bilateral adrenal adenomas. ?3. Right posterior thyroid lobe nodule was not previously hypermetabolic and accordingly is probably benign. ?4. Calcified granuloma in the left upper lobe. No worrisome pulmonary nodule. ?5.  Aortic Atherosclerosis (ICD10-I70.0). ?6. Mild lumbar scoliosis and degenerative disc disease. ?7. Diffuse hepatic steatosis. ? ?  ? ? ?PHYSICAL EXAMINATION: ?ECOG PERFORMANCE STATUS: 1 - Symptomatic but completely ambulatory ? ?Vitals:  ? 03/07/22 0959  ?BP: 136/79  ?Pulse: 61  ?Resp: 18  ?Temp: 98.5 ?F (36.9 ?C)  ?SpO2: 100%  ? ?Filed Weights  ? 03/07/22 0959  ?Weight: 167 lb 3.2 oz (75.8 kg)  ? ? ?GENERAL:alert, no distress and comfortable ?SKIN: skin color, texture, turgor are normal, no rashes or significant lesions ?EYES: normal, Conjunctiva are pink and non-injected, sclera clear ?OROPHARYNX:no exudate, no erythema and lips, buccal mucosa, and tongue normal  ?NECK: supple, thyroid normal size, non-tender, without nodularity ?LYMPH:  no palpable  lymphadenopathy in the cervical, axillary or inguinal ?LUNGS: clear to auscultation and percussion with normal breathing effort ?HEART: regular rate & rhythm and no murmurs and no lower extremity edema ?ABDOMEN:abdomen soft, non-tender and normal bowel sounds ?Musculoskeletal:no cyanosis of digits and no clubbing  ?NEURO: alert & oriented x 3 with fluent speech, no focal motor/sensory deficits ? ?LABORATORY DATA:  ?I have reviewed the data as listed ?   ?Component Value Date/Time  ? NA 142 03/23/2020 0950  ? NA 141 08/14/2017 0931  ? K 4.3 03/23/2020 0950  ? K 4.3 08/14/2017 0931  ? CL 108 03/23/2020 0950  ? CO2 27 03/23/2020 0950  ? CO2 26 08/14/2017 0931  ? GLUCOSE 107 (H) 03/23/2020 0950  ? GLUCOSE 100 08/14/2017 0931  ? BUN 12 03/23/2020 0950  ? BUN 13.0 08/14/2017 0931  ? CREATININE 0.75 03/23/2020 0950  ? CREATININE 0.8 08/14/2017 0931  ? CALCIUM 9.5 03/23/2020 0950  ? CALCIUM 10.1 08/14/2017 0931  ? PROT 6.8 03/23/2020 0950  ? PROT 7.6 08/14/2017 0931  ? ALBUMIN 3.7 03/23/2020 0950  ? ALBUMIN 4.0 08/14/2017 0931  ? AST 26 03/23/2020 0950  ? AST 24 08/14/2017 0931  ? ALT 42 03/23/2020 0950  ? ALT 29 08/14/2017 0931  ? ALKPHOS 45 03/23/2020 0950  ?  ALKPHOS 49 08/14/2017 0931  ? BILITOT 0.3 03/23/2020 0950  ? BILITOT 0.38 08/14/2017 0931  ? GFRNONAA >60 03/23/2020 0950  ? GFRAA >60 03/23/2020 0950  ? ? ?No results found for: SPEP, UPEP ? ?Lab Results  ?Component Value Date  ? WBC 5.7 03/23/2020  ? NEUTROABS 3.5 03/23/2020  ? HGB 14.1 03/23/2020  ? HCT 42.1 03/23/2020  ? MCV 98.4 03/23/2020  ? PLT 262 03/23/2020  ? ? ?  Chemistry   ?   ?Component Value Date/Time  ? NA 142 03/23/2020 0950  ? NA 141 08/14/2017 0931  ? K 4.3 03/23/2020 0950  ? K 4.3 08/14/2017 0931  ? CL 108 03/23/2020 0950  ? CO2 27 03/23/2020 0950  ? CO2 26 08/14/2017 0931  ? BUN 12 03/23/2020 0950  ? BUN 13.0 08/14/2017 0931  ? CREATININE 0.75 03/23/2020 0950  ? CREATININE 0.8 08/14/2017 0931  ?    ?Component Value Date/Time  ? CALCIUM 9.5  03/23/2020 0950  ? CALCIUM 10.1 08/14/2017 0931  ? ALKPHOS 45 03/23/2020 0950  ? ALKPHOS 49 08/14/2017 0931  ? AST 26 03/23/2020 0950  ? AST 24 08/14/2017 0931  ? ALT 42 03/23/2020 0950  ? ALT 29 08/14/2017 0

## 2022-03-14 ENCOUNTER — Telehealth: Payer: Self-pay

## 2022-03-14 NOTE — Telephone Encounter (Signed)
Patient called back and said she cannot take the earlier date.

## 2022-03-14 NOTE — Telephone Encounter (Signed)
Called the patient to offer an appointment for her colonoscopy on 04/25/22 at 10:00 with Dr Silverio Decamp. She is presently scheduled in July. No answer. I left her a message of my call with this date offered.

## 2022-03-14 NOTE — Telephone Encounter (Signed)
Patient returned your call.  Please call back.  Thank you. 

## 2022-03-18 ENCOUNTER — Ambulatory Visit
Admission: RE | Admit: 2022-03-18 | Discharge: 2022-03-18 | Disposition: A | Discharge status: Home / Self Care | Payer: BC Managed Care – PPO | Source: Ambulatory Visit | Attending: Endocrinology | Admitting: Endocrinology

## 2022-03-18 DIAGNOSIS — E049 Nontoxic goiter, unspecified: Secondary | ICD-10-CM

## 2022-03-18 DIAGNOSIS — E042 Nontoxic multinodular goiter: Secondary | ICD-10-CM | POA: Diagnosis not present

## 2022-03-27 DIAGNOSIS — D3500 Benign neoplasm of unspecified adrenal gland: Secondary | ICD-10-CM | POA: Diagnosis not present

## 2022-03-27 DIAGNOSIS — E049 Nontoxic goiter, unspecified: Secondary | ICD-10-CM | POA: Diagnosis not present

## 2022-03-27 DIAGNOSIS — E1169 Type 2 diabetes mellitus with other specified complication: Secondary | ICD-10-CM | POA: Diagnosis not present

## 2022-03-27 DIAGNOSIS — E249 Cushing's syndrome, unspecified: Secondary | ICD-10-CM | POA: Diagnosis not present

## 2022-04-01 DIAGNOSIS — E119 Type 2 diabetes mellitus without complications: Secondary | ICD-10-CM | POA: Diagnosis not present

## 2022-04-01 DIAGNOSIS — J410 Simple chronic bronchitis: Secondary | ICD-10-CM | POA: Diagnosis not present

## 2022-04-01 DIAGNOSIS — F17200 Nicotine dependence, unspecified, uncomplicated: Secondary | ICD-10-CM | POA: Diagnosis not present

## 2022-04-08 DIAGNOSIS — C8590 Non-Hodgkin lymphoma, unspecified, unspecified site: Secondary | ICD-10-CM | POA: Diagnosis not present

## 2022-04-08 DIAGNOSIS — M79645 Pain in left finger(s): Secondary | ICD-10-CM | POA: Diagnosis not present

## 2022-04-08 DIAGNOSIS — M1991 Primary osteoarthritis, unspecified site: Secondary | ICD-10-CM | POA: Diagnosis not present

## 2022-04-08 DIAGNOSIS — G8929 Other chronic pain: Secondary | ICD-10-CM | POA: Diagnosis not present

## 2022-04-09 ENCOUNTER — Telehealth: Payer: Self-pay | Admitting: Gastroenterology

## 2022-04-09 ENCOUNTER — Other Ambulatory Visit: Payer: Self-pay

## 2022-04-09 NOTE — Telephone Encounter (Signed)
Inbound call from patient calling to check up on her up coming procedure, patient was wanting to make sure on 7/13 she was going to have both endo and colon. I advised patient that she was currently scheduled only for the colon. Patient stated that she was wanting to have both done. Patient is requesting a call back to discuss this matter. Please advise.

## 2022-04-09 NOTE — Telephone Encounter (Signed)
History of B-cell lymphoma status posttreatment.  Longstanding GERD history with persistent symptoms despite treatment.  Agree with proceeding with EGD for further evaluation.  Beth, can you please check if I can start at 730 that day so we can add in the EGD to keep her on the schedule for the same day.  Thank you

## 2022-04-11 ENCOUNTER — Ambulatory Visit (AMBULATORY_SURGERY_CENTER): Payer: Self-pay | Admitting: *Deleted

## 2022-04-11 VITALS — Ht 63.0 in | Wt 167.0 lb

## 2022-04-11 DIAGNOSIS — K219 Gastro-esophageal reflux disease without esophagitis: Secondary | ICD-10-CM

## 2022-04-11 DIAGNOSIS — Z86010 Personal history of colonic polyps: Secondary | ICD-10-CM

## 2022-04-11 MED ORDER — NA SULFATE-K SULFATE-MG SULF 17.5-3.13-1.6 GM/177ML PO SOLN: 1.0000 | ORAL | 0 refills | Status: DC

## 2022-04-11 NOTE — Progress Notes (Signed)
Patient is here in-person for PV. Patient denies any allergies to eggs or soy. Patient denies any problems with anesthesia/sedation. Patient is not on any oxygen at home. Patient is not taking any diet/weight loss medications or blood thinners. Patient is aware of our care-partner policy. Patient notified to use Good-Rx for prescription.   EMMI education assigned to the patient for the procedure, sent to [facility].

## 2022-04-20 DIAGNOSIS — Z720 Tobacco use: Secondary | ICD-10-CM | POA: Diagnosis not present

## 2022-05-06 ENCOUNTER — Encounter: Payer: Self-pay | Admitting: Gastroenterology

## 2022-05-09 ENCOUNTER — Ambulatory Visit (AMBULATORY_SURGERY_CENTER): Payer: BC Managed Care – PPO | Admitting: Gastroenterology

## 2022-05-09 ENCOUNTER — Encounter: Payer: Self-pay | Admitting: Gastroenterology

## 2022-05-09 VITALS — BP 135/86 | HR 73 | Temp 98.0°F | Resp 16 | Ht 63.0 in | Wt 167.0 lb

## 2022-05-09 DIAGNOSIS — Z09 Encounter for follow-up examination after completed treatment for conditions other than malignant neoplasm: Secondary | ICD-10-CM

## 2022-05-09 DIAGNOSIS — K2970 Gastritis, unspecified, without bleeding: Secondary | ICD-10-CM

## 2022-05-09 DIAGNOSIS — K222 Esophageal obstruction: Secondary | ICD-10-CM | POA: Diagnosis not present

## 2022-05-09 DIAGNOSIS — D125 Benign neoplasm of sigmoid colon: Secondary | ICD-10-CM

## 2022-05-09 DIAGNOSIS — K449 Diaphragmatic hernia without obstruction or gangrene: Secondary | ICD-10-CM

## 2022-05-09 DIAGNOSIS — K219 Gastro-esophageal reflux disease without esophagitis: Secondary | ICD-10-CM

## 2022-05-09 DIAGNOSIS — D124 Benign neoplasm of descending colon: Secondary | ICD-10-CM

## 2022-05-09 DIAGNOSIS — K635 Polyp of colon: Secondary | ICD-10-CM | POA: Diagnosis not present

## 2022-05-09 DIAGNOSIS — Z86010 Personal history of colonic polyps: Secondary | ICD-10-CM | POA: Diagnosis not present

## 2022-05-09 MED ORDER — PANTOPRAZOLE SODIUM 40 MG PO TBEC: 40.0000 mg | DELAYED_RELEASE_TABLET | Freq: Every day | ORAL | 11 refills | Status: DC

## 2022-05-09 MED ORDER — SODIUM CHLORIDE 0.9 % IV SOLN: 500.0000 mL | Freq: Once | INTRAVENOUS | Status: DC

## 2022-05-09 NOTE — Patient Instructions (Signed)
YOU HAD AN ENDOSCOPIC PROCEDURE TODAY: Refer to the procedure report and other information in the discharge instructions given to you for any specific questions about what was found during the examination. If this information does not answer your questions, please call [facility] office at [phone] to clarify.  ° °YOU SHOULD EXPECT: Some feelings of bloating in the abdomen. Passage of more gas than usual. Walking can help get rid of the air that was put into your GI tract during the procedure and reduce the bloating. If you had a lower endoscopy (such as a colonoscopy or flexible sigmoidoscopy) you may notice spotting of blood in your stool or on the toilet paper. Some abdominal soreness may be present for a day or two, also. ° °DIET: Your first meal following the procedure should be a light meal and then it is ok to progress to your normal diet. A half-sandwich or bowl of soup is an example of a good first meal. Heavy or fried foods are harder to digest and may make you feel nauseous or bloated. Drink plenty of fluids but you should avoid alcoholic beverages for 24 hours. If you had a esophageal dilation, please see attached instructions for diet.   ° °ACTIVITY: Your care partner should take you home directly after the procedure. You should plan to take it easy, moving slowly for the rest of the day. You can resume normal activity the day after the procedure however YOU SHOULD NOT DRIVE, use power tools, machinery or perform tasks that involve climbing or major physical exertion for 24 hours (because of the sedation medicines used during the test).  ° °SYMPTOMS TO REPORT IMMEDIATELY: °A gastroenterologist can be reached at any hour. Please call [phone]  for any of the following symptoms:  °Following lower endoscopy (colonoscopy, flexible sigmoidoscopy) °Excessive amounts of blood in the stool  °Significant tenderness, worsening of abdominal pains  °Swelling of the abdomen that is new, acute  °Fever of 100° or  higher  °Following upper endoscopy (EGD, EUS, ERCP, esophageal dilation) °Vomiting of blood or coffee ground material  °New, significant abdominal pain  °New, significant chest pain or pain under the shoulder blades  °Painful or persistently difficult swallowing  °New shortness of breath  °Black, tarry-looking or red, bloody stools ° °FOLLOW UP:  °If any biopsies were taken you will be contacted by phone or by letter within the next 1-3 weeks. Call [phone]  if you have not heard about the biopsies in 3 weeks.  °Please also call with any specific questions about appointments or follow up tests. ° °

## 2022-05-09 NOTE — Progress Notes (Signed)
[name]   Primary Care Physician:  London Pepper, MD   Reason for Procedure:  GERD and h/o colon polyps  Plan:    EGD and colonoscopy with possible interventions as needed     HPI: Alexandra Allen is a very pleasant 61 y.o. female here for EGD and colonoscopy for persistent GERD symptoms and h/o colon polyps. Denies any nausea, vomiting, abdominal pain, melena or bright red blood per rectum  The risks and benefits as well as alternatives of endoscopic procedure(s) have been discussed and reviewed. All questions answered. The patient agrees to proceed.    Past Medical History:  Diagnosis Date   Adrenal adenoma 08/03/2015   Arthritis    Cancer ([place]) 10/2014   large B cell lymphoma   Diffuse large B cell lymphoma ([place]) 11/09/2014   GERD (gastroesophageal reflux disease)    takes DGL(over the counter meds)   History of bronchitis    Hoarseness    Hoarseness of voice 08/03/2015   Joint pain    Joint swelling    Lightheaded    occasionally    Myalgia    Skin infection 01/23/2015   back   Thyroid disease    multiple nodules   Urinary frequency    Vocal cord edema     Past Surgical History:  Procedure Laterality Date   ABDOMINAL HYSTERECTOMY  10/29/1999   ADRENAL GLAND SURGERY  08/2021   fluid sample taken at [facility]  2017   Dr.Mann   LARYNGOSCOPY     LYMPH NODE BIOPSY     under left arm and groin biopsy   MICROLARYNGOSCOPY Bilateral 10/16/2015   Procedure: MICROLARYNGOSCOPY WITH REMOVAL OF VOCAL CORD POLYP ;  Surgeon: Izora Gala, MD;  Location: [facility];  Service: ENT;  Laterality: Bilateral;   PORT-A-CATH REMOVAL Left 10/04/2015   Procedure: REMOVAL PORT-A-CATH;  Surgeon: Stark Klein, MD;  Location: [facility];  Service: General;  Laterality: Left;   PORTACATH PLACEMENT Left 11/17/2014   Procedure: INSERTION PORT-A-CATH LEFT SUBCLAVIAN;  Surgeon: Stark Klein,  MD;  Location: [facility];  Service: General;  Laterality: Left;   UPPER GASTROINTESTINAL ENDOSCOPY  2017   Dr.Mann    Prior to Admission medications   Medication Sig Start Date End Date Taking? Authorizing Provider  benzonatate (TESSALON) 200 MG capsule Take by mouth. 10/28/20  Yes [provider]  Cholecalciferol (VITAMIN D) 50 MCG (2000 UT) CAPS 1 capsule 03/11/22  Yes [provider]  ibuprofen (ADVIL) 200 MG tablet Take 200 mg by mouth every 6 (six) hours as needed.   Yes [provider]  Multiple Vitamins-Minerals (MULTIVITAMIN PO) Take by mouth daily.   Yes [provider]  NON FORMULARY CBD oil po   Yes [provider]  Omega-3 Fatty Acids (FISH OIL PO) Take by mouth.   Yes [provider]  Probiotic Product (PROBIOTIC DAILY PO) Take by mouth.   Yes [provider]  Turmeric (CURCUMIN 95) 500 MG CAPS as directed Orally    [provider]    Current Outpatient Medications  Medication Sig Dispense Refill   benzonatate (TESSALON) 200 MG capsule Take by mouth.     Cholecalciferol (VITAMIN D) 50 MCG (2000 UT) CAPS 1 capsule     ibuprofen (ADVIL) 200 MG tablet Take 200 mg by mouth every 6 (six) hours as needed.     Multiple Vitamins-Minerals (MULTIVITAMIN PO) Take by mouth daily.  NON FORMULARY CBD oil po     Omega-3 Fatty Acids (FISH OIL PO) Take by mouth.     Probiotic Product (PROBIOTIC DAILY PO) Take by mouth.     Turmeric (CURCUMIN 95) 500 MG CAPS as directed Orally     Current Facility-Administered Medications  Medication Dose Route Frequency Provider Last Rate Last Admin   0.9 %  sodium chloride infusion  500 mL Intravenous Once Mauri Pole, MD        Allergies as of 05/09/2022 - Review Complete 05/09/2022  Allergen Reaction Noted   Adhesive [tape]  11/16/2014   Influenza vaccines Other (See Comments) 11/09/2014   Omeprazole-sodium bicarbonate  05/09/2022   Prednisone Other (See Comments)  11/09/2014   Latex Rash 07/27/2019   Sulfa antibiotics Rash 11/09/2014    Family History  Problem Relation Age of Onset   Breast cancer Mother        breast and then died of endometrial cancer   Hypertension Mother    Endometrial cancer Mother    Colon cancer Father 30   Cancer Father        colon, skin and prostate   Hypertension Father    Heart disease Father    Diabetes Sister        borderline   Hypertension Sister    Colon cancer Maternal Grandfather    Esophageal cancer Neg Hx    Stomach cancer Neg Hx    Rectal cancer Neg Hx     Social History   Socioeconomic History   Marital status: Single    Spouse name: Not on file   Number of children: Not on file   Years of education: Not on file   Highest education level: Not on file  Occupational History   Not on file  Tobacco Use   Smoking status: Every Day    Packs/day: 0.75    Years: 35.00    Total pack years: 26.25    Types: Cigarettes   Smokeless tobacco: Never  Vaping Use   Vaping Use: Never used  Substance and Sexual Activity   Alcohol use: Yes    Alcohol/week: 5.0 standard drinks of alcohol    Types: 5 Glasses of wine per week   Drug use: No   Sexual activity: Yes  Other Topics Concern   Not on file  Social History Narrative   Not on file   Social Determinants of Health   Financial Resource Strain: Not on file  Food Insecurity: Not on file  Transportation Needs: Not on file  Physical Activity: Not on file  Stress: Not on file  Social Connections: Not on file  Intimate Partner Violence: Not on file    Review of Systems:  All other review of systems negative except as mentioned in the HPI.  Physical Exam: Vital signs in last 24 hours: BP 130/84   Pulse 73   Temp 98 F (36.7 C) (Temporal)   Ht '5\' 3"'$  (1.6 m)   Wt 167 lb (75.8 kg)   SpO2 97%   BMI 29.58 kg/m  General:   Alert, NAD Lungs:  Clear .   Heart:  Regular rate and rhythm Abdomen:  Soft, nontender and  nondistended. Neuro/Psych:  Alert and cooperative. Normal mood and affect. A and O x 3  Reviewed labs, radiology imaging, old records and pertinent past GI work up  Patient is appropriate for planned procedure(s) and anesthesia in an ambulatory setting   K. Denzil Magnuson , MD [phone]

## 2022-05-09 NOTE — Progress Notes (Signed)
Pt in recovery with monitors in place, VSS. Report given to receiving RN. Bite guard was placed with pt awake to ensure comfort. No dental or soft tissue damage noted. 

## 2022-05-09 NOTE — Op Note (Signed)
[facility] Patient Name: Alexandra Allen Procedure Date: 05/09/2022 9:40 AM MRN: [id number] Endoscopist: Mauri Pole , MD Age: 61 Referring MD:  Date of Birth: [birth year] Gender: Female Account #: [id number] Procedure:                Upper GI endoscopy Indications:              Esophageal reflux symptoms that recur despite                            appropriate therapy Medicines:                Monitored Anesthesia Care Procedure:                Pre-Anesthesia Assessment:                           - Prior to the procedure, a History and Physical                            was performed, and patient medications and                            allergies were reviewed. The patient's tolerance of                            previous anesthesia was also reviewed. The risks                            and benefits of the procedure and the sedation                            options and risks were discussed with the patient.                            All questions were answered, and informed consent                            was obtained. Prior Anticoagulants: The patient has                            taken no previous anticoagulant or antiplatelet                            agents. ASA Grade Assessment: II - A patient with                            mild systemic disease. After reviewing the risks                            and benefits, the patient was deemed in                            satisfactory condition to undergo the procedure.  After obtaining informed consent, the endoscope was                            passed under direct vision. Throughout the                            procedure, the patient's blood pressure, pulse, and                            oxygen saturations were monitored continuously. The                            Endoscope was introduced through the mouth, and                            advanced to the second part of  duodenum. The upper                            GI endoscopy was accomplished without difficulty.                            The patient tolerated the procedure well. Scope In: Scope Out: Findings:                 A non-obstructing Schatzki ring was found in the                            distal esophagus.                           A small hiatal hernia was present.                           The exam of the esophagus was otherwise normal.                           Patchy mild inflammation characterized by                            congestion (edema), erosions and erythema was found                            in the entire examined stomach. Biopsies were taken                            with a cold forceps for Helicobacter pylori testing.                           The cardia and gastric fundus were normal on                            retroflexion.                           Patchy mildly erythematous mucosa without active  bleeding and with no stigmata of bleeding was found                            in the duodenal bulb. Complications:            No immediate complications. Estimated Blood Loss:     Estimated blood loss was minimal. Impression:               - Non-obstructing Schatzki ring.                           - Small hiatal hernia.                           - Gastritis. Biopsied.                           - Erythematous duodenopathy. Recommendation:           - Patient has a contact number available for                            emergencies. The signs and symptoms of potential                            delayed complications were discussed with the                            patient. Return to normal activities tomorrow.                            Written discharge instructions were provided to the                            patient.                           - Resume previous diet.                           - Continue present medications.                            - Await pathology results.                           - Follow an antireflux regimen.                           - Use Protonix (pantoprazole) 40 mg PO daily.                            Please send Rx for 30 days with 11 refills                           - Return to GI office in 3 months. Mauri Pole, MD 05/09/2022 10:24:16 AM This report has been signed electronically.

## 2022-05-09 NOTE — Progress Notes (Signed)
Called to room to assist during endoscopic procedure.  Patient ID and intended procedure confirmed with present staff. Received instructions for my participation in the procedure from the performing physician.  

## 2022-05-09 NOTE — Op Note (Signed)
[facility] Patient Name: Alexandra Allen Procedure Date: 05/09/2022 9:40 AM MRN: [id number] Endoscopist: Mauri Pole , MD Age: 61 Referring MD:  Date of Birth: January 14, 1961 Gender: Female Account #: [id number] Procedure:                Colonoscopy Indications:              Screening in patient at increased risk: Colorectal                            cancer in father 24 or older, High risk colon                            cancer surveillance: Personal history of multiple                            (3 or more) adenomas Medicines:                Monitored Anesthesia Care Procedure:                Pre-Anesthesia Assessment:                           - Prior to the procedure, a History and Physical                            was performed, and patient medications and                            allergies were reviewed. The patient's tolerance of                            previous anesthesia was also reviewed. The risks                            and benefits of the procedure and the sedation                            options and risks were discussed with the patient.                            All questions were answered, and informed consent                            was obtained. Prior Anticoagulants: The patient has                            taken no previous anticoagulant or antiplatelet                            agents. ASA Grade Assessment: II - A patient with                            mild systemic disease. After reviewing the risks  and benefits, the patient was deemed in                            satisfactory condition to undergo the procedure.                           After obtaining informed consent, the colonoscope                            was passed under direct vision. Throughout the                            procedure, the patient's blood pressure, pulse, and                            oxygen saturations were monitored  continuously. The                            [id number] PCF-H190TL Slim SB Colonoscope was introduced                            through the anus and advanced to the the cecum,                            identified by appendiceal orifice and ileocecal                            valve. The colonoscopy was performed without                            difficulty. The patient tolerated the procedure                            well. The quality of the bowel preparation was                            adequate. The ileocecal valve, appendiceal orifice,                            and rectum were photographed. Scope In: 9:58:23 AM Scope Out: 10:16:06 AM Scope Withdrawal Time: 0 hours 8 minutes 23 seconds  Total Procedure Duration: 0 hours 17 minutes 43 seconds  Findings:                 The perianal and digital rectal examinations were                            normal.                           Two sessile polyps were found in the sigmoid colon                            and descending colon. The polyps were 4 to 6 mm in  size. These polyps were removed with a cold snare.                            Resection and retrieval were complete.                           Scattered small and large-mouthed diverticula were                            found in the sigmoid colon and descending colon.                           Non-bleeding external and internal hemorrhoids were                            found during retroflexion. The hemorrhoids were                            medium-sized. Complications:            No immediate complications. Estimated Blood Loss:     Estimated blood loss was minimal. Impression:               - Two 4 to 6 mm polyps in the sigmoid colon and in                            the descending colon, removed with a cold snare.                            Resected and retrieved.                           - Moderate diverticulosis in the sigmoid colon and                             in the descending colon.                           - Non-bleeding external and internal hemorrhoids. Recommendation:           - Patient has a contact number available for                            emergencies. The signs and symptoms of potential                            delayed complications were discussed with the                            patient. Return to normal activities tomorrow.                            Written discharge instructions were provided to the                            patient.                           -  Resume previous diet.                           - Continue present medications.                           - Await pathology results.                           - Repeat colonoscopy in 5 years for surveillance                            based on pathology results. Mauri Pole, MD 05/09/2022 10:20:52 AM This report has been signed electronically.

## 2022-05-09 NOTE — Progress Notes (Signed)
Pt's states no medical or surgical changes since previsit or office visit. 

## 2022-05-10 ENCOUNTER — Telehealth: Payer: Self-pay | Admitting: *Deleted

## 2022-05-10 ENCOUNTER — Telehealth: Payer: Self-pay | Admitting: Gastroenterology

## 2022-05-10 NOTE — Telephone Encounter (Signed)
  Follow up Call-     05/09/2022    9:19 AM  Call back number  Post procedure Call Back phone  # [phone]  Permission to leave phone message Yes     Patient questions:   Message left to call if necessary.

## 2022-05-10 NOTE — Telephone Encounter (Signed)
Patient had endo/colon yesterday with Dr. Silverio Decamp.  She is experiencing pain on her right side about mid-back to hips and is very sore.  Her body temperature is about a degree higher than usual.  She knows she had polyps removed yesterday and was wondering if that was where the pain/discomfort was coming from and if what she's experiencing is normal.  Please call patient and advise.  Thank you.

## 2022-05-13 NOTE — Telephone Encounter (Signed)
Called back and left a message on cellphone. Requested her to call back to make sure her symptoms are improving The polyps removed were diminutive, unlikely to cause abdominal pain or discomfort.

## 2022-05-15 ENCOUNTER — Encounter: Payer: Self-pay | Admitting: Gastroenterology

## 2022-05-15 ENCOUNTER — Telehealth: Payer: Self-pay | Admitting: Gastroenterology

## 2022-05-15 DIAGNOSIS — K219 Gastro-esophageal reflux disease without esophagitis: Secondary | ICD-10-CM

## 2022-05-15 MED ORDER — PANTOPRAZOLE SODIUM 40 MG PO TBEC: 40.0000 mg | DELAYED_RELEASE_TABLET | Freq: Every day | ORAL | 3 refills | Status: DC

## 2022-05-15 NOTE — Telephone Encounter (Signed)
Changed patients pharmacy and resent pantoprazole

## 2022-05-15 NOTE — Telephone Encounter (Signed)
Patient called requesting that the prescription for pantoprazole be called into the [facility] in [facility], [place], [place].  She is having to change pharmacies because she is having to use [facility] instead of her insurance to purchase it.  Thank you.

## 2022-06-03 DIAGNOSIS — M2540 Effusion, unspecified joint: Secondary | ICD-10-CM | POA: Diagnosis not present

## 2022-06-05 DIAGNOSIS — M79642 Pain in left hand: Secondary | ICD-10-CM | POA: Diagnosis not present

## 2022-06-05 DIAGNOSIS — M19032 Primary osteoarthritis, left wrist: Secondary | ICD-10-CM | POA: Diagnosis not present

## 2022-06-05 DIAGNOSIS — R2231 Localized swelling, mass and lump, right upper limb: Secondary | ICD-10-CM | POA: Diagnosis not present

## 2022-06-05 DIAGNOSIS — M79641 Pain in right hand: Secondary | ICD-10-CM | POA: Diagnosis not present

## 2022-06-07 ENCOUNTER — Other Ambulatory Visit: Payer: Self-pay | Admitting: Physician Assistant

## 2022-06-07 DIAGNOSIS — R2231 Localized swelling, mass and lump, right upper limb: Secondary | ICD-10-CM

## 2022-06-11 ENCOUNTER — Ambulatory Visit
Admission: RE | Admit: 2022-06-11 | Discharge: 2022-06-11 | Disposition: A | Discharge status: Home / Self Care | Payer: BC Managed Care – PPO | Source: Ambulatory Visit | Attending: Physician Assistant | Admitting: Physician Assistant

## 2022-06-11 DIAGNOSIS — R2231 Localized swelling, mass and lump, right upper limb: Secondary | ICD-10-CM

## 2022-06-11 DIAGNOSIS — R2241 Localized swelling, mass and lump, right lower limb: Secondary | ICD-10-CM | POA: Diagnosis not present

## 2022-07-15 ENCOUNTER — Other Ambulatory Visit: Payer: Self-pay | Admitting: Physician Assistant

## 2022-07-16 DIAGNOSIS — M1991 Primary osteoarthritis, unspecified site: Secondary | ICD-10-CM | POA: Diagnosis not present

## 2022-07-16 DIAGNOSIS — G8929 Other chronic pain: Secondary | ICD-10-CM | POA: Diagnosis not present

## 2022-07-16 DIAGNOSIS — M79645 Pain in left finger(s): Secondary | ICD-10-CM | POA: Diagnosis not present

## 2022-07-16 DIAGNOSIS — C8590 Non-Hodgkin lymphoma, unspecified, unspecified site: Secondary | ICD-10-CM | POA: Diagnosis not present

## 2022-10-01 DIAGNOSIS — R7303 Prediabetes: Secondary | ICD-10-CM | POA: Diagnosis not present

## 2022-10-01 DIAGNOSIS — Z79899 Other long term (current) drug therapy: Secondary | ICD-10-CM | POA: Diagnosis not present

## 2022-10-01 DIAGNOSIS — Z0000 Encounter for general adult medical examination without abnormal findings: Secondary | ICD-10-CM | POA: Diagnosis not present

## 2022-10-01 DIAGNOSIS — E782 Mixed hyperlipidemia: Secondary | ICD-10-CM | POA: Diagnosis not present

## 2022-10-01 DIAGNOSIS — E559 Vitamin D deficiency, unspecified: Secondary | ICD-10-CM | POA: Diagnosis not present

## 2022-10-30 ENCOUNTER — Encounter: Payer: Self-pay | Admitting: Gastroenterology

## 2022-12-24 ENCOUNTER — Ambulatory Visit: Payer: BC Managed Care – PPO | Admitting: Emergency Medicine

## 2022-12-24 ENCOUNTER — Encounter: Payer: Self-pay | Admitting: Emergency Medicine

## 2022-12-24 VITALS — BP 144/96 | HR 68 | Temp 98.0°F | Ht 63.0 in | Wt 172.4 lb

## 2022-12-24 DIAGNOSIS — R053 Chronic cough: Secondary | ICD-10-CM | POA: Diagnosis not present

## 2022-12-24 DIAGNOSIS — Z720 Tobacco use: Secondary | ICD-10-CM | POA: Diagnosis not present

## 2022-12-24 DIAGNOSIS — R059 Cough, unspecified: Secondary | ICD-10-CM | POA: Insufficient documentation

## 2022-12-24 NOTE — Assessment & Plan Note (Signed)
She is interested in decreasing her cigarettes, ultimately stopping.  I support this goal and we talked about cessation strategies.  She will work on setting goals to cut down by our next visit.  She is also interested in hearing about lung cancer screening.  We talked about the pros and cons of this.  I am in support of lung cancer screening for her.  She may be getting a CT scan of her abdomen/chest at some point in the future.  I will hold off on ordering an LDCT until we determine whether this will be done.  I do believe she needs baseline pulmonary function testing to quantify her degree of obstruction.  She does not seem to have any functional limitations.  She does have cough.

## 2022-12-24 NOTE — Assessment & Plan Note (Signed)
Likely multifactorial.  She does have a history of GERD and active symptoms.  Also chronic rhinitis.  Finally she had what sounds like vocal cord polypectomy in the past.

## 2022-12-24 NOTE — Patient Instructions (Signed)
I support your goals to cut down your smoking and ultimately stop smoking.  Start by using strategies to decrease your daily number of cigarettes.  We can talk about your efforts and your success next time We discussed the pros and cons of lung cancer screening CT scan of the chest.  We will consider ordering a dedicated lung cancer screening CT chest at your next visit depending on whether you will already be having a CT scan ordered by another provider We will discuss the timing of dedicated lung function testing at your next office visit Follow with Dr. Lamonte Sakai in June, call sooner if you have any problems or new respiratory symptoms.

## 2022-12-24 NOTE — Progress Notes (Signed)
Subjective:    Patient ID: Alexandra Allen, female    DOB: 04-29-1961, 62 y.o.   MRN: [id number]  HPI 62 year old woman with history tobacco use (30 pack years), obesity, large B cell lymphoma, GERD, osteoarthritis and lower extremity neuropathy with chronic pain. She had VC polyp removed by ENT.  She follows with Dr. Alvy Bimler, is currently on observation.  She is referred today for hx tobacco, decreased breath sounds.  She reports that she is active, not limited by her breathing. She has a cough, sometimes productive. She does have GERD, didn't tolerate PPI. She has persistent allergic rhinitis. She is smoking 15 cig / day, wants to ultimately stop. She has cut down significantly before.   CT chest 08/16/2017 reviewed by me shows suspected right posterior thyroid nodule 1.9 cm.  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   GERD (gastroesophageal reflux disease)    takes DGL(over the counter meds)   History of bronchitis    Hoarseness    Hoarseness of voice 08/03/2015   Joint pain    Joint swelling    Lightheaded    occasionally    Myalgia    Skin infection 01/23/2015   back   Thyroid disease    multiple nodules   Urinary frequency    Vocal cord edema      Family History  Problem Relation Age of Onset   Breast cancer Mother        breast and then died of endometrial cancer   Hypertension Mother    Endometrial cancer Mother    Colon cancer Father 73   Cancer Father        colon, skin and prostate   Hypertension Father    Heart disease Father    Diabetes Sister        borderline   Hypertension Sister    Colon cancer Maternal Grandfather    Esophageal cancer Neg Hx    Stomach cancer Neg Hx    Rectal cancer Neg Hx       Social History   Socioeconomic History   Marital status: Single    Spouse name: Not on file   Number of children: Not on file   Years of education: Not on file   Highest education level: Not on file  Occupational History   Not on file  Tobacco Use   Smoking status: Every Day    Packs/day: 0.75    Years: 35.00    Total pack years: 26.25    Types: Cigarettes   Smokeless tobacco: Never  Vaping Use   Vaping Use: Never used  Substance and Sexual Activity   Alcohol use: Yes    Alcohol/week: 5.0 standard drinks of alcohol    Types: 5 Glasses of wine per week   Drug use: No   Sexual activity: Yes  Other Topics Concern   Not on file  Social History Narrative   Not on file   Social Determinants of Health   Financial Resource Strain: Not on file  Food Insecurity: Not on file  Transportation Needs: Not on file  Physical Activity: Not on file  Stress: Not on file  Social Connections: Not on file  Intimate Partner Violence:  Not on file    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     Cholecalciferol (VITAMIN D) 50 MCG (2000 UT) CAPS 1 capsule     ibuprofen (ADVIL) 200 MG tablet Take 200 mg by mouth every 6 (six) hours as needed.     Multiple Vitamins-Minerals (MULTIVITAMIN PO) Take by mouth daily.     NON FORMULARY CBD oil po     Omega-3 Fatty Acids (FISH OIL PO) Take by mouth.     Probiotic Product (PROBIOTIC DAILY PO) Take by mouth.     Turmeric (CURCUMIN 95) 500 MG CAPS as  directed Orally     pantoprazole (PROTONIX) 40 MG tablet Take 1 tablet (40 mg total) by mouth daily. 90 tablet 3   No facility-administered medications prior to visit.         Objective:   Physical Exam Vitals:   12/24/22 1549  BP: (!) 144/96  Pulse: 68  Temp: 98 F (36.7 C)  TempSrc: Oral  SpO2: 100%  Weight: 172 lb 6.4 oz (78.2 kg)  Height: '5\' 3"'$  (1.6 m)   Gen: Pleasant, well-nourished, in no distress,  normal affect  ENT: No lesions,  mouth clear,  oropharynx clear, no postnasal drip  Neck: No JVD, no stridor  Lungs: No use of accessory muscles, no crackles or wheezing on normal respiration, no wheeze on forced expiration  Cardiovascular: RRR, heart sounds normal, no murmur or gallops, no peripheral edema  Musculoskeletal: No deformities, no cyanosis or clubbing  Neuro: alert, awake, non focal  Skin: Warm, no lesions or rash      Assessment & Plan:  Tobacco abuse She is interested in decreasing her cigarettes, ultimately stopping.  I support this goal and we talked about cessation strategies.  She will work on setting goals to cut down by our next visit.  She is also interested in hearing about lung cancer screening.  We talked about the pros and cons of this.  I am in support of lung cancer screening for her.  She may be getting a CT scan of her abdomen/chest at some point in the future.  I will hold off on ordering an LDCT until we determine whether this will be done.  I do believe she needs baseline pulmonary function testing to quantify her degree of obstruction.  She does not seem to have any functional limitations.  She does have cough.  Cough Likely multifactorial.  She does have a history of GERD and active symptoms.  Also chronic rhinitis.  Finally she had what sounds like vocal cord polypectomy in the past.   Baltazar Apo, MD, PhD 12/24/2022, 4:31 PM [facility] [phone] or if no answer before 7:00PM call [phone] For any  issues after 7:00PM please call eLink [phone]

## 2022-12-25 DIAGNOSIS — H5203 Hypermetropia, bilateral: Secondary | ICD-10-CM | POA: Diagnosis not present

## 2022-12-25 DIAGNOSIS — E119 Type 2 diabetes mellitus without complications: Secondary | ICD-10-CM | POA: Diagnosis not present

## 2022-12-25 DIAGNOSIS — H2513 Age-related nuclear cataract, bilateral: Secondary | ICD-10-CM | POA: Diagnosis not present

## 2023-01-15 DIAGNOSIS — G8929 Other chronic pain: Secondary | ICD-10-CM | POA: Diagnosis not present

## 2023-01-15 DIAGNOSIS — Z5181 Encounter for therapeutic drug level monitoring: Secondary | ICD-10-CM | POA: Diagnosis not present

## 2023-02-27 DIAGNOSIS — L910 Hypertrophic scar: Secondary | ICD-10-CM | POA: Diagnosis not present

## 2023-02-27 DIAGNOSIS — L821 Other seborrheic keratosis: Secondary | ICD-10-CM | POA: Diagnosis not present

## 2023-02-27 DIAGNOSIS — L820 Inflamed seborrheic keratosis: Secondary | ICD-10-CM | POA: Diagnosis not present

## 2023-02-27 DIAGNOSIS — L814 Other melanin hyperpigmentation: Secondary | ICD-10-CM | POA: Diagnosis not present

## 2023-02-27 DIAGNOSIS — L538 Other specified erythematous conditions: Secondary | ICD-10-CM | POA: Diagnosis not present

## 2023-02-27 DIAGNOSIS — Z08 Encounter for follow-up examination after completed treatment for malignant neoplasm: Secondary | ICD-10-CM | POA: Diagnosis not present

## 2023-02-27 DIAGNOSIS — L570 Actinic keratosis: Secondary | ICD-10-CM | POA: Diagnosis not present

## 2023-02-27 DIAGNOSIS — D225 Melanocytic nevi of trunk: Secondary | ICD-10-CM | POA: Diagnosis not present

## 2023-03-11 ENCOUNTER — Encounter: Payer: Self-pay | Admitting: Hematology and Oncology

## 2023-03-11 ENCOUNTER — Inpatient Hospital Stay: Payer: BC Managed Care – PPO | Attending: Hematology and Oncology | Admitting: Hematology and Oncology

## 2023-03-11 VITALS — BP 122/83 | HR 78 | Temp 98.9°F | Resp 18 | Ht 63.0 in | Wt 172.4 lb

## 2023-03-11 DIAGNOSIS — Z923 Personal history of irradiation: Secondary | ICD-10-CM | POA: Insufficient documentation

## 2023-03-11 DIAGNOSIS — Z8572 Personal history of non-Hodgkin lymphomas: Secondary | ICD-10-CM | POA: Diagnosis not present

## 2023-03-11 DIAGNOSIS — F17210 Nicotine dependence, cigarettes, uncomplicated: Secondary | ICD-10-CM | POA: Insufficient documentation

## 2023-03-11 DIAGNOSIS — Z720 Tobacco use: Secondary | ICD-10-CM

## 2023-03-11 DIAGNOSIS — Z9221 Personal history of antineoplastic chemotherapy: Secondary | ICD-10-CM | POA: Diagnosis not present

## 2023-03-11 NOTE — Assessment & Plan Note (Signed)
I spent some time counseling the patient the importance of tobacco cessation. She is currently interested to quit on her own  

## 2023-03-11 NOTE — Progress Notes (Signed)
[facility] OFFICE PROGRESS NOTE  Patient Care Team: Farris Has, MD as PCP - General (Family Medicine) Almond Lint, MD as Consulting Physician (General Surgery) Serena Colonel, MD as Consulting Physician (Otolaryngology)  ASSESSMENT & PLAN:  History of B-cell lymphoma Clinically, she have no signs of disease recurrence She is a long-term cancer survivor and is likely cured She does not need long-term follow-up We will discuss signs and symptoms to watch out for  Tobacco abuse I spent some time counseling the patient the importance of tobacco cessation. She is currently interested to quit on her own   No orders of the defined types were placed in this encounter.   All questions were answered. The patient knows to call the clinic with any problems, questions or concerns. The total time spent in the appointment was 20 minutes encounter with patients including review of chart and various tests results, discussions about plan of care and coordination of care plan   Artis Delay, MD 03/11/2023 12:15 PM  INTERVAL HISTORY: Please see below for problem oriented charting. she returns for surveillance follow-up She had history of lymphoma She denies new lymphadenopathy She is attempting to quit smoking, down to 10 cigarettes/day She recently saw dermatologist and was treated for seborrheic keratosis.  She is concerned about changes near her neck  REVIEW OF SYSTEMS:   Constitutional: Denies fevers, chills or abnormal weight loss Eyes: Denies blurriness of vision Ears, nose, mouth, throat, and face: Denies mucositis or sore throat Respiratory: Denies cough, dyspnea or wheezes Cardiovascular: Denies palpitation, chest discomfort or lower extremity swelling Gastrointestinal:  Denies nausea, heartburn or change in bowel habits Lymphatics: Denies new lymphadenopathy or easy bruising Neurological:Denies numbness, tingling or new weaknesses Behavioral/Psych: Mood is stable,  no new changes  All other systems were reviewed with the patient and are negative.  I have reviewed the past medical history, past surgical history, social history and family history with the patient and they are unchanged from previous note.  ALLERGIES:  is allergic to adhesive [tape], influenza vaccines, omeprazole-sodium bicarbonate, prednisone, latex, and sulfa antibiotics.  MEDICATIONS:  Current Outpatient Medications  Medication Sig Dispense Refill   Magnesium 250 MG TABS Take by mouth.     benzonatate (TESSALON) 200 MG capsule Take by mouth.     Cholecalciferol (VITAMIN D) 50 MCG (2000 UT) CAPS 1 capsule     ibuprofen (ADVIL) 200 MG tablet Take 200 mg by mouth every 6 (six) hours as needed.     Multiple Vitamins-Minerals (MULTIVITAMIN PO) Take by mouth daily.     NON FORMULARY CBD oil po     Omega-3 Fatty Acids (FISH OIL PO) Take by mouth.     Probiotic Product (PROBIOTIC DAILY PO) Take by mouth.     Turmeric (CURCUMIN 95) 500 MG CAPS as directed Orally     No current facility-administered medications for this visit.    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   11/23/2014 Bone Marrow Biopsy   Accession: [id number] is negative for lymphoma   11/30/2014 - 01/11/2015 Chemotherapy   She received chemotherapy with RCHOP X 3 cycles   11/30/2014 Adverse Reaction   Treatment was complicated by mild hypersensitivity reaction to rituximab   01/30/2015 Imaging   Repeat PET CT scan showed complete response to the lymphadenopathy.   02/23/2015 - 03/22/2015 Radiation Therapy   She received radiation therapy 36 Gy in 20 fractions to left groin area   08/01/2015 Imaging   PET scan showed complete response to Rx   08/13/2016 Imaging   Ct scan showed no adenopathy identified within the abdomen or pelvis.   08/20/2017 Imaging   1. No findings of active lymphoma. 2. Hepatic hemangiomas and bilateral adrenal adenomas. 3. Right posterior thyroid lobe nodule was not previously hypermetabolic and accordingly is probably benign. 4. Calcified granuloma in the left upper lobe. No worrisome pulmonary nodule. 5.  Aortic Atherosclerosis (ICD10-I70.0). 6. Mild lumbar scoliosis and degenerative disc disease. 7. Diffuse hepatic steatosis.     PHYSICAL EXAMINATION: ECOG PERFORMANCE STATUS: 0 - Asymptomatic  Vitals:   03/11/23 1015  BP: 122/83  Pulse: 78  Resp: 18  Temp: 98.9 F (37.2 C)  SpO2: 100%   Filed Weights   03/11/23 1015  Weight: 172 lb 6.4 oz (78.2 kg)    GENERAL:alert, no distress and comfortable SKIN: Her recent skin changes is consistent with recent treatment effect. EYES: normal, Conjunctiva are pink and non-injected, sclera clear OROPHARYNX:no exudate, no erythema and lips, buccal mucosa, and tongue normal  NECK: supple, thyroid normal size, non-tender, without nodularity LYMPH:  no palpable lymphadenopathy in the cervical, axillary or inguinal LUNGS: clear to auscultation and percussion with normal breathing effort HEART: regular rate & rhythm and no murmurs and no lower extremity  edema ABDOMEN:abdomen soft, non-tender and normal bowel sounds Musculoskeletal:no cyanosis of digits and no clubbing  NEURO: alert & oriented x 3 with fluent speech, no focal motor/sensory deficits  LABORATORY DATA:  I have reviewed the data as listed    Component Value Date/Time   NA 142 03/23/2020 0950   NA 141 08/14/2017 0931   K 4.3 03/23/2020 0950   K 4.3 08/14/2017 0931   CL 108 03/23/2020 0950   CO2 27 03/23/2020 0950   CO2 26 08/14/2017 0931   GLUCOSE 107 (H) 03/23/2020 0950   GLUCOSE 100 08/14/2017 0931   BUN 12 03/23/2020 0950   BUN 13.0 08/14/2017 0931   CREATININE 0.75 03/23/2020 0950   CREATININE 0.8 08/14/2017 0931   CALCIUM 9.5 03/23/2020 0950   CALCIUM 10.1 08/14/2017 0931   PROT 6.8 03/23/2020 0950   PROT 7.6 08/14/2017 0931   ALBUMIN 3.7 03/23/2020 0950   ALBUMIN 4.0 08/14/2017 0931   AST 26 03/23/2020 0950   AST 24 08/14/2017 0931   ALT 42 03/23/2020 0950   ALT 29 08/14/2017 0931   ALKPHOS 45 03/23/2020 0950   ALKPHOS 49 08/14/2017 0931   BILITOT 0.3 03/23/2020 0950   BILITOT 0.38 08/14/2017 0931   GFRNONAA >60 03/23/2020 0950   GFRAA >60 03/23/2020 0950    No results found for: "SPEP", "UPEP"  Lab Results  Component Value Date   WBC 5.7 03/23/2020   NEUTROABS 3.5 03/23/2020   HGB 14.1 03/23/2020   HCT 42.1 03/23/2020   MCV 98.4 03/23/2020   PLT 262 03/23/2020      Chemistry      Component Value Date/Time   NA 142 03/23/2020 0950   NA 141 08/14/2017 0931   K 4.3 03/23/2020 0950   K 4.3 08/14/2017 0931   CL 108 03/23/2020 0950   CO2 27 03/23/2020 0950   CO2 26 08/14/2017 0931   BUN 12 03/23/2020 0950   BUN 13.0 08/14/2017 0931   CREATININE 0.75 03/23/2020 0950   CREATININE 0.8 08/14/2017 0931      Component Value Date/Time   CALCIUM 9.5 03/23/2020 0950   CALCIUM 10.1 08/14/2017 0931   ALKPHOS 45 03/23/2020 0950   ALKPHOS 49 08/14/2017 0931   AST 26 03/23/2020 0950   AST 24 08/14/2017 0931   ALT 42 03/23/2020 0950   ALT 29  08/14/2017 0931   BILITOT 0.3 03/23/2020 0950   BILITOT 0.38 08/14/2017 0931

## 2023-03-11 NOTE — Assessment & Plan Note (Signed)
Clinically, she have no signs of disease recurrence She is a long-term cancer survivor and is likely cured She does not need long-term follow-up We will discuss signs and symptoms to watch out for

## 2023-04-08 DIAGNOSIS — D3500 Benign neoplasm of unspecified adrenal gland: Secondary | ICD-10-CM | POA: Diagnosis not present

## 2023-04-08 DIAGNOSIS — E1169 Type 2 diabetes mellitus with other specified complication: Secondary | ICD-10-CM | POA: Diagnosis not present

## 2023-04-08 DIAGNOSIS — E049 Nontoxic goiter, unspecified: Secondary | ICD-10-CM | POA: Diagnosis not present

## 2023-04-10 DIAGNOSIS — L814 Other melanin hyperpigmentation: Secondary | ICD-10-CM | POA: Diagnosis not present

## 2023-04-10 DIAGNOSIS — L821 Other seborrheic keratosis: Secondary | ICD-10-CM | POA: Diagnosis not present

## 2023-04-10 DIAGNOSIS — L910 Hypertrophic scar: Secondary | ICD-10-CM | POA: Diagnosis not present

## 2023-04-10 DIAGNOSIS — L570 Actinic keratosis: Secondary | ICD-10-CM | POA: Diagnosis not present

## 2023-04-11 ENCOUNTER — Other Ambulatory Visit: Payer: Self-pay | Admitting: Family Medicine

## 2023-04-11 DIAGNOSIS — Z1231 Encounter for screening mammogram for malignant neoplasm of breast: Secondary | ICD-10-CM

## 2023-04-15 ENCOUNTER — Other Ambulatory Visit: Payer: Self-pay | Admitting: Endocrinology

## 2023-04-15 DIAGNOSIS — E049 Nontoxic goiter, unspecified: Secondary | ICD-10-CM | POA: Diagnosis not present

## 2023-04-15 DIAGNOSIS — E249 Cushing's syndrome, unspecified: Secondary | ICD-10-CM | POA: Diagnosis not present

## 2023-04-15 DIAGNOSIS — E1169 Type 2 diabetes mellitus with other specified complication: Secondary | ICD-10-CM | POA: Diagnosis not present

## 2023-04-15 DIAGNOSIS — D3500 Benign neoplasm of unspecified adrenal gland: Secondary | ICD-10-CM | POA: Diagnosis not present

## 2023-04-21 DIAGNOSIS — G894 Chronic pain syndrome: Secondary | ICD-10-CM | POA: Diagnosis not present

## 2023-04-21 DIAGNOSIS — J069 Acute upper respiratory infection, unspecified: Secondary | ICD-10-CM | POA: Diagnosis not present

## 2023-05-08 ENCOUNTER — Ambulatory Visit
Admission: RE | Admit: 2023-05-08 | Discharge: 2023-05-08 | Disposition: A | Discharge status: Home / Self Care | Payer: BC Managed Care – PPO | Source: Ambulatory Visit | Attending: Family Medicine | Admitting: Family Medicine

## 2023-05-08 DIAGNOSIS — Z1231 Encounter for screening mammogram for malignant neoplasm of breast: Secondary | ICD-10-CM | POA: Diagnosis not present

## 2023-05-22 ENCOUNTER — Telehealth: Payer: Self-pay | Admitting: Emergency Medicine

## 2023-05-22 DIAGNOSIS — Z720 Tobacco use: Secondary | ICD-10-CM

## 2023-05-22 NOTE — Telephone Encounter (Signed)
On last AVS Dr. Leonard Schwartz wrote:  We will consider ordering a dedicated lung cancer screening CT chest at your next visit depending on whether you will already be having a CT scan ordered by another provider We will discuss the timing of dedicated lung function testing at your next office visit  Pt calling thinking he was going to sched these before next appt. She says she wants to have these done before her next appt. Please call PT to advise if Dr. Stann Mainland put in orders for these.   Please [phone]

## 2023-05-26 ENCOUNTER — Ambulatory Visit: Admission: RE | Admit: 2023-05-26 | Payer: BC Managed Care – PPO | Source: Ambulatory Visit

## 2023-05-26 DIAGNOSIS — E042 Nontoxic multinodular goiter: Secondary | ICD-10-CM | POA: Diagnosis not present

## 2023-05-26 DIAGNOSIS — E049 Nontoxic goiter, unspecified: Secondary | ICD-10-CM

## 2023-05-26 NOTE — Telephone Encounter (Signed)
I called and left Alexandra Allen message that I will make the screening program referral, and I will also order PFT. Thanks

## 2023-05-27 ENCOUNTER — Other Ambulatory Visit: Payer: Self-pay | Admitting: *Deleted

## 2023-05-27 DIAGNOSIS — F17210 Nicotine dependence, cigarettes, uncomplicated: Secondary | ICD-10-CM

## 2023-05-27 DIAGNOSIS — Z122 Encounter for screening for malignant neoplasm of respiratory organs: Secondary | ICD-10-CM

## 2023-05-27 DIAGNOSIS — Z87891 Personal history of nicotine dependence: Secondary | ICD-10-CM

## 2023-06-26 ENCOUNTER — Ambulatory Visit (INDEPENDENT_AMBULATORY_CARE_PROVIDER_SITE_OTHER): Payer: BC Managed Care – PPO | Admitting: Physician Assistant

## 2023-06-26 ENCOUNTER — Encounter: Payer: Self-pay | Admitting: Physician Assistant

## 2023-06-26 DIAGNOSIS — F17210 Nicotine dependence, cigarettes, uncomplicated: Secondary | ICD-10-CM | POA: Diagnosis not present

## 2023-06-26 NOTE — Progress Notes (Signed)
Virtual Visit via Telephone Note  I connected with Homero Fellers on 06/26/23 at  1030 by telephone and verified that I am speaking with the correct person using two identifiers.  Location: Patient: home Provider: working virtually from home   I discussed the limitations, risks, security and privacy concerns of performing an evaluation and management service by telephone and the availability of in person appointments. I also discussed with the patient that there may be a patient responsible charge related to this service. The patient expressed understanding and agreed to proceed.     Shared Decision Making Visit Lung Cancer Screening Program [phone])   Eligibility: Age 30 Pack Years Smoking History Calculation 23 (# packs/per year x # years smoked) Recent History of coughing up blood  No Unexplained weight loss? No ( >Than 15 pounds within the last 6 months ) Prior History Lung / other cancer No (Diagnosis within the last 5 years already requiring surveillance chest CT Scans). Smoking Status Current Smoker  Visit Components: Discussion included one or more decision making aids. Yes Discussion included risk/benefits of screening. Yes Discussion included potential follow up diagnostic testing for abnormal scans. Yes Discussion included meaning and risk of over diagnosis. Yes Discussion included meaning and risk of False Positives. Yes Discussion included meaning of total radiation exposure. Yes  Counseling Included: Importance of adherence to annual lung cancer LDCT screening. Yes Impact of comorbidities on ability to participate in the program. Yes Ability and willingness to under diagnostic treatment: Yes  Smoking Cessation Counseling: Current Smokers:  Discussed importance of smoking cessation. Yes Information about tobacco cessation classes and interventions provided to patient. Yes Symptomatic Patient. No Diagnosis Code: Tobacco Use Z72.0 Asymptomatic Patient  Yes  Counseling (Intermediate counseling: > three minutes counseling) U0454 Information about tobacco cessation classes and interventions provided to patient. Yes Written Order for Lung Cancer Screening with LDCT placed in Epic. Yes (CT Chest Lung Cancer Screening Low Dose W/O CM) [id number] Z12.2-Screening of respiratory organs Z87.891-Personal history of nicotine dependence   I have spent 25 minutes of face to face/ virtual visit  time with the patient discussing the risks and benefits of lung cancer screening. We discussed the above noted topics. We paused at intervals to allow for questions to be asked and answered to ensure understanding.We discussed that the single most powerful action that anyone can take to decrease their risk of developing lung cancer is to quit smoking.  We discussed options for tools to aid in quitting smoking including nicotine replacement therapy, non-nicotine medications, support groups, Quit Smart classes, and behavior modification. We discussed that often times setting smaller, more achievable goals, such as eliminating 1 cigarette a day for a week and then 2 cigarettes a day for a week can be helpful in slowly decreasing the number of cigarettes smoked. I provided  them  with smoking cessation  information  with contact information for community resources, classes, free nicotine replacement therapy, and access to mobile apps, text messaging, and on-line smoking cessation help. I have also provided  them  the office contact information in the event they have any questions. We discussed the time and location of the scan, and that either Abigail Miyamoto RN, Karlton Lemon, RN  or I will call / send a letter with the results within 24-72 hours of receiving them. The patient verbalized understanding of all of  the above and had no further questions upon leaving the office. They have my contact information in the event they have any further questions.  I spent 2 minutes counseling  on smoking cessation and the health risks of continued tobacco abuse.  I explained to the patient that there has been a high incidence of coronary artery disease noted on these exams. I explained that this is a non-gated exam therefore degree or severity cannot be determined. This patient is not on statin therapy. I have asked the patient to follow-up with their PCP regarding any incidental finding of coronary artery disease and management with diet or medication as their PCP  feels is clinically indicated. The patient verbalized understanding of the above and had no further questions upon completion of the visit.    Darcella Gasman Santana Edell, PA-C

## 2023-06-26 NOTE — Patient Instructions (Signed)

## 2023-06-27 ENCOUNTER — Ambulatory Visit
Admission: RE | Admit: 2023-06-27 | Discharge: 2023-06-27 | Disposition: A | Discharge status: Home / Self Care | Payer: BC Managed Care – PPO | Source: Ambulatory Visit | Attending: Acute Care | Admitting: Acute Care

## 2023-06-27 DIAGNOSIS — F17210 Nicotine dependence, cigarettes, uncomplicated: Secondary | ICD-10-CM | POA: Diagnosis not present

## 2023-06-27 DIAGNOSIS — Z122 Encounter for screening for malignant neoplasm of respiratory organs: Secondary | ICD-10-CM

## 2023-06-27 DIAGNOSIS — Z87891 Personal history of nicotine dependence: Secondary | ICD-10-CM

## 2023-07-07 ENCOUNTER — Other Ambulatory Visit: Payer: Self-pay | Admitting: Acute Care

## 2023-07-07 DIAGNOSIS — Z87891 Personal history of nicotine dependence: Secondary | ICD-10-CM

## 2023-07-07 DIAGNOSIS — Z122 Encounter for screening for malignant neoplasm of respiratory organs: Secondary | ICD-10-CM

## 2023-07-07 DIAGNOSIS — F17210 Nicotine dependence, cigarettes, uncomplicated: Secondary | ICD-10-CM

## 2023-07-19 DIAGNOSIS — X58XXXA Exposure to other specified factors, initial encounter: Secondary | ICD-10-CM | POA: Diagnosis not present

## 2023-07-19 DIAGNOSIS — S91209A Unspecified open wound of unspecified toe(s) with damage to nail, initial encounter: Secondary | ICD-10-CM | POA: Diagnosis not present

## 2023-07-19 DIAGNOSIS — S91205A Unspecified open wound of left lesser toe(s) with damage to nail, initial encounter: Secondary | ICD-10-CM | POA: Diagnosis not present

## 2023-07-19 DIAGNOSIS — Z23 Encounter for immunization: Secondary | ICD-10-CM | POA: Diagnosis not present

## 2023-08-20 DIAGNOSIS — G8929 Other chronic pain: Secondary | ICD-10-CM | POA: Diagnosis not present

## 2023-08-20 DIAGNOSIS — M2550 Pain in unspecified joint: Secondary | ICD-10-CM | POA: Diagnosis not present

## 2023-08-20 DIAGNOSIS — F17200 Nicotine dependence, unspecified, uncomplicated: Secondary | ICD-10-CM | POA: Diagnosis not present

## 2023-08-20 DIAGNOSIS — Z79899 Other long term (current) drug therapy: Secondary | ICD-10-CM | POA: Diagnosis not present

## 2023-08-21 DIAGNOSIS — M7631 Iliotibial band syndrome, right leg: Secondary | ICD-10-CM | POA: Diagnosis not present

## 2023-08-21 DIAGNOSIS — M79645 Pain in left finger(s): Secondary | ICD-10-CM | POA: Diagnosis not present

## 2023-08-21 DIAGNOSIS — M1991 Primary osteoarthritis, unspecified site: Secondary | ICD-10-CM | POA: Diagnosis not present

## 2023-08-21 DIAGNOSIS — G8929 Other chronic pain: Secondary | ICD-10-CM | POA: Diagnosis not present

## 2023-10-17 DIAGNOSIS — Z0000 Encounter for general adult medical examination without abnormal findings: Secondary | ICD-10-CM | POA: Diagnosis not present

## 2023-10-17 DIAGNOSIS — E782 Mixed hyperlipidemia: Secondary | ICD-10-CM | POA: Diagnosis not present

## 2023-10-17 DIAGNOSIS — E249 Cushing's syndrome, unspecified: Secondary | ICD-10-CM | POA: Diagnosis not present

## 2023-10-17 DIAGNOSIS — E119 Type 2 diabetes mellitus without complications: Secondary | ICD-10-CM | POA: Diagnosis not present

## 2023-10-17 DIAGNOSIS — F17200 Nicotine dependence, unspecified, uncomplicated: Secondary | ICD-10-CM | POA: Diagnosis not present

## 2023-10-17 DIAGNOSIS — C8330 Diffuse large B-cell lymphoma, unspecified site: Secondary | ICD-10-CM | POA: Diagnosis not present

## 2023-11-27 ENCOUNTER — Ambulatory Visit: Payer: BC Managed Care – PPO | Admitting: Emergency Medicine

## 2024-02-17 DIAGNOSIS — H52203 Unspecified astigmatism, bilateral: Secondary | ICD-10-CM | POA: Diagnosis not present

## 2024-02-17 DIAGNOSIS — E119 Type 2 diabetes mellitus without complications: Secondary | ICD-10-CM | POA: Diagnosis not present

## 2024-02-17 DIAGNOSIS — H2513 Age-related nuclear cataract, bilateral: Secondary | ICD-10-CM | POA: Diagnosis not present

## 2024-02-18 ENCOUNTER — Ambulatory Visit: Payer: BC Managed Care – PPO | Admitting: Emergency Medicine

## 2024-04-16 ENCOUNTER — Other Ambulatory Visit: Payer: Self-pay | Admitting: Family Medicine

## 2024-04-16 DIAGNOSIS — Z1231 Encounter for screening mammogram for malignant neoplasm of breast: Secondary | ICD-10-CM

## 2024-04-26 ENCOUNTER — Encounter

## 2024-04-29 DIAGNOSIS — E1169 Type 2 diabetes mellitus with other specified complication: Secondary | ICD-10-CM | POA: Diagnosis not present

## 2024-04-29 DIAGNOSIS — E049 Nontoxic goiter, unspecified: Secondary | ICD-10-CM | POA: Diagnosis not present

## 2024-04-29 DIAGNOSIS — D3500 Benign neoplasm of unspecified adrenal gland: Secondary | ICD-10-CM | POA: Diagnosis not present

## 2024-05-05 ENCOUNTER — Other Ambulatory Visit: Payer: Self-pay | Admitting: Endocrinology

## 2024-05-05 DIAGNOSIS — E249 Cushing's syndrome, unspecified: Secondary | ICD-10-CM | POA: Diagnosis not present

## 2024-05-05 DIAGNOSIS — E1169 Type 2 diabetes mellitus with other specified complication: Secondary | ICD-10-CM | POA: Diagnosis not present

## 2024-05-05 DIAGNOSIS — E049 Nontoxic goiter, unspecified: Secondary | ICD-10-CM

## 2024-05-05 DIAGNOSIS — D3500 Benign neoplasm of unspecified adrenal gland: Secondary | ICD-10-CM | POA: Diagnosis not present

## 2024-05-10 ENCOUNTER — Other Ambulatory Visit: Payer: Self-pay | Admitting: Endocrinology

## 2024-05-10 DIAGNOSIS — E249 Cushing's syndrome, unspecified: Secondary | ICD-10-CM

## 2024-05-11 ENCOUNTER — Ambulatory Visit
Admission: RE | Admit: 2024-05-11 | Discharge: 2024-05-11 | Disposition: A | Discharge status: Home / Self Care | Source: Ambulatory Visit | Attending: Family Medicine | Admitting: Family Medicine

## 2024-05-11 DIAGNOSIS — Z1231 Encounter for screening mammogram for malignant neoplasm of breast: Secondary | ICD-10-CM | POA: Diagnosis not present

## 2024-05-17 ENCOUNTER — Telehealth: Payer: Self-pay | Admitting: Emergency Medicine

## 2024-05-17 NOTE — Telephone Encounter (Signed)
 Called patient to schedule a PFT but she states that she is due for a lung cancer screening. Please call and advise [phone].

## 2024-05-17 NOTE — Telephone Encounter (Signed)
 Lmam for patient

## 2024-05-18 ENCOUNTER — Inpatient Hospital Stay
Admission: RE | Admit: 2024-05-18 | Discharge: 2024-05-18 | Discharge status: Home / Self Care | Source: Ambulatory Visit | Attending: Endocrinology | Admitting: Endocrinology

## 2024-05-18 DIAGNOSIS — E049 Nontoxic goiter, unspecified: Secondary | ICD-10-CM

## 2024-05-18 DIAGNOSIS — E042 Nontoxic multinodular goiter: Secondary | ICD-10-CM | POA: Diagnosis not present

## 2024-05-18 NOTE — Telephone Encounter (Signed)
 Patient called back and we scheduled her LDCT

## 2024-05-25 DIAGNOSIS — F17200 Nicotine dependence, unspecified, uncomplicated: Secondary | ICD-10-CM | POA: Diagnosis not present

## 2024-05-25 DIAGNOSIS — M2550 Pain in unspecified joint: Secondary | ICD-10-CM | POA: Diagnosis not present

## 2024-05-25 DIAGNOSIS — G8929 Other chronic pain: Secondary | ICD-10-CM | POA: Diagnosis not present

## 2024-05-28 ENCOUNTER — Encounter: Payer: Self-pay | Admitting: Endocrinology

## 2024-05-31 ENCOUNTER — Encounter: Payer: Self-pay | Admitting: Endocrinology

## 2024-06-03 ENCOUNTER — Ambulatory Visit
Admission: RE | Admit: 2024-06-03 | Discharge: 2024-06-03 | Disposition: A | Discharge status: Home / Self Care | Source: Ambulatory Visit | Attending: Endocrinology | Admitting: Endocrinology

## 2024-06-03 DIAGNOSIS — K760 Fatty (change of) liver, not elsewhere classified: Secondary | ICD-10-CM | POA: Diagnosis not present

## 2024-06-03 DIAGNOSIS — E249 Cushing's syndrome, unspecified: Secondary | ICD-10-CM

## 2024-06-03 DIAGNOSIS — E279 Disorder of adrenal gland, unspecified: Secondary | ICD-10-CM | POA: Diagnosis not present

## 2024-06-25 DIAGNOSIS — S0501XA Injury of conjunctiva and corneal abrasion without foreign body, right eye, initial encounter: Secondary | ICD-10-CM | POA: Diagnosis not present

## 2024-06-25 DIAGNOSIS — H18891 Other specified disorders of cornea, right eye: Secondary | ICD-10-CM | POA: Diagnosis not present

## 2024-06-25 DIAGNOSIS — T1501XA Foreign body in cornea, right eye, initial encounter: Secondary | ICD-10-CM | POA: Diagnosis not present

## 2024-06-29 ENCOUNTER — Ambulatory Visit
Admission: RE | Admit: 2024-06-29 | Discharge: 2024-06-29 | Disposition: A | Discharge status: Home / Self Care | Source: Ambulatory Visit | Attending: Acute Care | Admitting: Acute Care

## 2024-06-29 DIAGNOSIS — Z122 Encounter for screening for malignant neoplasm of respiratory organs: Secondary | ICD-10-CM

## 2024-06-29 DIAGNOSIS — F17210 Nicotine dependence, cigarettes, uncomplicated: Secondary | ICD-10-CM

## 2024-06-29 DIAGNOSIS — Z87891 Personal history of nicotine dependence: Secondary | ICD-10-CM

## 2024-06-30 DIAGNOSIS — S0502XD Injury of conjunctiva and corneal abrasion without foreign body, left eye, subsequent encounter: Secondary | ICD-10-CM | POA: Diagnosis not present

## 2024-07-07 ENCOUNTER — Other Ambulatory Visit: Payer: Self-pay

## 2024-07-07 DIAGNOSIS — Z87891 Personal history of nicotine dependence: Secondary | ICD-10-CM

## 2024-07-07 DIAGNOSIS — F17210 Nicotine dependence, cigarettes, uncomplicated: Secondary | ICD-10-CM

## 2024-07-07 DIAGNOSIS — Z122 Encounter for screening for malignant neoplasm of respiratory organs: Secondary | ICD-10-CM

## 2024-07-15 DIAGNOSIS — H6123 Impacted cerumen, bilateral: Secondary | ICD-10-CM | POA: Diagnosis not present

## 2024-07-15 DIAGNOSIS — K219 Gastro-esophageal reflux disease without esophagitis: Secondary | ICD-10-CM | POA: Diagnosis not present

## 2024-07-20 DIAGNOSIS — E249 Cushing's syndrome, unspecified: Secondary | ICD-10-CM | POA: Diagnosis not present

## 2024-07-20 DIAGNOSIS — E279 Disorder of adrenal gland, unspecified: Secondary | ICD-10-CM | POA: Diagnosis not present

## 2024-07-20 DIAGNOSIS — L689 Hypertrichosis, unspecified: Secondary | ICD-10-CM | POA: Diagnosis not present

## 2024-08-19 DIAGNOSIS — G8929 Other chronic pain: Secondary | ICD-10-CM | POA: Diagnosis not present

## 2024-08-19 DIAGNOSIS — M79645 Pain in left finger(s): Secondary | ICD-10-CM | POA: Diagnosis not present

## 2024-08-19 DIAGNOSIS — M79641 Pain in right hand: Secondary | ICD-10-CM | POA: Diagnosis not present

## 2024-08-19 DIAGNOSIS — M1991 Primary osteoarthritis, unspecified site: Secondary | ICD-10-CM | POA: Diagnosis not present

## 2024-08-19 DIAGNOSIS — M79642 Pain in left hand: Secondary | ICD-10-CM | POA: Diagnosis not present

## 2024-08-20 DIAGNOSIS — D485 Neoplasm of uncertain behavior of skin: Secondary | ICD-10-CM | POA: Diagnosis not present

## 2024-08-20 DIAGNOSIS — C44712 Basal cell carcinoma of skin of right lower limb, including hip: Secondary | ICD-10-CM | POA: Diagnosis not present

## 2024-08-20 DIAGNOSIS — L538 Other specified erythematous conditions: Secondary | ICD-10-CM | POA: Diagnosis not present

## 2024-08-20 DIAGNOSIS — L820 Inflamed seborrheic keratosis: Secondary | ICD-10-CM | POA: Diagnosis not present

## 2024-08-20 DIAGNOSIS — D1801 Hemangioma of skin and subcutaneous tissue: Secondary | ICD-10-CM | POA: Diagnosis not present

## 2024-08-20 DIAGNOSIS — B079 Viral wart, unspecified: Secondary | ICD-10-CM | POA: Diagnosis not present

## 2024-08-20 DIAGNOSIS — L918 Other hypertrophic disorders of the skin: Secondary | ICD-10-CM | POA: Diagnosis not present

## 2024-08-20 DIAGNOSIS — L814 Other melanin hyperpigmentation: Secondary | ICD-10-CM | POA: Diagnosis not present

## 2024-08-20 DIAGNOSIS — L821 Other seborrheic keratosis: Secondary | ICD-10-CM | POA: Diagnosis not present

## 2024-09-09 DIAGNOSIS — E279 Disorder of adrenal gland, unspecified: Secondary | ICD-10-CM | POA: Diagnosis not present

## 2024-09-09 DIAGNOSIS — L689 Hypertrichosis, unspecified: Secondary | ICD-10-CM | POA: Diagnosis not present

## 2024-09-13 DIAGNOSIS — L689 Hypertrichosis, unspecified: Secondary | ICD-10-CM | POA: Diagnosis not present

## 2024-09-13 DIAGNOSIS — E249 Cushing's syndrome, unspecified: Secondary | ICD-10-CM | POA: Diagnosis not present

## 2024-09-13 DIAGNOSIS — E279 Disorder of adrenal gland, unspecified: Secondary | ICD-10-CM | POA: Diagnosis not present

## 2024-09-15 DIAGNOSIS — C44712 Basal cell carcinoma of skin of right lower limb, including hip: Secondary | ICD-10-CM | POA: Diagnosis not present

## 2024-09-16 ENCOUNTER — Ambulatory Visit: Admitting: Emergency Medicine

## 2024-09-16 ENCOUNTER — Encounter: Payer: Self-pay | Admitting: Emergency Medicine

## 2024-09-16 VITALS — BP 128/70 | HR 75 | Temp 97.8°F | Ht 64.0 in | Wt 175.0 lb

## 2024-09-16 DIAGNOSIS — Z8572 Personal history of non-Hodgkin lymphomas: Secondary | ICD-10-CM | POA: Diagnosis not present

## 2024-09-16 DIAGNOSIS — J449 Chronic obstructive pulmonary disease, unspecified: Secondary | ICD-10-CM | POA: Diagnosis not present

## 2024-09-16 DIAGNOSIS — F17210 Nicotine dependence, cigarettes, uncomplicated: Secondary | ICD-10-CM | POA: Diagnosis not present

## 2024-09-16 DIAGNOSIS — Z720 Tobacco use: Secondary | ICD-10-CM

## 2024-09-16 NOTE — Progress Notes (Signed)
 Subjective:    Patient ID: Alexandra Allen, female    DOB: October 21, 1961, 63 y.o.   MRN: [id number]  HPI 63 year old woman with history tobacco use (30 pack years), obesity, large B cell lymphoma, GERD, osteoarthritis and lower extremity neuropathy with chronic pain. She had VC polyp removed by ENT.  She follows with Dr. Lonn, is currently on observation.  She is referred today for hx tobacco, decreased breath sounds.  She reports that she is active, not limited by her breathing. She has a cough, sometimes productive. She does have GERD, didn't tolerate PPI. She has persistent allergic rhinitis. She is smoking 15 cig / day, wants to ultimately stop. She has cut down significantly before.   CT chest 08/16/2017 reviewed by me shows suspected right posterior thyroid  nodule 1.9 cm.  No mediastinal adenopathy, calcified 6 x 3 mm left upper lobe pulmonary nodule  ROV 09/16/2024 --follow-up visit 63 year old woman with a history of active tobacco use (31 pack years), large B-cell lymphoma, obesity, GERD, osteoarthritis, lower extremity neuropathy, vocal cord polyp removed by ENT.  She is on observation and follows with oncology. She is not having any significant exertional SOB, she is able to exert, swim, do her yardwork. Minimal cough. She is moking about 10 cig a day.   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   GERD (gastroesophageal reflux disease)    takes DGL(over the counter meds)   History of bronchitis    Hoarseness    Hoarseness of voice 08/03/2015   Joint pain    Joint swelling    Lightheaded    occasionally    Myalgia    Skin infection 01/23/2015   back   Thyroid  disease    multiple nodules   Urinary frequency    Vocal cord edema      Family History  Problem Relation  Age of Onset   Breast cancer Mother        breast and then died of endometrial cancer   Hypertension Mother    Endometrial cancer Mother    Colon cancer Father 42   Cancer Father        colon, skin and prostate   Hypertension Father    Heart disease Father    Diabetes Sister        borderline   Hypertension Sister    Colon cancer Maternal Grandfather    Esophageal cancer Neg Hx    Stomach cancer Neg Hx    Rectal cancer Neg Hx      Social History   Socioeconomic History   Marital status: Single    Spouse name: Not on file   Number of children: Not on file   Years of education: Not on file   Highest education level: Not on file  Occupational History   Not on file  Tobacco Use   Smoking status: Every Day    Current packs/day: 0.75    Average packs/day: 0.8 packs/day for 35.0 years (26.3 ttl pk-yrs)    Types: Cigarettes   Smokeless tobacco: Never   Tobacco comments:    Pt smokes 10-12 cigarettes daily. AB, CMA 09-16-24  Vaping Use   Vaping status: Never Used  Substance and Sexual Activity   Alcohol use: Yes  Alcohol/week: 5.0 standard drinks of alcohol    Types: 5 Glasses of wine per week   Drug use: No   Sexual activity: Yes  Other Topics Concern   Not on file  Social History Narrative   Not on file   Social Drivers of Health   Financial Resource Strain: Patient Declined (07/17/2024)   Received from [facility]   Overall Financial Resource Strain (CARDIA)    Difficulty of Paying Living Expenses: Patient declined  Food Insecurity: Patient Declined (07/17/2024)   Received from [facility]   Hunger Vital Sign    Within the past 12 months, you worried that your food would run out before you got the money to buy more.: Patient declined    Within the past 12 months, the food you bought just didn't last and you didn't have money to get more.: Patient declined  Transportation Needs: Patient Declined (07/17/2024)   Received from  [facility] - Transportation    In the past 12 months, has lack of transportation kept you from medical appointments or from getting medications?: Patient declined    Lack of Transportation (Non-Medical): Patient declined  Physical Activity: Insufficiently Active (10/02/2021)   Received from [facility]   Exercise Vital Sign    On average, how many days per week do you engage in moderate to strenuous exercise (like a brisk walk)?: 3 days    On average, how many minutes do you engage in exercise at this level?: 30 min  Stress: No Stress Concern Present (10/02/2021)   Received from [facility] - Occupational Stress Questionnaire    Feeling of Stress : Only a little  Social Connections: Not on file  Intimate Partner Violence: Not on file    She may have a mold exposure at work [employer] TRA[employer] Has lived in [place], [place],    Allergies  Allergen Reactions   Adhesive [Tape]     rash   Influenza Vaccines Other (See Comments)    Joint pain and aches    Omeprazole-Sodium Bicarbonate     Other reaction(s): Unknown, elevated liver enzymes (not sure if med was the cause)   Prednisone  Other (See Comments)    Severe Headache    Latex Rash   Sulfa Antibiotics Rash     Outpatient Medications Prior to Visit  Medication Sig Dispense Refill   benzonatate (TESSALON) 200 MG capsule Take by mouth.     Cholecalciferol (VITAMIN D) 50 MCG (2000 UT) CAPS 1 capsule     ibuprofen (ADVIL) 200 MG tablet Take 200 mg by mouth every 6 (six) hours as needed.     Magnesium 250 MG TABS Take by mouth.     Multiple Vitamins-Minerals (MULTIVITAMIN PO) Take by mouth daily.     NON FORMULARY CBD oil po     Omega-3 Fatty Acids (FISH OIL PO) Take by mouth.     Probiotic Product (PROBIOTIC DAILY PO) Take by mouth.     Turmeric (CURCUMIN 95) 500 MG CAPS as directed Orally     No facility-administered medications prior to visit.          Objective:   Physical Exam Vitals:   09/16/24 1357  BP: 128/70  Pulse: 75  Temp: 97.8 F (36.6 C)  SpO2: 97%  Weight: 175 lb (79.4 kg)  Height: 5' 4 (1.626 m)   Gen: Pleasant, well-nourished, in no distress,  normal affect  ENT: No lesions,  mouth clear,  oropharynx clear, no postnasal drip  Neck: No JVD, no stridor  Lungs: No use of accessory muscles, no crackles or wheezing on normal respiration, no wheeze on forced expiration  Cardiovascular: RRR, heart sounds normal, no murmur or gallops, no peripheral edema  Musculoskeletal: No deformities, no cyanosis or clubbing  Neuro: alert, awake, non focal  Skin: Warm, no lesions or rash      Assessment & Plan:  Tobacco abuse 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  Her PFTs are not yet been done but we will perform these to quantify her degree of obstruction.  We can hold off on medication for now.  She does need to have a repeat CT scan of her chest in September, had a RADS 2 screening CT.  We talked about smoking cessation strategies in detail today.   It is good to see you today. Congratulations on decreasing your cigarettes. It is very important to continue to try to work on tobacco cessation.  We talked in detail about plans to begin to cut down your cigarette use.  If we can get down to around 5 cigarettes daily then we can start to think about strategies to quit completely.  We will continue to discuss. We will hold off on any inhaled medications for now. We reviewed your CT scan of the chest.  There were small stable pulmonary nodules low suspicion for any evidence for lung cancer.  Good news.  Get your next CT scan of the chest in September 2026 as planned. We will work on arranging pulmonary function testing Follow Dr. Shelah in about 3 months so we can review your PFT.  Smoking/Tobacco Cessation Counseling Alexandra Allen is a  current user of tobacco or nicotine products. She is considering quitting at this time. Counseling provided today addressed the risks of continued use and the benefits of cessation. Discussed tobacco/nicotine use history, readiness to quit, and evidence-based treatment options including behavioral strategies, support resources, and pharmacologic therapies. Provided encouragement and educational materials on steps and resources to quit smoking. Patient questions were addressed, and follow-up recommended for continued support. Total time spent on counseling: 12 minutes.    I personally spent a total of 36 minutes in the care of the patient today including preparing to see the patient, getting/reviewing separately obtained history, performing a medically appropriate exam/evaluation, counseling and educating, placing orders, documenting clinical information in the EHR, independently interpreting results, and communicating results.   Lamar Shelah, MD, PhD 09/16/2024, 2:38 PM [facility] [phone] or if no answer before 7:00PM call [phone] For any issues after 7:00PM please call eLink [phone]

## 2024-09-16 NOTE — Assessment & Plan Note (Addendum)
 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  Her PFTs are not yet been done but we will perform these to quantify her degree of obstruction.  We can hold off on medication for now.  She does need to have a repeat CT scan of her chest in September, had a RADS 2 screening CT.  We talked about smoking cessation strategies in detail today.   It is good to see you today. Congratulations on decreasing your cigarettes. It is very important to continue to try to work on tobacco cessation.  We talked in detail about plans to begin to cut down your cigarette use.  If we can get down to around 5 cigarettes daily then we can start to think about strategies to quit completely.  We will continue to discuss. We will hold off on any inhaled medications for now. We reviewed your CT scan of the chest.  There were small stable pulmonary nodules low suspicion for any evidence for lung cancer.  Good news.  Get your next CT scan of the chest in September 2026 as planned. We will work on arranging pulmonary function testing Follow Dr. Shelah in about 3 months so we can review your PFT.

## 2024-09-16 NOTE — Patient Instructions (Signed)
 It is good to see you today. Congratulations on decreasing your cigarettes. It is very important to continue to try to work on tobacco cessation.  We talked in detail about plans to begin to cut down your cigarette use.  If we can get down to around 5 cigarettes daily then we can start to think about strategies to quit completely.  We will continue to discuss. We will hold off on any inhaled medications for now. We reviewed your CT scan of the chest.  There were small stable pulmonary nodules low suspicion for any evidence for lung cancer.  Good news.  Get your next CT scan of the chest in September 2026 as planned. We will work on arranging pulmonary function testing Follow Dr. Shelah in about 3 months so we can review your PFT.

## 2024-09-21 DIAGNOSIS — E249 Cushing's syndrome, unspecified: Secondary | ICD-10-CM | POA: Diagnosis not present

## 2024-09-21 DIAGNOSIS — E279 Disorder of adrenal gland, unspecified: Secondary | ICD-10-CM | POA: Diagnosis not present

## 2024-09-22 DIAGNOSIS — E119 Type 2 diabetes mellitus without complications: Secondary | ICD-10-CM | POA: Diagnosis not present

## 2024-09-22 DIAGNOSIS — E782 Mixed hyperlipidemia: Secondary | ICD-10-CM | POA: Diagnosis not present

## 2024-09-23 DIAGNOSIS — F17210 Nicotine dependence, cigarettes, uncomplicated: Secondary | ICD-10-CM | POA: Diagnosis not present

## 2024-09-28 DIAGNOSIS — Z0000 Encounter for general adult medical examination without abnormal findings: Secondary | ICD-10-CM | POA: Diagnosis not present

## 2024-09-28 DIAGNOSIS — E119 Type 2 diabetes mellitus without complications: Secondary | ICD-10-CM | POA: Diagnosis not present

## 2024-09-28 DIAGNOSIS — E249 Cushing's syndrome, unspecified: Secondary | ICD-10-CM | POA: Diagnosis not present

## 2024-09-28 DIAGNOSIS — E042 Nontoxic multinodular goiter: Secondary | ICD-10-CM | POA: Diagnosis not present

## 2024-09-28 DIAGNOSIS — E782 Mixed hyperlipidemia: Secondary | ICD-10-CM | POA: Diagnosis not present

## 2024-09-30 DIAGNOSIS — E249 Cushing's syndrome, unspecified: Secondary | ICD-10-CM | POA: Diagnosis not present

## 2024-09-30 DIAGNOSIS — E279 Disorder of adrenal gland, unspecified: Secondary | ICD-10-CM | POA: Diagnosis not present

## 2024-12-22 ENCOUNTER — Ambulatory Visit: Admitting: Emergency Medicine

## 2024-12-22 ENCOUNTER — Encounter
